# Patient Record
Sex: Male | Born: 1980 | Race: Black or African American | Hispanic: No | Smoking: Current some day smoker
Health system: Southern US, Community
[De-identification: ages and names within clinical notes are randomized; demographics above are authoritative.]

## PROBLEM LIST (undated history)

## (undated) ENCOUNTER — Emergency Department (HOSPITAL_COMMUNITY): Discharge status: Absent without leave | Payer: No Typology Code available for payment source

## (undated) DIAGNOSIS — M1990 Unspecified osteoarthritis, unspecified site: Secondary | ICD-10-CM

## (undated) DIAGNOSIS — G4730 Sleep apnea, unspecified: Secondary | ICD-10-CM

## (undated) DIAGNOSIS — E119 Type 2 diabetes mellitus without complications: Secondary | ICD-10-CM

## (undated) DIAGNOSIS — E7800 Pure hypercholesterolemia, unspecified: Secondary | ICD-10-CM

## (undated) DIAGNOSIS — J189 Pneumonia, unspecified organism: Secondary | ICD-10-CM

## (undated) DIAGNOSIS — I10 Essential (primary) hypertension: Secondary | ICD-10-CM

## (undated) DIAGNOSIS — I639 Cerebral infarction, unspecified: Secondary | ICD-10-CM

## (undated) HISTORY — PX: KNEE SURGERY: SHX244

## (undated) HISTORY — PX: FOOT SURGERY: SHX648

## (undated) HISTORY — PX: ORTHOPEDIC SURGERY: SHX850

## (undated) HISTORY — PX: SHOULDER SURGERY: SHX246

## (undated) HISTORY — PX: ORIF RADIUS & ULNA FRACTURES: SHX2129

## (undated) HISTORY — PX: ORIF TIBIA & FIBULA FRACTURES: SHX2131

---
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
## 1998-11-19 ENCOUNTER — Encounter: Payer: Self-pay | Admitting: Emergency Medicine

## 1998-11-19 ENCOUNTER — Emergency Department (HOSPITAL_COMMUNITY): Admission: EM | Admit: 1998-11-19 | Discharge: 1998-11-19 | Payer: Self-pay | Admitting: Emergency Medicine

## 2002-02-13 HISTORY — PX: ORIF TIBIA & FIBULA FRACTURES: SHX2131

## 2002-02-13 HISTORY — PX: KNEE SURGERY: SHX244

## 2002-02-13 HISTORY — PX: ORIF RADIUS & ULNA FRACTURES: SHX2129

## 2002-02-13 HISTORY — PX: FOOT SURGERY: SHX648

## 2007-09-08 ENCOUNTER — Emergency Department (HOSPITAL_COMMUNITY): Admission: EM | Admit: 2007-09-08 | Discharge: 2007-09-08 | Payer: Self-pay | Admitting: Emergency Medicine

## 2008-04-21 ENCOUNTER — Emergency Department (HOSPITAL_COMMUNITY): Admission: EM | Admit: 2008-04-21 | Discharge: 2008-04-21 | Payer: Self-pay | Admitting: Family Medicine

## 2008-05-06 ENCOUNTER — Emergency Department (HOSPITAL_COMMUNITY): Admission: EM | Admit: 2008-05-06 | Discharge: 2008-05-06 | Payer: Self-pay | Admitting: Emergency Medicine

## 2008-05-08 ENCOUNTER — Ambulatory Visit: Payer: Self-pay | Admitting: Pulmonary Disease

## 2008-05-08 ENCOUNTER — Inpatient Hospital Stay (HOSPITAL_COMMUNITY): Admission: EM | Admit: 2008-05-08 | Discharge: 2008-05-15 | Payer: Self-pay | Admitting: Emergency Medicine

## 2008-05-09 ENCOUNTER — Encounter (INDEPENDENT_AMBULATORY_CARE_PROVIDER_SITE_OTHER): Payer: Self-pay | Admitting: Pulmonary Disease

## 2008-05-15 ENCOUNTER — Emergency Department (HOSPITAL_COMMUNITY): Admission: EM | Admit: 2008-05-15 | Discharge: 2008-05-15 | Payer: Self-pay | Admitting: Emergency Medicine

## 2008-05-31 ENCOUNTER — Emergency Department (HOSPITAL_COMMUNITY): Admission: EM | Admit: 2008-05-31 | Discharge: 2008-05-31 | Payer: Self-pay | Admitting: *Deleted

## 2008-11-06 ENCOUNTER — Emergency Department (HOSPITAL_COMMUNITY): Admission: EM | Admit: 2008-11-06 | Discharge: 2008-11-06 | Payer: Self-pay | Admitting: Emergency Medicine

## 2009-04-06 ENCOUNTER — Emergency Department (HOSPITAL_COMMUNITY): Admission: EM | Admit: 2009-04-06 | Discharge: 2009-04-06 | Payer: Self-pay | Admitting: Emergency Medicine

## 2009-06-24 ENCOUNTER — Emergency Department (HOSPITAL_COMMUNITY): Admission: EM | Admit: 2009-06-24 | Discharge: 2009-06-24 | Payer: Self-pay | Admitting: Family Medicine

## 2009-08-13 ENCOUNTER — Emergency Department (HOSPITAL_COMMUNITY): Admission: EM | Admit: 2009-08-13 | Discharge: 2009-08-13 | Payer: Self-pay | Admitting: Family Medicine

## 2010-02-13 DIAGNOSIS — G459 Transient cerebral ischemic attack, unspecified: Secondary | ICD-10-CM

## 2010-02-13 DIAGNOSIS — I639 Cerebral infarction, unspecified: Secondary | ICD-10-CM

## 2010-02-13 HISTORY — DX: Transient cerebral ischemic attack, unspecified: G45.9

## 2010-02-13 HISTORY — DX: Cerebral infarction, unspecified: I63.9

## 2010-05-21 ENCOUNTER — Inpatient Hospital Stay (INDEPENDENT_AMBULATORY_CARE_PROVIDER_SITE_OTHER)
Admission: RE | Admit: 2010-05-21 | Discharge: 2010-05-21 | Disposition: A | Discharge status: Home / Self Care | Payer: Self-pay | Source: Ambulatory Visit | Attending: Family Medicine | Admitting: Family Medicine

## 2010-05-21 ENCOUNTER — Ambulatory Visit (INDEPENDENT_AMBULATORY_CARE_PROVIDER_SITE_OTHER): Payer: Self-pay

## 2010-05-21 DIAGNOSIS — S91109A Unspecified open wound of unspecified toe(s) without damage to nail, initial encounter: Secondary | ICD-10-CM

## 2010-05-25 LAB — BASIC METABOLIC PANEL
CO2: 31 mEq/L (ref 19–32)
Chloride: 97 mEq/L (ref 96–112)
Creatinine, Ser: 1.12 mg/dL (ref 0.4–1.5)
GFR calc Af Amer: >60 mL/min (ref >60)
Glucose, Bld: 106 mg/dL — ABNORMAL HIGH (ref 70–99)

## 2010-05-25 LAB — CBC
MCHC: 34 g/dL (ref 30.0–36.0)
MCHC: 34.4 g/dL (ref 30.0–36.0)
MCV: 84.9 fL (ref 78.0–100.0)
MCV: 86.3 fL (ref 78.0–100.0)
Platelets: 326 10*3/uL (ref 150–400)
RBC: 4.84 MIL/uL (ref 4.22–5.81)
RDW: 14.1 % (ref 11.5–15.5)
WBC: 11.2 10*3/uL — ABNORMAL HIGH (ref 4.0–10.5)

## 2010-05-26 LAB — CSF CULTURE W GRAM STAIN: Culture: NO GROWTH

## 2010-05-26 LAB — LEGIONELLA PROFILE(CULTURE+DFA/SMEAR): Legionella Antigen (DFA): NEGATIVE

## 2010-05-26 LAB — CBC
HCT: 42 % (ref 39.0–52.0)
HCT: 42.8 % (ref 39.0–52.0)
Hemoglobin: 13.8 g/dL (ref 13.0–17.0)
Hemoglobin: 14 g/dL (ref 13.0–17.0)
Hemoglobin: 14.4 g/dL (ref 13.0–17.0)
Hemoglobin: 14.5 g/dL (ref 13.0–17.0)
MCHC: 33.9 g/dL (ref 30.0–36.0)
MCHC: 34.3 g/dL (ref 30.0–36.0)
Platelets: 278 10*3/uL (ref 150–400)
Platelets: 297 10*3/uL (ref 150–400)
RBC: 4.74 MIL/uL (ref 4.22–5.81)
RBC: 4.88 MIL/uL (ref 4.22–5.81)
RBC: 4.89 MIL/uL (ref 4.22–5.81)
RBC: 4.9 MIL/uL (ref 4.22–5.81)
RBC: 5.34 MIL/uL (ref 4.22–5.81)
RDW: 13.8 % (ref 11.5–15.5)
RDW: 14.1 % (ref 11.5–15.5)
WBC: 14.6 10*3/uL — ABNORMAL HIGH (ref 4.0–10.5)
WBC: 14.7 10*3/uL — ABNORMAL HIGH (ref 4.0–10.5)
WBC: 25.1 10*3/uL — ABNORMAL HIGH (ref 4.0–10.5)

## 2010-05-26 LAB — BLOOD GAS, ARTERIAL
Acid-base deficit: 0.5 mmol/L (ref 0.0–2.0)
TCO2: 24.5 mmol/L (ref 0–100)
pCO2 arterial: 36.5 mmHg (ref 35.0–45.0)
pO2, Arterial: 48.5 mmHg — ABNORMAL LOW (ref 80.0–100.0)

## 2010-05-26 LAB — EXPECTORATED SPUTUM ASSESSMENT W GRAM STAIN, RFLX TO RESP C

## 2010-05-26 LAB — COMPREHENSIVE METABOLIC PANEL
Alkaline Phosphatase: 76 U/L (ref 39–117)
BUN: 7 mg/dL (ref 6–23)
GFR calc non Af Amer: >60 mL/min (ref >60)
Glucose, Bld: 107 mg/dL — ABNORMAL HIGH (ref 70–99)
Potassium: 3.8 mEq/L (ref 3.5–5.1)
Total Protein: 6.4 g/dL (ref 6.0–8.3)

## 2010-05-26 LAB — CULTURE, BLOOD (ROUTINE X 2)

## 2010-05-26 LAB — PROTIME-INR: INR: 1.1 (ref 0.00–1.49)

## 2010-05-26 LAB — HSV PCR
HSV 2 , PCR: NOT DETECTED
HSV, PCR: NOT DETECTED

## 2010-05-26 LAB — GRAM STAIN

## 2010-05-26 LAB — CULTURE, ROUTINE-ABSCESS: Culture: NO GROWTH

## 2010-05-26 LAB — BASIC METABOLIC PANEL
CO2: 28 mEq/L (ref 19–32)
CO2: 30 mEq/L (ref 19–32)
Calcium: 8.8 mg/dL (ref 8.4–10.5)
Calcium: 8.8 mg/dL (ref 8.4–10.5)
Calcium: 8.9 mg/dL (ref 8.4–10.5)
Creatinine, Ser: 1.03 mg/dL (ref 0.4–1.5)
Creatinine, Ser: 1.09 mg/dL (ref 0.4–1.5)
GFR calc Af Amer: >60 mL/min (ref >60)
GFR calc Af Amer: >60 mL/min (ref >60)
GFR calc Af Amer: >60 mL/min (ref >60)
GFR calc non Af Amer: >60 mL/min (ref >60)
Sodium: 137 mEq/L (ref 135–145)

## 2010-05-26 LAB — URINALYSIS, ROUTINE W REFLEX MICROSCOPIC
Glucose, UA: NEGATIVE mg/dL
Leukocytes, UA: NEGATIVE
Specific Gravity, Urine: 1.024 (ref 1.005–1.030)
pH: 8 (ref 5.0–8.0)

## 2010-05-26 LAB — URINE MICROSCOPIC-ADD ON

## 2010-05-26 LAB — APTT: aPTT: 39 seconds — ABNORMAL HIGH (ref 24–37)

## 2010-05-26 LAB — ANAEROBIC CULTURE

## 2010-05-26 LAB — CSF CELL COUNT WITH DIFFERENTIAL
Lymphs, CSF: 56 % (ref 40–80)
Monocyte-Macrophage-Spinal Fluid: 38 % (ref 15–45)
RBC Count, CSF: 1 /mm3 — ABNORMAL HIGH
Tube #: 3

## 2010-05-26 LAB — MAGNESIUM: Magnesium: 1.9 mg/dL (ref 1.5–2.5)

## 2010-05-26 LAB — CULTURE, RESPIRATORY W GRAM STAIN

## 2010-05-26 LAB — HEPARIN LEVEL (UNFRACTIONATED): Heparin Unfractionated: <0.1 IU/mL — ABNORMAL LOW (ref 0.30–0.70)

## 2010-05-26 LAB — VANCOMYCIN, TROUGH: Vancomycin Tr: 8 ug/mL — ABNORMAL LOW (ref 10.0–20.0)

## 2010-05-26 LAB — BRAIN NATRIURETIC PEPTIDE: Pro B Natriuretic peptide (BNP): 208 pg/mL — ABNORMAL HIGH (ref 0.0–100.0)

## 2010-06-28 NOTE — Consult Note (Signed)
NAME:  [name] [name], [name] [name]            ACCOUNT NO.:  [id number]   MEDICAL RECORD NO.:  [id number]          PATIENT TYPE:  INP   LOCATION:  6705                         FACILITY:  [facility]   PHYSICIAN:  Cherylynn Ridges, M.D.    DATE OF BIRTH:  [birth year]   DATE OF CONSULTATION:  05/08/2008  DATE OF DISCHARGE:                                 CONSULTATION   CONSULTING SURGEON:  Dr. Lindie Spruce.   REQUESTING PHYSICIAN:  Charlestine Massed, MD, [facility].   REASON FOR CONSULTATION:  Right groin abscess.   HISTORY PRESENT ILLNESS:  [name] [name] is a 30 year old male patient  with history of sleep apnea and obesity.  He has recently been admitted  in the past 24 hours with complaints of nausea, vomiting, sepsis, back  pain, and plans to rule out meningitis.  In addition, he reports a 4-day  history of right groin pain with redness and fullness area that began  like a small pimple.  He had progressive increase in symptoms including  increasing pain and swelling in the right groin scrotal and perineal  area.  The patient was empirically placed on vancomycin and Rocephin to  cover possible sepsis and meningitis.  In the interim, in the past 24  hours, he has had a T-max of 102 degrees fever despite being on  antibiotics, and he has begun having spontaneous purulent drainage from  the perineum.  When he was admitted, his white count was 25,000 and  despite antibiotics he is up to 26,000.  Today, he continues with fever  and increased pain, and he is having persistent nausea, vomiting, and  has been unable to eat for 24 hours.  Surgical consultation has been  requested.   REVIEW OF SYSTEMS:  As per the history of present illness.  CONSTITUTIONAL:  The patient has never had symptoms like this before.  Otherwise, all review of system categories are negative or  noncontributory to this exam.   PAST MEDICAL HISTORY:  Sleep apnea, currently not on nocturnal CPAP.   PAST SURGICAL HISTORY:  The  patient reports several years ago, repair of  shattered left wrist and ankle, and reports it was related to sports  injury.   SOCIAL HISTORY:  Admits to tobacco use, rare social alcohol.  He is  married.  He has children and he works as a Optometrist.   ALLERGIES:  NKDA.   CURRENT MEDICATIONS SINCE ARRIVAL:  Morphine, Tylenol, Zofran, Protonix,  Senokot, vancomycin, ceftriaxone, and various other medications for  symptom management.   PHYSICAL EXAMINATION:  GENERAL:  Mildly toxic-appearing male patient,  somewhat lethargic, complaining of severe right groin and scrotal pain.  VITAL SIGNS:  T-max today, 102.4, current temperature is 100.3; BP is  136/85; pulse is 101 and regular; respirations are 20.  PSYCH:  The patient is arousable and alert when stimulated, but is very  groggy due to fever and pain.  His affect is appropriate given current  situation.  NEUROLOGIC:  Cranial nerves II through XII are grossly intact.  He is  moving all extremities x4.  No obvious focal neurological deficits.  EYES:  Sclerae  are slightly injected.  EARS, NOSE, AND THROAT:  Ears are symmetric with no otorrhea.  Nose is  midline.  No rhinorrhea.  Oral mucous membranes are pink, but dry.  CHEST:  Bilateral lung sounds are clear to auscultation, diminished in  the bases.  Respiratory effort is nonlabored.  He is on room air.  CARDIOVASCULAR:  Heart sounds are S1 and S2 without obvious rubs,  murmurs, thrills, or gallops.  No JVD.  No peripheral edema.  ABDOMEN:  Obese and soft, and nontender.  Bowel sounds are present.  No  obvious hepatosplenomegaly masses, bruits, or hernias.  GENITOURINARY:  The patient's external genitalia are normal in  appearance, except for the following.  In the right groin area, there is  significant cellulitic and redness skin changes with fluctuance, this  extends down towards the scrotum, although the fluctuance and the  swelling is less noticed in the scrotum.  The scrotum  itself is  exquisitely tender with manipulation.  There is induration in the  perineal area as well as tan-purulent drainage coming out of a tiny  pinhole spontaneous opening.  Erythema also extends down into the  scrotal and perineal areas.  He is exquisitely tender again with  palpation over these areas, especially in the scrotal region.  EXTREMITIES:  Symmetrical in appearance without cyanosis or clubbing.   LABORATORY DATA:  White count 26,000, hemoglobin 14.5, and platelets  215,000.  Sodium 136, potassium 3.8, CO2 of 23, glucose 107, BUN 7, and  creatinine 1.08.  LFTs are normal.   DIAGNOSTICS:  The patient underwent a CT of the abdomen and pelvis,  which in regards to the abscess process basically only shows cellulitic  changes in the soft tissue in the right groin area with no obvious fluid  or abscess collection.   IMPRESSION:  1. 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  2. Sleep apnea.   PLAN:  1. Dr. Lindie Spruce has interviewed and examined the patient.  It is      imperative patient is sent to the OR today for incision and      drainage of this above-mentioned area.  2. The patient is mainly on Gram-positive coverage.  We will go ahead      and add Gram-negative coverage with Flagyl, and postoperatively,      pending findings we may adjust antibiotics and/or await for any      cultures obtained in the intraoperative setting.  3. The patient has been made n.p.o.  We will go ahead and make sure he      has IV Dilaudid ordered while he is n.p.o.       Allison L. Rennis Harding, N.P.      Cherylynn Ridges, M.D.  Electronically Signed   ALE/MEDQ  D:  05/08/2008  T:  05/09/2008  Job:  [id number]

## 2010-06-28 NOTE — H&P (Signed)
NAME:  [name] [name], [name] [name]            ACCOUNT NO.:  [id number]   MEDICAL RECORD NO.:  [id number]          PATIENT TYPE:  INP   LOCATION:  6705                         FACILITY:  [facility]   PHYSICIAN:  Vania Rea, M.D. DATE OF BIRTH:  06/16/1980   DATE OF ADMISSION:  05/08/2008  DATE OF DISCHARGE:                              HISTORY & PHYSICAL   PRIMARY CARE PHYSICIAN:  Unassigned.   CHIEF COMPLAINT:  Headache and back pain.   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  The patient visited Urgent Care  where he was diagnosed with upper respiratory symptoms, apparently  seasonal allergies.  Was given doxycycline and sent home, but has been  getting progressively worse.  Returned to the emergency room today and  was noted to be having a low-grade fever and had a small scrotal  abscess.  The emergency room physician went ahead and drained the  scrotal abscess and consulted hospitalist service for sepsis of unknown  cause.  Currently, the patient is complaining of severe headache which  he says may be related to his migraine but 10 times worse.  Also,  photophobia.  He had no neck stiffness.  He vomited Tylenol which he has  been given and continues to complain of low back pain.  He denied any  neurological deficits.  Denied any contacts with sick persons.  He no  longer has diarrhea.   PAST MEDICAL HISTORY:  Nothing significant.   MEDICATIONS:  1. Doxycycline twice daily, recently prescribed.  2. Tramadol p.r.n. for recent broken tooth.  3. Tylenol #3 p.r.n.   ALLERGIES:  NO KNOWN DRUG ALLERGIES.   SOCIAL HISTORY:  Smokes one cigar every 2-3 days, alcohol occasionally.  Denies illicit drug use.  Former [employer] in high school and  college.   FAMILY HISTORY:  Significant  only for asthma, but otherwise, denied any  family history of any medical problems.   REVIEW OF SYSTEMS:  Other than noted above, a 10-point review of systems  is unremarkable.  Denies dyspnea or frequency.   PHYSICAL EXAMINATION:  GENERAL:  Obese, young, African American  gentleman, lying flat in the bed, clearly distressed by pain, headache,  complaining of girth.  VITAL SIGNS:  Temperature is now 102.8, pulse 104, respirations 20,  blood pressure 154/96.  He is saturating 99% on room air.  HEENT:  Pupils are round and equal.  He does have photophobia.  He has  no neck stiffness.  No cervical lymphadenopathy or thyromegaly.  Ear  drum is normal.  It is not inflamed.  CHEST:  Clear to auscultation bilaterally.  CARDIOVASCULAR:  Tachycardic.  No murmurs.  ABDOMEN:  Massively obese, but soft and nontender.  In his groin area,  he has extensive inflammation in the right perineal area between the  thigh and the scrotum, and he is status post drainage of a small pimple  in the right scrotum.  He has no  penile discharge.  EXTREMITIES:  Without edema.  He has 3+ dorsalis pedis pulses  bilaterally.  CNS:  Cranial nerves II-XII grossly intact.  No focal lateralizing  signs.   LABORATORY DATA:  White count is 25,000, hemoglobin 15.6, platelets 235.  Serum chemistry remarkable for a sodium slightly low at 133, potassium  3.6, chloride 101, CO2 23, glucose 124, BUN 7, creatinine 1.09.  Urinalysis is unremarkable.  Chest shows minimal chronic bronchitic  changes.  No acute abnormalities.   ASSESSMENT:  1. Sepsis.  2. Questionable acute meningitis.  3. Possible spinal abscess versus diskitis.  4. Scrotal abscess status post drainage.   PLAN:  Will get a septic workup including blood cultures.  The patient  has been ordered a pelvic CT by the emergency room physician.  Since he  will be getting a contrast, we will add lumbar CT to look for spinal  abscess, but in all likelihood in this  gentleman, if the CT is negative  we will need a lumbar puncture, and if that is negative, an MRI of the  spine.  In any case, we will start him on CNS doses of Rocephin and  vancomycin.  Other plans as per orders.      Vania Rea, M.D.  Electronically Signed     LC/MEDQ  D:  05/08/2008  T:  05/08/2008  Job:  [id number]

## 2010-06-28 NOTE — Group Therapy Note (Signed)
NAME:  [name], [name]            ACCOUNT NO.:  [id number]   MEDICAL RECORD NO.:  [id number]          PATIENT TYPE:  INP   LOCATION:  4742                         FACILITY:  [facility]   PHYSICIAN:  Charlestine Massed, MDDATE OF BIRTH:  04-08-1980                                 PROGRESS NOTE   PRIMARY CARE PHYSICIAN:  Unassigned.   REASON FOR ADMISSION:  Headache and pain in the right groin.   CURRENT DIAGNOSIS:  1. Right groin abscess.  2. Sepsis secondary to abscess and pneumonia.  3. Bilateral pneumonia with rusty sputum.  4. Morbid obesity with questionable obstructive sleep apnea and early      morning headache and daytime somnolence.  5. Hypoxemia secondary to pneumonia.  6. Acute pulmonary edema with high blood pressure yesterday, one      episode, currently resolved.   CURRENT MEDICATIONS:  1. Azithromycin 5 mg once daily.  2. Rocephin 1 gram once daily.  3. Lasix 20 mg IV q.8 hours.  4. Protonix 40 mg daily.  5. Senna 1 tablet p.o. at bedtime.  6. Tylenol No. 3 two tablets p.o. q.6 hours p.r.n.  7. Flagyl 500 mg q.8 hours.  8. Heparin for DVT prophylaxis.   HOSPITAL COURSE BY PROBLEMS:  1. Right groin abscess.  The patient came in with complaints of pain      in the right groin.  Initially the abscess was drained in the ER by      the ED physician.  He also had complaints of photophobia and      headache; so, the patient was initially started on IV antibiotics      with IV vancomycin.  The next day when I saw the patient, I called      surgery.  Surgery took the patient to the OR for further drainage.      They found that the patient has a perineal abscess with sepsis and      incision and drainage of the necrotizing soft infection of the      right scrotum was done.  They opinioned that the patient possibly      has a Fournier's cellulitis with abscess which was drained and a      gauze dressing was placed.  The patient was followed up by surgery      today and  they plan for re-exploration of the abscess as the      patient is continuing to have more drainage at the wound.  They      plan for further re-exploration tomorrow in the a.m.  He was      continued on antibiotics.  2. Meningitis ruled out.  The patient came with report of a morning      headache, was suspected to have meningitis and had a lumbar      puncture done which with only 1 WBC in the count; so, the patient      was ruled out for meningitis and droplet precautions removed.  3. Pneumonia.  The patient developed onset of shortness of breath with      cough and rusty sputum and was  hypoxic yesterday morning; so, the      patient was transferred to the ICU wanted to like to shortness of      breath and was put on high flow oxygen.  The  hypoxemia improved      and was he was continued on IV antibiotics of Rocephin and started      on Zithromax also and continued on other antibiotics.  The patient      has improved considerably and has been transferred out of the ICU.      CCU and pulmonary system consult was done.  There is no need for      BiPAP and he will continue on nasal oxygen, continue on IV      antibiotics and Pneumovax and flu vaccine to be given at the time      of discharge.  4. Morbid obesity.  The patient currently has morbid obesity.  He has      possible obstructive sleep apnea as he is giving signs and signs of      morning headaches and daytime somnolence.  The patient needs to      have a sleep study as an outpatient to be qualified for CPAP      machine at home.  The patient may also benefit from bariatric      surgery which needs to be referred after the patient is discharged.      Currently, we will put the patient on the Pap at night to see if      that resolves his headache and he needs to be referred to      Pulmonology at the time of discharge.  5. Acute pulmonary edema on chest x-rays today with very high blood      pressures recorded yesterday.  The  patient had one episode of high      blood pressure.  Currently, the patient is continued on Lasix 20 mg      q.8 hours for his pulmonary edema.  It has resolved considerably.      The patient's blood pressure needs to be controlled with      antihypertensives.   DISPOSITION:  1. Currently, the patient is awaiting and surgical exploration of the      groin abscess tomorrow.  2. PE has been ruled out and off heparin drip.  3. Continue antibiotics and follow surgery advice with regards to the      abscess.      Charlestine Massed, MD  Electronically Signed     UT/MEDQ  D:  05/10/2008  T:  05/10/2008  Job:  [id number]

## 2010-06-28 NOTE — Op Note (Signed)
NAME:  [name] [name], [name] [name]            ACCOUNT NO.:  [id number]   MEDICAL RECORD NO.:  [id number]          PATIENT TYPE:  INP   LOCATION:  6705                         FACILITY:  [facility]   PHYSICIAN:  Cherylynn Ridges, M.D.    DATE OF BIRTH:  11-13-1980   DATE OF PROCEDURE:  05/08/2008  DATE OF DISCHARGE:                               OPERATIVE REPORT   PREOPERATIVE DIAGNOSIS:  Perineal abscess with sepsis.   POSTOPERATIVE DIAGNOSIS:  Right scrotal and perineal abscess with  necrotizing soft tissue infection.   PROCEDURE:  Incision and drainage of right scrotal and perineal abscess.   SURGEON:  Cherylynn Ridges, M.D.   ANESTHESIA:  General endotracheal.   ESTIMATED BLOOD LOSS:  Less than 20 mL.   COMPLICATIONS:  None.   CONDITION:  Stable.   FINDINGS:  Necrotizing soft tissue infection of the right scrotum and  perineum.   INDICATION FOR OPERATION:  The patient is an otherwise healthy 30-year-  old large male with fevers, chills, white count of 26,000 who comes in  now with draining abscess of his right groin and scrotum.   OPERATION:  The patient was taken to the operating room, placed on table  in supine  position.  After an adequate general endotracheal anesthetic  was administered, he was placed in the lithotomy position and prepped  and draped in usual sterile manner with scrotum taped to the left thigh  to expose the draining right scrotal and perineal abscess.   The opening of the draining sinus was in the posterior aspect and  lateral wall of the scrotum.  It extended up to the mons portion of the  pubis.  We opened this area to about 1-1/2 inches and then bluntly  opened up the soft tissue infection, draining a large amount of bloody,  watery, light-brown pus, which was foul smelling.  We irrigated it with  about 500 mL of saline then we packed it with an entire bottle of 1 inch  iodoform gauze and entire bottle of 0.5 inch iodoform gauze.  Cultures  were sent, aerobic and  anaerobic.  Dressings were applied including 4 x  4's and ABDs.  All counts were correct.      Cherylynn Ridges, M.D.  Electronically Signed    JOW/MEDQ  D:  05/08/2008  T:  05/09/2008  Job:  [id number]

## 2010-06-28 NOTE — Discharge Summary (Signed)
NAME:  [name], [name]            ACCOUNT NO.:  [id number]   MEDICAL RECORD NO.:  [id number]           PATIENT TYPE:   LOCATION:                                 FACILITY:   PHYSICIAN:  Beckey Rutter, MD  DATE OF BIRTH:  25-Apr-1980   DATE OF ADMISSION:  DATE OF DISCHARGE:                               DISCHARGE SUMMARY   Please refer to the previously dictated discharge summary for more  details regarding hospital course.   The patient is status post second drainage by surgical team.  Today, the  patient was cleared for discharge.  I will discharge the patient on pain  medication as well as antibiotic.   DISCHARGE DIAGNOSIS:  Please refer to the previously dictated discharge  summary for discharge diagnosis.   DISCHARGE MEDICATIONS:  1. Hydromorphone/Dilaudid 2-4 mg p.o. b.i.d.  2. Colace 1 tablet p.o. nightly, hold for loose stool.  3. Percocet 2 tablets p.o. q.6 h. p.r.n.  4. Advair 1 puff b.i.d.  5. Augmentin 875 mg p.o. b.i.d. for 5 more days.   The patient is stable for discharge today.  He should follow up with  [facility] as indicated and discussed with him.  The  patient will have home health RN for daily dressing.      Beckey Rutter, MD  Electronically Signed     EME/MEDQ  D:  05/15/2008  T:  05/16/2008  Job:  [id number]

## 2010-06-28 NOTE — Op Note (Signed)
NAME:  Hulbert, Bhavik            ACCOUNT NO.:  [id number]   MEDICAL RECORD NO.:  [id number]          PATIENT TYPE:  INP   LOCATION:  5002                         FACILITY:  [facility]   PHYSICIAN:  Lennie Muckle, MD      DATE OF BIRTH:  Mar 30, 1980   DATE OF PROCEDURE:  05/11/2008  DATE OF DISCHARGE:                               OPERATIVE REPORT   PREOPERATIVE DIAGNOSIS:  Right groin abscess.   POSTOPERATIVE DIAGNOSIS:  Right groin abscess.   PROCEDURE:  Dressing change under anesthesia, debridement of necrotic  tissue and extension of an incision of abscess cavity.   BLOOD LOSS:  Minimal.   DRAINS:  No drains were placed.   Wound was packed with gauze.   INDICATION FOR PROCEDURE:  Mr. Reesor is a 30 year old male who  performed incision and drainage of a right groin abscess by Dr. Jimmye Norman on May 08, 2008.  There was purulent fluid expressed.  This was  deeply packed with iodoform gauze.  He continued to have some drainage  from the area and having pain.  Therefore, it was discussed with the  patient to perform further extension on decision and debridement of  tissues as needed.  Informed consent was obtained prior to procedure.   DETAILS OF PROCEDURE:  Mr. Sar was identified in preoperative  holding area and marked as right groin.  All questions were answered, we  then taken him to the operating room where he was placed in the supine  position.  After administration of general endotracheal anesthesia, he  was placed in lithotomy position.  His right groin and scrotum were  prepped and draped in usual sterile fashion.  A time-out procedure  indicating patient and procedure were performed.  I removed the packing,  there was some exudative material noted.  The wound tracked  approximately 10-12 cm superiorly.  Using a #10 blade, I opened the  incision superiorly.  Bleeding was controlled with electrocautery.  Continued opening the wound bed and investigating the  wound, a small  amount of necrotic area was debrided with the electrocautery.  A small  bleeding vessel was controlled with the 3-0 Vicryl suture.  I irrigated  the wound bed, it seemed like it needed no further debridement and  therefore I packed it with a moist gauze.  He was then extubated and  transferred to postanesthesia care unit in stable condition.  They will  need twice daily dressing changes and pulse lavage daily.  Continue to  monitor his wound and take to the operating room as needed.      Lennie Muckle, MD  Electronically Signed    ALA/MEDQ  D:  05/11/2008  T:  05/12/2008  Job:  [id number]

## 2010-07-18 ENCOUNTER — Inpatient Hospital Stay (HOSPITAL_COMMUNITY)
Admission: EM | Admit: 2010-07-18 | Discharge: 2010-07-21 | DRG: 305 | Disposition: A | Discharge status: Home / Self Care | Payer: Medicaid Other | Attending: Internal Medicine | Admitting: Internal Medicine

## 2010-07-18 DIAGNOSIS — I10 Essential (primary) hypertension: Principal | ICD-10-CM | POA: Diagnosis present

## 2010-07-18 DIAGNOSIS — E871 Hypo-osmolality and hyponatremia: Secondary | ICD-10-CM | POA: Diagnosis present

## 2010-07-18 DIAGNOSIS — R55 Syncope and collapse: Secondary | ICD-10-CM | POA: Diagnosis present

## 2010-07-18 DIAGNOSIS — E785 Hyperlipidemia, unspecified: Secondary | ICD-10-CM | POA: Diagnosis present

## 2010-07-18 DIAGNOSIS — R799 Abnormal finding of blood chemistry, unspecified: Secondary | ICD-10-CM | POA: Diagnosis present

## 2010-07-18 DIAGNOSIS — G43909 Migraine, unspecified, not intractable, without status migrainosus: Secondary | ICD-10-CM | POA: Diagnosis present

## 2010-07-18 DIAGNOSIS — G459 Transient cerebral ischemic attack, unspecified: Secondary | ICD-10-CM | POA: Diagnosis present

## 2010-07-18 DIAGNOSIS — F17200 Nicotine dependence, unspecified, uncomplicated: Secondary | ICD-10-CM | POA: Diagnosis present

## 2010-07-19 ENCOUNTER — Inpatient Hospital Stay (HOSPITAL_COMMUNITY): Payer: Medicaid Other

## 2010-07-19 ENCOUNTER — Encounter (HOSPITAL_COMMUNITY): Payer: Self-pay

## 2010-07-19 ENCOUNTER — Emergency Department (HOSPITAL_COMMUNITY): Payer: Medicaid Other

## 2010-07-19 LAB — CK TOTAL AND CKMB (NOT AT ARMC)
CK, MB: 1.4 ng/mL (ref 0.3–4.0)
Relative Index: 0.7 (ref 0.0–2.5)
Total CK: 211 U/L (ref 7–232)

## 2010-07-19 LAB — BASIC METABOLIC PANEL
BUN: 10 mg/dL (ref 6–23)
CO2: 24 mEq/L (ref 19–32)
Chloride: 99 mEq/L (ref 96–112)
GFR calc non Af Amer: >60 mL/min (ref >60)
Glucose, Bld: 126 mg/dL — ABNORMAL HIGH (ref 70–99)
Potassium: 4.3 mEq/L (ref 3.5–5.1)
Sodium: 134 mEq/L — ABNORMAL LOW (ref 135–145)

## 2010-07-19 LAB — CBC
HCT: 44.8 % (ref 39.0–52.0)
MCH: 27.4 pg (ref 26.0–34.0)
MCV: 83 fL (ref 78.0–100.0)
RDW: 14 % (ref 11.5–15.5)
WBC: 9.6 10*3/uL (ref 4.0–10.5)

## 2010-07-19 LAB — URINALYSIS, ROUTINE W REFLEX MICROSCOPIC
Glucose, UA: NEGATIVE mg/dL
Hgb urine dipstick: NEGATIVE
Ketones, ur: NEGATIVE mg/dL
pH: 5 (ref 5.0–8.0)

## 2010-07-19 LAB — RAPID URINE DRUG SCREEN, HOSP PERFORMED
Amphetamines: NOT DETECTED
Barbiturates: NOT DETECTED
Benzodiazepines: NOT DETECTED
Cocaine: NOT DETECTED

## 2010-07-19 LAB — CARDIAC PANEL(CRET KIN+CKTOT+MB+TROPI)
CK, MB: 1.4 ng/mL (ref 0.3–4.0)
Total CK: 201 U/L (ref 7–232)
Troponin I: <0.3 ng/mL (ref <0.30)

## 2010-07-19 LAB — LIPID PANEL
Total CHOL/HDL Ratio: 5 RATIO
VLDL: 22 mg/dL (ref 0–40)

## 2010-07-19 LAB — DIFFERENTIAL
Eosinophils Absolute: 0.3 10*3/uL (ref 0.0–0.7)
Eosinophils Relative: 3 % (ref 0–5)
Lymphocytes Relative: 30 % (ref 12–46)
Lymphs Abs: 2.9 10*3/uL (ref 0.7–4.0)
Monocytes Relative: 10 % (ref 3–12)

## 2010-07-19 LAB — TROPONIN I: Troponin I: <0.3 ng/mL (ref <0.30)

## 2010-07-19 LAB — PRO B NATRIURETIC PEPTIDE: Pro B Natriuretic peptide (BNP): 5 pg/mL (ref 0–125)

## 2010-07-20 ENCOUNTER — Inpatient Hospital Stay (HOSPITAL_COMMUNITY): Payer: Medicaid Other

## 2010-07-20 DIAGNOSIS — R55 Syncope and collapse: Secondary | ICD-10-CM

## 2010-07-20 LAB — COMPREHENSIVE METABOLIC PANEL
ALT: 68 U/L — ABNORMAL HIGH (ref 0–53)
Alkaline Phosphatase: 83 U/L (ref 39–117)
Glucose, Bld: 95 mg/dL (ref 70–99)
Potassium: 3.6 mEq/L (ref 3.5–5.1)
Sodium: 136 mEq/L (ref 135–145)
Total Protein: 6.2 g/dL (ref 6.0–8.3)

## 2010-07-20 LAB — CBC
MCH: 27.3 pg (ref 26.0–34.0)
MCHC: 32.5 g/dL (ref 30.0–36.0)
MCV: 84.1 fL (ref 78.0–100.0)
Platelets: 223 10*3/uL (ref 150–400)
RBC: 5.16 MIL/uL (ref 4.22–5.81)
RDW: 14.2 % (ref 11.5–15.5)

## 2010-07-20 LAB — CARDIAC PANEL(CRET KIN+CKTOT+MB+TROPI)
CK, MB: 1.8 ng/mL (ref 0.3–4.0)
Troponin I: <0.3 ng/mL (ref <0.30)

## 2010-07-20 LAB — HEMOGLOBIN A1C
Hgb A1c MFr Bld: 6.3 % — ABNORMAL HIGH (ref <5.7)
Mean Plasma Glucose: 134 mg/dL — ABNORMAL HIGH (ref <117)

## 2010-07-21 ENCOUNTER — Inpatient Hospital Stay (HOSPITAL_COMMUNITY): Payer: Medicaid Other

## 2010-07-21 LAB — CBC
MCH: 27.1 pg (ref 26.0–34.0)
MCHC: 32.1 g/dL (ref 30.0–36.0)
MCV: 84.4 fL (ref 78.0–100.0)
Platelets: 239 10*3/uL (ref 150–400)
RDW: 14.1 % (ref 11.5–15.5)

## 2010-07-21 LAB — BASIC METABOLIC PANEL
BUN: 7 mg/dL (ref 6–23)
Calcium: 8.7 mg/dL (ref 8.4–10.5)
Chloride: 99 mEq/L (ref 96–112)
Creatinine, Ser: 0.91 mg/dL (ref 0.4–1.5)
GFR calc Af Amer: >60 mL/min (ref >60)
GFR calc non Af Amer: >60 mL/min (ref >60)

## 2010-07-26 NOTE — Discharge Summary (Signed)
NAME:  [name] [name], [name] [name]            ACCOUNT NO.:  [id number]  MEDICAL RECORD NO.:  [id number]  LOCATION:  1516                         FACILITY:  [facility]  PHYSICIAN:  Hillery Aldo, M.D.   DATE OF BIRTH:  1980-06-02  DATE OF ADMISSION:  07/18/2010 DATE OF DISCHARGE:  07/21/2010                              DISCHARGE SUMMARY   PRIMARY CARE PHYSICIAN:  None.  The patient is being referred to Dr. Lonia Blood for hospital followup and to establish care.  DISCHARGE DIAGNOSES: 1. 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 2. Syncope. 3. Probable transient ischemia attack related to hypertensive     emergency/accelerated hypertension. 4. Headaches. 5. Morbid obesity. 6. Mild dyslipidemia. 7. Mild elevated hemoglobin A1c/prediabetes. 8. Tobacco abuse. 9. Transient hyponatremia. 10.History of asthma.  CONSULTATIONS:  Registered dietitian, physical therapy, occupational therapy.  BRIEF ADMISSION HPI:  The patient is a 30 year old male who had a syncopal event that was witnessed by a friend.  Essentially sat down, slumped over and passed out for almost an hour.  The friend thought he was tired and was sleeping.  However, when he woke up, he was noted to have left-sided facial numbness which was persistent and therefore, he presented to the emergency department for further evaluation.  PROCEDURES AND DIAGNOSTIC STUDIES: 1. CT scan of the head on Jun 20, 2010 showed no evidence of acute     intracranial hemorrhage, mass lesion, or acute infarct. 2. Repeat CT scan of the head on Jun 20, 2010 showed no acute     intracranial abnormality. 3. Two-dimensional echocardiogram on July 19, 2010 showed moderate     concentric hypertrophy of the left ventricle with vigorous systolic     function and ejection fraction estimated at 65% to 70%.  No     regional wall motion abnormality.  Tissue Doppler parameters were     likewise normal.  No embolic  source of embolism was identified. 4. Carotid Dopplers on July 20, 2010 showed no ICA stenosis bilaterally     on the preliminary report.  Final report is pending.  DISCHARGE LABORATORY VALUES:  Sodium was 138, potassium 3.8, chloride 99, bicarb 30, BUN 7, creatinine 0.91, glucose 104, calcium 8.7.  White blood cell count was 10.2, hemoglobin 14.6, hematocrit 45.5, platelets 239,000.  Hemoglobin A1c was mildly elevated at 6.3.  Cardiac markers showed nonspecific elevation of total CK of 322 at its highest value. CK-MB and troponins were negative.  TSH was 1.691.  Lipids showed a cholesterol of 155, triglycerides 109, HDL 31, LDL 102.  Urine drug screen was positive for opiates.  HOSPITAL COURSE BY PROBLEM: 1. 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  This precipitously dropped his blood pressure and     subsequently was discontinued.  His blood pressure again trended up     and he subsequently was put on Norvasc for blood pressure control.     Blood pressures have ranged from 145 to 175 systolic over 89 to 112     diastolic, on Norvasc.  This point, the patient is stable for  discharge but needs an affordable antihypertensive regimen and     therefore will be discharged on Cardizem and hydrochlorothiazide     for blood pressure control.  He has been advised that it is     important for him to follow up with Dr. Mikeal Hawthorne for ongoing blood     pressure control management and to ensure that his blood pressure     is well-controlled on this new regimen.  He has likewise been     advised that these medications are available for 4 dollars a month     at the [facility]. 2. Syncope:  Unclear if this was truly a syncopal event or if he in     fact did fall asleep.  There is no evidence of stroke on serial CT     scans.  Unfortunately, the patient was too large to undergo MRI      screening.  He has had no further syncopal-type episodes here in     the hospital. 3. Probable TIA:  The patient's TIA was likely triggered by     hypertensive emergency.  Serial CT scans were performed since he     could not have an MRI and these were negative.  The full stroke     evaluation with carotid Dopplers and two-dimensional echocardiogram     were otherwise unrevealing.  This point, the patient will be     discharged on aspirin and blood pressure control medications as     well as lipid control medications.  He has been advised of his risk     factors for stroke and instructed that he will need to control his     risk factors to prevent further problems with stroke. 4. Headache:  Questionable migraines versus headache from high blood     pressure.  The patient was treated with a combination of Ultram and     IV pain medications.  He is requesting additional pain medication     at discharge and will be discharged with 30-tablet prescription for     5 mg oxycodone.  If he continues to have persistent headaches,     further neurological evaluation for migraine headaches may be     warranted. 5. Morbid obesity:  The patient was seen and evaluated by the     dietitian and provided dietary instruction regarding weight loss. 6. Mild dyslipidemia:  The patient was started on a statin given his     LDL greater than 100.  He has been advised that if he loses weight     and controls his diet, it is possible that he could discontinue     this medication at some point in the future.  The patient was put     on lovastatin because it is available at the [facility] for     4 dollars per month. 7. Mildly elevated hemoglobin A1c:  The patient's fasting glucoses     ranged from 95 to 104.  Hemoglobin A1c corresponds with a mean     plasma glucose of 134 and he has been advised that he is at risk to     develop overt diabetes.  He was seen by the dietitian for     recommendations  regarding dietary changes to help avoid this. 8. Mild hyponatremia:  Resolved.  This was transient and of unclear     significance. 9. Tobacco abuse:  The patient was provided with a nicotine patch and  counseled regarding tobacco cessation. 10.History of asthma:  No active issues related to this while in the     hospital.  DISPOSITION:  The patient is medically stable and will be discharged home.  DISCHARGE DIET:  Heart-healthy, low sugar.  DISCHARGE INSTRUCTIONS:  Follow up with Dr. Mikeal Hawthorne in 2 to 4 weeks.  Dr. Maxwell Caul number has been provided to the patient.  Return to the hospital to have any further neurological symptoms suggestive of stroke (these were reviewed with the patient).  Follow up with Dr. Mikeal Hawthorne for repeat blood work in 4 to 6 weeks while on lovastatin and other medications.  CONDITION ON DISCHARGE:  Improved.  Time spent coordinating care for discharge and discharge instructions equals approximately 35 minutes.     Hillery Aldo, M.D.     CR/MEDQ  D:  07/21/2010  T:  07/21/2010  Job:  [id number]  cc:   Lonia Blood, M.D.  Electronically Signed by Hillery Aldo M.D. on 07/26/2010 06:59:23 AM

## 2010-08-14 NOTE — H&P (Signed)
NAME:  [name] [name], [name] [name]            ACCOUNT NO.:  [id number]  MEDICAL RECORD NO.:  [id number]  LOCATION:  WLED                         FACILITY:  [facility]  PHYSICIAN:  Lonia Blood, M.D.      DATE OF BIRTH:  April 30, 1980  DATE OF ADMISSION:  07/18/2010 DATE OF DISCHARGE:                             HISTORY & PHYSICAL   PRIMARY CARE PHYSICIAN:  He is unassigned.  PRESENTING COMPLAINT:  Headache and facial numbness.  HISTORY OF PRESENT ILLNESS:  The patient is a 30 year old morbidly obese man with only history of obesity and asthma who apparently was dropping his friend off after they left work this evening.  He went to the bathroom in his friend's house, came back and felt tired.  He sat down, slumped over, and passed out according to the friend for almost an hour. The friend thought he was tired and was sleeping.  When he got up, he was having left-sided facial numbness, which persisted.  He denied any prior history of hypertension.  No prior history of stroke.  No nausea, vomiting, or diarrhea.  No seizure disorder and no falls.  PAST MEDICAL HISTORY: 1. Asthma. 2. Obesity.  ALLERGIES:  No known drug allergies.  MEDICATIONS:  None.  SOCIAL HISTORY:  The patient lives with his girlfriend here in [place].  He smokes about half to 1 pack per day and occasional alcohol.  No IV drug use.  FAMILY HISTORY:  Significant for asthma.  REVIEW OF SYSTEMS:  All systems reviewed are negative except per HPI.  PHYSICAL EXAMINATION:  VITAL SIGNS:  Temperature 98.6, initial blood pressure 214/136 with a pulse of 96, respiratory rate 20, sat is 96% on room air. GENERAL:  The patient is awake, alert, oriented.  He is no acute distress. HEENT:  PERRL.  EOMI.  No pallor.  No jaundice.  No rhinorrhea. NECK:  Supple.  No visible JVD.  No lymphadenopathy. RESPIRATORY:  He has good air entry bilaterally.  No wheezes.  No rales. No crackles. CARDIOVASCULAR SYSTEM:  He has S1, S2.  No audible  murmur. ABDOMEN:  Obese, soft, nontender.  Positive bowel sounds. EXTREMITIES:  No edema, cyanosis, or clubbing. SKIN:  No rashes.  No ulcer.  He has a scar on his left lower extremity around the shin area. NEUROLOGIC:  Cranial nerves II through XII seem to be intact.  The patient has some facial numbness on the left, but no evidence of motor defect.  Power is 5/5 upper and lower extremities respectively. Reflexes 2+ bilaterally.  LABORATORY DATA:  Sodium is 134, potassium 4.3, chloride 99, CO2 24, glucose 126, BUN 10, creatinine 1.13 with calcium 8.7.  White count is 9.6, hemoglobin 15.8 with platelet count of 237, and normal differentials.  PT 13.1, INR 0.97.  Urinalysis was only cloudy.  Urine drug screen is positive for some opiates only.  Head CT showed no evidence of acute intracranial hemorrhage, mass, lesion or acute infarct.  ASSESSMENT:  This is a 30 year old gentleman presenting with what appears to be a syncopal episode probably as a result of hypertensive emergency.  The patient is continuously having major headaches now on a nitroglycerin drip.  PLAN: 1. Hypertensive emergency.  We will  admit the patient to step-down     unit.  I will put him on IV labetalol instead of nitroglycerin     since he is having headaches with that.  We will also start him     with oral once his blood pressure is controlled. 2. Syncope.  Probably from hypertensive emergency.  His head CT is     negative.  Due to his age, although he smokes tobacco with his new     diagnosis of hypertension, we will control the blood pressure     mainly and see how his symptoms do.  If symptoms pursue, we may     consider MRI of the brain. 3. Migraine headaches.  I will continue to treat his migraine     symptomatically. 4. Tobacco abuse.  I will offer him some nicotine patch while in the     hospital. 5. Transient hyponatremia.  We will put him on some saline or at least     half normal saline for now to  correct it. 6. Asthma.  The patient is not in any respiratory distress.  I will     put him on empiric nebulizers as needed.     Lonia Blood, M.D.     Verlin Grills  D:  07/19/2010  T:  07/19/2010  Job:  [id number]  Electronically Signed by Lonia Blood M.D. on 08/14/2010 12:27:48 AM

## 2010-09-27 ENCOUNTER — Ambulatory Visit (INDEPENDENT_AMBULATORY_CARE_PROVIDER_SITE_OTHER): Payer: Medicaid Other

## 2010-09-27 ENCOUNTER — Inpatient Hospital Stay (INDEPENDENT_AMBULATORY_CARE_PROVIDER_SITE_OTHER)
Admission: RE | Admit: 2010-09-27 | Discharge: 2010-09-27 | Disposition: A | Discharge status: Home / Self Care | Payer: Medicaid Other | Source: Ambulatory Visit | Attending: Emergency Medicine | Admitting: Emergency Medicine

## 2010-09-27 DIAGNOSIS — M79609 Pain in unspecified limb: Secondary | ICD-10-CM

## 2010-11-11 LAB — DIFFERENTIAL
Eosinophils Absolute: 0.4
Lymphs Abs: 5 — ABNORMAL HIGH
Neutro Abs: 9.1 — ABNORMAL HIGH
Neutrophils Relative %: 57

## 2010-11-11 LAB — CBC
MCV: 85.8
Platelets: 321
RBC: 5.73
WBC: 16 — ABNORMAL HIGH

## 2010-11-11 LAB — POCT I-STAT, CHEM 8
BUN: 7
Hemoglobin: 17.3 — ABNORMAL HIGH
Potassium: 3.3 — ABNORMAL LOW
Sodium: 140
TCO2: 27

## 2010-11-11 LAB — D-DIMER, QUANTITATIVE: D-Dimer, Quant: <0.22

## 2010-11-28 ENCOUNTER — Emergency Department (HOSPITAL_COMMUNITY)
Admission: EM | Admit: 2010-11-28 | Discharge: 2010-11-29 | Disposition: A | Discharge status: Home / Self Care | Payer: Medicaid Other | Attending: Emergency Medicine | Admitting: Emergency Medicine

## 2010-11-28 DIAGNOSIS — J45909 Unspecified asthma, uncomplicated: Secondary | ICD-10-CM | POA: Insufficient documentation

## 2010-11-28 DIAGNOSIS — R079 Chest pain, unspecified: Secondary | ICD-10-CM | POA: Insufficient documentation

## 2010-11-28 DIAGNOSIS — Z8679 Personal history of other diseases of the circulatory system: Secondary | ICD-10-CM | POA: Insufficient documentation

## 2010-11-28 DIAGNOSIS — E669 Obesity, unspecified: Secondary | ICD-10-CM | POA: Insufficient documentation

## 2010-11-28 DIAGNOSIS — I10 Essential (primary) hypertension: Secondary | ICD-10-CM | POA: Insufficient documentation

## 2010-11-29 ENCOUNTER — Emergency Department (HOSPITAL_COMMUNITY): Payer: Medicaid Other

## 2010-11-29 LAB — POCT I-STAT, CHEM 8
BUN: 11 mg/dL (ref 6–23)
Calcium, Ion: 1.19 mmol/L (ref 1.12–1.32)
Chloride: 103 meq/L (ref 96–112)
Creatinine, Ser: 1.4 mg/dL — ABNORMAL HIGH (ref 0.50–1.35)
Glucose, Bld: 115 mg/dL — ABNORMAL HIGH (ref 70–99)
HCT: 49 % (ref 39.0–52.0)
Hemoglobin: 16.7 g/dL (ref 13.0–17.0)
Potassium: 4 meq/L (ref 3.5–5.1)
Sodium: 141 meq/L (ref 135–145)
TCO2: 27 mmol/L (ref 0–100)

## 2010-11-29 LAB — POCT I-STAT TROPONIN I: Troponin i, poc: 0.02 ng/mL (ref 0.00–0.08)

## 2010-11-29 LAB — D-DIMER, QUANTITATIVE: D-Dimer, Quant: 0.37 ug{FEU}/mL (ref 0.00–0.48)

## 2010-12-13 ENCOUNTER — Inpatient Hospital Stay (INDEPENDENT_AMBULATORY_CARE_PROVIDER_SITE_OTHER)
Admission: RE | Admit: 2010-12-13 | Discharge: 2010-12-13 | Disposition: A | Discharge status: Home / Self Care | Payer: Medicaid Other | Source: Ambulatory Visit | Attending: Family Medicine | Admitting: Family Medicine

## 2010-12-13 DIAGNOSIS — R071 Chest pain on breathing: Secondary | ICD-10-CM

## 2011-05-01 ENCOUNTER — Encounter (HOSPITAL_COMMUNITY): Payer: Self-pay | Admitting: *Deleted

## 2011-05-01 ENCOUNTER — Other Ambulatory Visit: Payer: Self-pay

## 2011-05-01 ENCOUNTER — Other Ambulatory Visit (HOSPITAL_COMMUNITY): Payer: Self-pay | Admitting: Pharmacy Technician

## 2011-05-01 ENCOUNTER — Emergency Department (HOSPITAL_COMMUNITY)
Admission: EM | Admit: 2011-05-01 | Discharge: 2011-05-02 | Disposition: A | Discharge status: Home / Self Care | Payer: Self-pay | Attending: Emergency Medicine | Admitting: Emergency Medicine

## 2011-05-01 DIAGNOSIS — G43909 Migraine, unspecified, not intractable, without status migrainosus: Secondary | ICD-10-CM | POA: Insufficient documentation

## 2011-05-01 DIAGNOSIS — G510 Bell's palsy: Secondary | ICD-10-CM | POA: Insufficient documentation

## 2011-05-01 DIAGNOSIS — H53149 Visual discomfort, unspecified: Secondary | ICD-10-CM | POA: Insufficient documentation

## 2011-05-01 DIAGNOSIS — R112 Nausea with vomiting, unspecified: Secondary | ICD-10-CM | POA: Insufficient documentation

## 2011-05-01 DIAGNOSIS — R29810 Facial weakness: Secondary | ICD-10-CM | POA: Insufficient documentation

## 2011-05-01 DIAGNOSIS — R29818 Other symptoms and signs involving the nervous system: Secondary | ICD-10-CM | POA: Insufficient documentation

## 2011-05-01 DIAGNOSIS — E669 Obesity, unspecified: Secondary | ICD-10-CM | POA: Insufficient documentation

## 2011-05-01 DIAGNOSIS — K089 Disorder of teeth and supporting structures, unspecified: Secondary | ICD-10-CM | POA: Insufficient documentation

## 2011-05-01 DIAGNOSIS — E7800 Pure hypercholesterolemia, unspecified: Secondary | ICD-10-CM | POA: Insufficient documentation

## 2011-05-01 DIAGNOSIS — I10 Essential (primary) hypertension: Secondary | ICD-10-CM | POA: Insufficient documentation

## 2011-05-01 DIAGNOSIS — K0381 Cracked tooth: Secondary | ICD-10-CM | POA: Insufficient documentation

## 2011-05-01 DIAGNOSIS — Z79899 Other long term (current) drug therapy: Secondary | ICD-10-CM | POA: Insufficient documentation

## 2011-05-01 DIAGNOSIS — K029 Dental caries, unspecified: Secondary | ICD-10-CM | POA: Insufficient documentation

## 2011-05-01 HISTORY — DX: Essential (primary) hypertension: I10

## 2011-05-01 HISTORY — DX: Pure hypercholesterolemia, unspecified: E78.00

## 2011-05-01 MED ORDER — ACYCLOVIR 800 MG PO TABS: 800.0000 mg | ORAL_TABLET | Freq: Every day | ORAL | Status: AC

## 2011-05-01 MED ORDER — DROPERIDOL 2.5 MG/ML IJ SOLN
2.5000 mg | Freq: Once | INTRAMUSCULAR | Status: AC
  Administered 2011-05-01: 2.5 mg via INTRAVENOUS
  Filled 2011-05-01: qty 1

## 2011-05-01 MED ORDER — METOCLOPRAMIDE HCL 5 MG/ML IJ SOLN
10.0000 mg | Freq: Once | INTRAMUSCULAR | Status: AC
  Administered 2011-05-01: 10 mg via INTRAVENOUS
  Filled 2011-05-01: qty 2

## 2011-05-01 MED ORDER — SODIUM CHLORIDE 0.9 % IV BOLUS (SEPSIS)
500.0000 mL | INTRAVENOUS | Status: AC
  Administered 2011-05-01: 500 mL via INTRAVENOUS

## 2011-05-01 MED ORDER — HYDROCHLOROTHIAZIDE 25 MG PO TABS: 25.0000 mg | ORAL_TABLET | Freq: Every day | ORAL | Status: DC

## 2011-05-01 MED ORDER — DIPHENHYDRAMINE HCL 50 MG/ML IJ SOLN
50.0000 mg | Freq: Once | INTRAMUSCULAR | Status: AC
  Administered 2011-05-01: 50 mg via INTRAVENOUS
  Filled 2011-05-01: qty 1

## 2011-05-01 MED ORDER — KETOROLAC TROMETHAMINE 30 MG/ML IJ SOLN
30.0000 mg | Freq: Once | INTRAMUSCULAR | Status: AC
  Administered 2011-05-01: 30 mg via INTRAVENOUS
  Filled 2011-05-01: qty 1

## 2011-05-01 MED ORDER — ACETAMINOPHEN 325 MG PO TABS
ORAL_TABLET | ORAL | Status: AC
  Administered 2011-05-01: 650 mg
  Filled 2011-05-01: qty 2

## 2011-05-01 MED ORDER — SODIUM CHLORIDE 0.9 % IV SOLN
INTRAVENOUS | Status: DC
  Administered 2011-05-01: 100 mL/h via INTRAVENOUS

## 2011-05-01 MED ORDER — PREDNISONE 10 MG PO TABS: ORAL_TABLET | ORAL | Status: DC

## 2011-05-01 MED ORDER — CLONIDINE HCL 0.1 MG PO TABS
0.1000 mg | ORAL_TABLET | Freq: Once | ORAL | Status: AC
Start: 2011-05-01 — End: 2011-05-01
  Administered 2011-05-01: 0.1 mg via ORAL
  Filled 2011-05-01 (×2): qty 1

## 2011-05-01 MED ORDER — MAGNESIUM SULFATE 40 MG/ML IJ SOLN
2.0000 g | INTRAMUSCULAR | Status: AC
  Administered 2011-05-01: 2 g via INTRAVENOUS
  Filled 2011-05-01: qty 50

## 2011-05-01 NOTE — ED Notes (Signed)
Pt sts he noticed left sided facial paralysis since yesterday morning. Woke up with symptoms. Sts L side of face feels numb. Sts able to feel touch. Has unilateral smile. Does not feel as though he can squint L eye well. Reports tingling in L arm, otherwise no weakness on L side, or other deficits. Denies asphasia. HTN, last dose of meds today. C/o severe HA.

## 2011-05-01 NOTE — Discharge Instructions (Signed)
Take the medications as prescribed. Call Dr Nash Dimmer office to be rechecked for the facial weakness that you have from the Bell's Palsy. He is a neurologist. You will need to use artifical tears which are OTC to keep your cornea (clear covering of your eye) from drying out. You will also need to patch your eye at night and tape it shut after you put in lacrilube which is also OTC in your eye at night. You will need to keep your eye moist or you could have permanent injury to your cornea.  Restart your blood pressure medications. Recheck as needed.    Bell's Palsy Bell's palsy is a condition in which one side of the face becomes temporarily weak or paralyzed. Most of the time no cause is found. A viral infection of the facial nerve is the most commonly suspected cause. The condition almost always clears up in a few weeks to months. However, in a small number of people, the weakness can be permanent. Sometimes steroid medicines and antiviral medicines are prescribed to improve recovery time. Blood and other tests are usually not needed, but they may be performed at your caregiver's discretion, to rule out other causes. Careful follow up is importantto be sure the facial nerve is recovering. Because facial weakness can make it hard to blink, it is important to prevent drying of the eye. Artificial tears are often prescribed to keep the eye lubricated. Glasses or an eye patch should be worn to protect the eye, if you cannot close your eye completely. If the eye is not protected, permanent damage can be done to the cornea (clear covering over your eye). Sometimes facial massage and exercises help weakened muscles recover.  SEEK IMMEDIATE MEDICAL CARE IF:   You have increased weakness, earache, headache, or dizziness.   You develop new problems or symptoms, or the area of weakness or paralysis extends beyond the face.   You feel you are getting worse.  Document Released: 03/09/2004 Document Revised: 01/19/2011  Document Reviewed: 02/08/2009 [facility] Patient Information 2012 [place], [place].

## 2011-05-01 NOTE — ED Notes (Signed)
JXB:[id number]<GN> Expected date:05/01/11<BR> Expected time: 3:01 PM<BR> Means of arrival:Ambulance<BR> Comments:<BR> EMS M33 GC -- General Weakness, N/V

## 2011-05-01 NOTE — ED Provider Notes (Signed)
History     CSN: [id number]  Arrival date & time 05/01/11  1510   First MD Initiated Contact with Patient 05/01/11 1557      Chief Complaint  Patient presents with  . Facial Droop    (Consider location/radiation/quality/duration/timing/severity/associated sxs/prior treatment) HPI  Patient relates yesterday he woke up with the left side of his face being paralyzed. He states he was totally fine the day before. He states today he has a whole cranial headache that is pounding. He relates he's had migraine headaches before but this feels worse than usual. He has photophobia, nausea, vomiting, but denies blurred vision. He denies fever but states he also broke a tooth and has pain in that molar on the right lower side. He denies any numbness, tingling, or weakness in his arms or legs. He relates his left eye is red and he cannot close his eye lid.  Patient states he took his last dose of hydrochlorothiazide yesterday.  Patient relates he had something similar in June 2012 and was told he had a stroke.  PCP Dr. Merla Riches at [facility] urgent care  Past Medical History  Diagnosis Date  . Hypertension   . Hypercholesteremia     Past Surgical History  Procedure Date  . Orthopedic surgery     No family history on file.  History  Substance Use Topics  . Smoking status: Current Some Day Smoker    Types: Cigars  . Smokeless tobacco: Not on file  . Alcohol Use: Yes     socially   employed as a Warehouse manager    Review of Systems  All other systems reviewed and are negative.    Allergies  Tea  Home Medications   Current Outpatient Rx  Name Route Sig Dispense Refill  . ACETAMINOPHEN 325 MG PO TABS Oral Take 650 mg by mouth every 6 (six) hours as needed. For pain relief    . HYDROCHLOROTHIAZIDE 25 MG PO TABS Oral Take 25 mg by mouth daily.    Marland Kitchen HYDROCODONE-ACETAMINOPHEN 5-325 MG PO TABS Oral Take 1 tablet by mouth every 6 (six) hours as needed. For knee pain     . LOVASTATIN 20 MG PO TABS Oral Take 20 mg by mouth at bedtime.    . ADULT MULTIVITAMIN W/MINERALS CH Oral Take 1 tablet by mouth daily.    Marland Kitchen NAPROXEN SODIUM 220 MG PO TABS Oral Take 220 mg by mouth 2 (two) times daily with a meal.    . VITAMIN C 500 MG PO TABS Oral Take 500 mg by mouth daily.      BP 167/80  Pulse 82  Temp(Src) 98.4 F (36.9 C) (Oral)  Resp 19  SpO2 100%  Vital signs normal except mild hypertension   Physical Exam  Nursing note and vitals reviewed. Constitutional: He is oriented to person, place, and time. He appears well-developed and well-nourished.  Non-toxic appearance. He does not appear ill. No distress.       Obese  HENT:  Head: Normocephalic and atraumatic.  Right Ear: External ear normal.  Left Ear: External ear normal.  Nose: Nose normal. No mucosal edema or rhinorrhea.  Mouth/Throat: Oropharynx is clear and moist and mucous membranes are normal. No dental abscesses or uvula swelling.       Patient's right lower molar appears to have a cavity on the posterior aspect. His gums are normal surrounding that tooth.  Eyes: Conjunctivae and EOM are normal. Pupils are equal, round, and reactive to light.  Neck:  Normal range of motion and full passive range of motion without pain. Neck supple.  Cardiovascular: Normal rate, regular rhythm and normal heart sounds.  Exam reveals no gallop and no friction rub.   No murmur heard. Pulmonary/Chest: Effort normal and breath sounds normal. No respiratory distress. He has no wheezes. He has no rhonchi. He has no rales. He exhibits no tenderness and no crepitus.  Abdominal: Soft. Normal appearance and bowel sounds are normal. He exhibits no distension. There is no tenderness. There is no rebound and no guarding.  Musculoskeletal: Normal range of motion. He exhibits no edema and no tenderness.       Moves all extremities well.   Neurological: He is alert and oriented to person, place, and time. He has normal strength. A  cranial nerve deficit is present.       Patient has equal grips. He has normal motor strength in his arms or legs. Patient has a peripheral facial nerve palsy on the left. He has difficulty closing his left eye. He has loss of range of motion of his left eyebrow.  Skin: Skin is warm, dry and intact. No rash noted. No erythema. No pallor.  Psychiatric: He has a normal mood and affect. His speech is normal and behavior is normal. His mood appears not anxious.    ED Course  Procedures (including critical care time)  Review of records shows in June 2012 admitted with hypertensive crisis and had left sided facial numbness but no weakness. Had 2 normal CT head, unable to have MRI b/o weight limit. Pt states he wasn't put on any specific meds such as plavix.   Medications  0.9 %  sodium chloride infusion (100 mL/hr Intravenous New Bag/Given 05/01/11 2016)  acetaminophen (TYLENOL) 325 MG tablet (650 mg  Given 05/01/11 1812)  sodium chloride 0.9 % bolus 500 mL (500 mL Intravenous Given 05/01/11 1752)  metoCLOPramide (REGLAN) injection 10 mg (10 mg Intravenous Given 05/01/11 1752)  diphenhydrAMINE (BENADRYL) injection 50 mg (50 mg Intravenous Given 05/01/11 1753)  ketorolac (TORADOL) 30 MG/ML injection 30 mg (30 mg Intravenous Given 05/01/11 1949)  magnesium sulfate IVPB 2 g 50 mL (2 g Intravenous Given 05/01/11 2016)  cloNIDine (CATAPRES) tablet 0.1 mg (0.1 mg Oral Given 05/01/11 2051)  droperidol (INAPSINE) injection 2.5 mg (2.5 mg Intravenous Given 05/01/11 2319)   Pt ED visit prolonged b/o his headache (has hx of migraines).     Date: 05/01/2011  Rate: 82  Rhythm: normal sinus rhythm  QRS Axis: normal  Intervals: normal  ST/T Wave abnormalities: nonspecific T wave changes  Conduction Disutrbances:none  Narrative Interpretation:   Old EKG Reviewed: unchanged from 11/29/2010     1. Bell's palsy   2. Hypertension   3. Migraine headache     Plan discharge Devoria Albe, MD, FACEP   MDM           Ward Givens, MD 05/01/11 [phone]

## 2011-06-01 ENCOUNTER — Emergency Department (INDEPENDENT_AMBULATORY_CARE_PROVIDER_SITE_OTHER)
Admission: EM | Admit: 2011-06-01 | Discharge: 2011-06-01 | Disposition: A | Discharge status: Home / Self Care | Payer: Self-pay | Source: Home / Self Care | Attending: Family Medicine | Admitting: Family Medicine

## 2011-06-01 ENCOUNTER — Encounter (HOSPITAL_COMMUNITY): Payer: Self-pay | Admitting: Emergency Medicine

## 2011-06-01 DIAGNOSIS — I10 Essential (primary) hypertension: Secondary | ICD-10-CM

## 2011-06-01 DIAGNOSIS — K0889 Other specified disorders of teeth and supporting structures: Secondary | ICD-10-CM

## 2011-06-01 DIAGNOSIS — R51 Headache: Secondary | ICD-10-CM

## 2011-06-01 DIAGNOSIS — K089 Disorder of teeth and supporting structures, unspecified: Secondary | ICD-10-CM

## 2011-06-01 MED ORDER — AMOXICILLIN 500 MG PO CAPS: 500.0000 mg | ORAL_CAPSULE | Freq: Three times a day (TID) | ORAL | Status: AC

## 2011-06-01 MED ORDER — KETOROLAC TROMETHAMINE 60 MG/2ML IM SOLN
60.0000 mg | Freq: Once | INTRAMUSCULAR | Status: AC
  Administered 2011-06-01: 60 mg via INTRAMUSCULAR

## 2011-06-01 MED ORDER — KETOROLAC TROMETHAMINE 60 MG/2ML IM SOLN
INTRAMUSCULAR | Status: AC
  Filled 2011-06-01: qty 2

## 2011-06-01 MED ORDER — DIPHENHYDRAMINE HCL 50 MG/ML IJ SOLN
12.5000 mg | Freq: Once | INTRAMUSCULAR | Status: AC
  Administered 2011-06-01: 12.5 mg via INTRAMUSCULAR

## 2011-06-01 MED ORDER — ONDANSETRON 4 MG PO TBDP
8.0000 mg | ORAL_TABLET | Freq: Once | ORAL | Status: AC
  Administered 2011-06-01: 8 mg via ORAL

## 2011-06-01 MED ORDER — HYDROCODONE-ACETAMINOPHEN 5-325 MG PO TABS: ORAL_TABLET | ORAL | Status: AC

## 2011-06-01 MED ORDER — ONDANSETRON 4 MG PO TBDP
ORAL_TABLET | ORAL | Status: AC
  Filled 2011-06-01: qty 2

## 2011-06-01 MED ORDER — DIPHENHYDRAMINE HCL 50 MG/ML IJ SOLN
INTRAMUSCULAR | Status: AC
  Filled 2011-06-01: qty 1

## 2011-06-01 MED ORDER — HYDROCHLOROTHIAZIDE 25 MG PO TABS: 25.0000 mg | ORAL_TABLET | Freq: Every day | ORAL | Status: DC

## 2011-06-01 NOTE — Discharge Instructions (Signed)
Take medications as directed. Follow up with dental provider as directed. Do not take antibiotics unless directed to do so by dental provider. Return to care should your symptoms not improve, or worsen in any way.

## 2011-06-01 NOTE — ED Notes (Signed)
PT HERE WITH RIGHT SIDE MOUTH TOOTHACHE WITH RADIATING HEADACHE WITH VOMITING THAT STARTED X 2 DYS AGO.DENIES BLURRY VISION BUT SOME SENSITIVITY TO LIGHT.PT HASBEEN HAVING TOOTHACHE FOR 3 MNTHS.TAKING BC POWDER.PT ALSO RAN OUT OF [facility] HCTZ YESTERDAY AND DOCTORS APPT NOT UNTIL NEXT MONTH

## 2011-06-01 NOTE — ED Notes (Signed)
AFFORDABLE DENTURES INFORMATION GIVEN FOR F/U

## 2011-06-01 NOTE — ED Provider Notes (Signed)
History     CSN: [id number]  Arrival date & time 06/01/11  1239   First MD Initiated Contact with Patient 06/01/11 1336      Chief Complaint  Patient presents with  . Dental Pain  . Headache    (Consider location/radiation/quality/duration/timing/severity/associated sxs/prior treatment) HPI Comments: [name] [name] presents for evaluation of a headache, he has hx of headaches, non-migrainous. He works as a Naval architect. He also now presents with RIGHT lower posterior molar pain; this tooth was previously repaired with sealant. He denies any recent injury, and has been using OTC anesthetics without relief; given toradol 60 mg and ondansetron 8 mg ODT, and diphenhydramine 12.5 mg IM in clinic with relief. Patient is a 31 y.o. male presenting with headaches and tooth pain. The history is provided by the patient.  Headache The primary symptoms include headaches. Primary symptoms do not include syncope, dizziness, visual change, paresthesias, focal weakness, nausea or vomiting. The symptoms began 2 days ago. The symptoms are unchanged. The neurological symptoms are diffuse.  The headache began 2 days ago. Headache is a recurrent problem. Location/region(s) of the headache: bilateral. The headache is associated with nothing. The headache is not associated with aura, photophobia, eye pain, visual change, neck stiffness, paresthesias or weakness.  Additional symptoms do not include neck stiffness, weakness, photophobia or aura.  Dental PainThe primary symptoms include mouth pain and headaches. Primary symptoms do not include dental injury, oral bleeding, oral lesions or sore throat. The symptoms began more than 1 week ago. The symptoms are unchanged.  The headache is not associated with aura, photophobia, eye pain, visual change, neck stiffness, paresthesias or weakness.  Additional symptoms include: gum swelling and gum tenderness. Additional symptoms do not include: jaw pain and trouble swallowing.     Past Medical History  Diagnosis Date  . Hypertension   . Hypercholesteremia     Past Surgical History  Procedure Date  . Orthopedic surgery     No family history on file.  History  Substance Use Topics  . Smoking status: Current Some Day Smoker    Types: Cigars  . Smokeless tobacco: Not on file  . Alcohol Use: Yes     socially      Review of Systems  Constitutional: Negative.   HENT: Positive for dental problem. Negative for sore throat, trouble swallowing, neck stiffness and voice change.   Eyes: Negative.  Negative for photophobia and pain.  Respiratory: Negative.   Cardiovascular: Negative.  Negative for syncope.  Gastrointestinal: Negative.  Negative for nausea and vomiting.  Genitourinary: Negative.   Musculoskeletal: Negative.   Skin: Negative.   Neurological: Positive for headaches. Negative for dizziness, focal weakness, syncope, weakness, numbness and paresthesias.    Allergies  Tea  Home Medications   Current Outpatient Rx  Name Route Sig Dispense Refill  . ACETAMINOPHEN 325 MG PO TABS Oral Take 650 mg by mouth every 6 (six) hours as needed. For pain relief    . AMOXICILLIN 500 MG PO CAPS Oral Take 1 capsule (500 mg total) by mouth 3 (three) times daily. 30 capsule 0  . HYDROCHLOROTHIAZIDE 25 MG PO TABS Oral Take 1 tablet (25 mg total) by mouth daily. 30 tablet 0  . HYDROCHLOROTHIAZIDE 25 MG PO TABS Oral Take 1 tablet (25 mg total) by mouth daily. 30 tablet 1  . HYDROCODONE-ACETAMINOPHEN 5-325 MG PO TABS Oral Take 1 tablet by mouth every 6 (six) hours as needed. For knee pain    . HYDROCODONE-ACETAMINOPHEN 5-325 MG PO TABS  Take one to two tablets every 4 to 6 hours as needed for pain 20 tablet 0  . LOVASTATIN 20 MG PO TABS Oral Take 20 mg by mouth at bedtime.    . ADULT MULTIVITAMIN W/MINERALS CH Oral Take 1 tablet by mouth daily.    Marland Kitchen NAPROXEN SODIUM 220 MG PO TABS Oral Take 220 mg by mouth 2 (two) times daily with a meal.    . PREDNISONE 10 MG  PO TABS  Take 3 po QD x 4d , then 2 po QD x 4d then 1 po QD x 4d 24 tablet 0  . VITAMIN C 500 MG PO TABS Oral Take 500 mg by mouth daily.      BP 145/87  Pulse 91  Temp(Src) 98.9 F (37.2 C) (Oral)  Resp 16  SpO2 100%  Physical Exam  Nursing note and vitals reviewed. Constitutional: He is oriented to person, place, and time. He appears well-developed and well-nourished.  HENT:  Head: Normocephalic and atraumatic.  Right Ear: Tympanic membrane normal.  Left Ear: Tympanic membrane normal.  Mouth/Throat: Uvula is midline, oropharynx is clear and moist and mucous membranes are normal. Normal dentition. No dental abscesses, uvula swelling or dental caries.    Eyes: EOM are normal. Pupils are equal, round, and reactive to light.  Neck: Normal range of motion.  Cardiovascular: Normal rate and regular rhythm.   Pulmonary/Chest: Effort normal and breath sounds normal. He has no decreased breath sounds. He has no wheezes. He has no rhonchi.  Musculoskeletal: Normal range of motion.  Neurological: He is alert and oriented to person, place, and time. Coordination normal.  Skin: Skin is warm and dry.  Psychiatric: His behavior is normal.    ED Course  Procedures (including critical care time)  Labs Reviewed - No data to display No results found.   1. Headache   2. Toothache   3. Hypertension       MDM  Given ketorolac 60 mg IM x 1, ondansetron 8 mg ODT x 1, and diphenhydramine 12.5 mg IM x1, with relief of headache; refilled HCTZ 25 mg PO daily for HTN; referred to [facility] for dental evaluation        Renaee Munda, MD 06/02/11 1000

## 2012-04-02 ENCOUNTER — Encounter (HOSPITAL_COMMUNITY): Payer: Self-pay | Admitting: Emergency Medicine

## 2012-04-02 ENCOUNTER — Emergency Department (INDEPENDENT_AMBULATORY_CARE_PROVIDER_SITE_OTHER)
Admission: EM | Admit: 2012-04-02 | Discharge: 2012-04-02 | Disposition: A | Discharge status: Home / Self Care | Payer: Self-pay | Source: Home / Self Care | Attending: Emergency Medicine | Admitting: Emergency Medicine

## 2012-04-02 DIAGNOSIS — M25569 Pain in unspecified knee: Secondary | ICD-10-CM

## 2012-04-02 MED ORDER — KETOROLAC TROMETHAMINE 60 MG/2ML IM SOLN
INTRAMUSCULAR | Status: AC
  Filled 2012-04-02: qty 2

## 2012-04-02 MED ORDER — LOVASTATIN 40 MG PO TABS: 40.0000 mg | ORAL_TABLET | Freq: Every day | ORAL | Status: DC

## 2012-04-02 MED ORDER — OXYCODONE-ACETAMINOPHEN 5-325 MG PO TABS: ORAL_TABLET | ORAL | Status: DC

## 2012-04-02 MED ORDER — HYDROCHLOROTHIAZIDE 25 MG PO TABS: 25.0000 mg | ORAL_TABLET | Freq: Every day | ORAL | Status: DC

## 2012-04-02 MED ORDER — DICLOFENAC SODIUM 75 MG PO TBEC: 75.0000 mg | DELAYED_RELEASE_TABLET | Freq: Two times a day (BID) | ORAL | Status: DC

## 2012-04-02 MED ORDER — KETOROLAC TROMETHAMINE 60 MG/2ML IM SOLN
60.0000 mg | Freq: Once | INTRAMUSCULAR | Status: AC
  Administered 2012-04-02: 60 mg via INTRAMUSCULAR

## 2012-04-02 NOTE — ED Notes (Signed)
Fell on L knee on 2/10.  States he slid on wet floor at work. Was off the clock and is not filing workmans compensation.  C/o pain and swelling for 3 days.  Used knee immobilizer and iced it with some relief.  Had previous injury L knee playing football.  Shattered tib/fib, tore ACL and MCL.  Has had 14 surgeries on it all at the same time in '04.  Has 2 rods and 18 screws.

## 2012-04-02 NOTE — ED Provider Notes (Signed)
Chief Complaint  Patient presents with  . Knee Pain    History of Present Illness:   [name] [name] is a 32 year old male who has had a ten-day history of increased pain in his left knee. 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 He's had pain and swelling over the past 3 days. He has had chronic knee pain following a football injury which involved a fracture of his tibia and fibula, and tears of his anterior cruciate ligament and MCL. He had a complete this reconstruction of his knee at [facility] in 2004. Ever since then he's had pain off and on. He's tried anti-inflammatories and pain meds. He states he has trouble taking pain meds and that they tend to make him hyper and keep her awake at night. He is able ambulate. There is no locking, popping, or giving way the joint. Dr. Rennis Chris his orthopedist.  Review of Systems:  Other than noted above, the patient denies any of the following symptoms: Systemic:  No fevers, chills, sweats, or aches.  No fatigue or tiredness. Musculoskeletal:  No joint pain, arthritis, bursitis, swelling, back pain, or neck pain.  Neurological:  No muscular weakness, paresthesias, headache, or trouble with speech or coordination.  No dizziness.   PMFSH:  Past medical history, family history, social history, meds, and allergies were reviewed.  No prior history of knee pain or arthritis.  Physical Exam:   Vital signs:  BP 143/81  Pulse 96  Temp(Src) 98.5 F (36.9 C) (Oral)  Resp 20  SpO2 96% Gen:  Alert and oriented times 3.  In no distress. Musculoskeletal: He has pain to palpation over the patellar tendon, the medial joint line, and the popliteal fossa. The exam was difficult because he has a very large leg.   McMurray's test was negative.  Lachman's test was negative.  Anterior drawer test was negative.   Varus and valgus stress negative.  Otherwise, all joints had a full a ROM with no swelling, bruising or  deformity.  No edema, pulses full. Extremities were warm and pink.  Capillary refill was brisk.  Skin:  Clear, warm and dry.  No rash. Neuro:  Alert and oriented times 3.  Muscle strength was normal.  Sensation was intact to light touch.   Course in Urgent Care Center:   He was given a knee sleeve and is to use an Ace wrap over the top of that. I also recommended that he use a cane for ambulation.  Assessment:  The encounter diagnosis was Knee pain.  No evidence for fracture. I think this is a knee sprain. I recommended a followup with Dr. Rennis Chris.  Plan:   1.  The following meds were prescribed:   Discharge Medication List as of 04/02/2012  5:40 PM    START taking these medications   Details  diclofenac (VOLTAREN) 75 MG EC tablet Take 1 tablet (75 mg total) by mouth 2 (two) times daily., Starting 04/02/2012, Until Discontinued, Normal    oxyCODONE-acetaminophen (PERCOCET) 5-325 MG per tablet 1 to 2 tablets every 6 hours as needed for pain., Print       2.  The patient was instructed in symptomatic care, including rest and activity, elevation, application of ice and compression.  Appropriate handouts were given. 3.  The patient was told to return if becoming worse in any way, if no better in 3 or 4 days, and given some red flag symptoms that  would indicate earlier return.   4.  The patient was told to follow up with Dr. Rennis Chris in the next week.    Reuben Likes, MD 04/02/12 2122

## 2012-12-11 ENCOUNTER — Encounter (HOSPITAL_COMMUNITY): Payer: Self-pay | Admitting: Emergency Medicine

## 2012-12-11 ENCOUNTER — Emergency Department (INDEPENDENT_AMBULATORY_CARE_PROVIDER_SITE_OTHER): Payer: Self-pay

## 2012-12-11 ENCOUNTER — Emergency Department (INDEPENDENT_AMBULATORY_CARE_PROVIDER_SITE_OTHER)
Admission: EM | Admit: 2012-12-11 | Discharge: 2012-12-11 | Disposition: A | Discharge status: Home / Self Care | Payer: Self-pay | Source: Home / Self Care | Attending: Emergency Medicine | Admitting: Emergency Medicine

## 2012-12-11 DIAGNOSIS — S43429A Sprain of unspecified rotator cuff capsule, initial encounter: Secondary | ICD-10-CM

## 2012-12-11 DIAGNOSIS — S46011A Strain of muscle(s) and tendon(s) of the rotator cuff of right shoulder, initial encounter: Secondary | ICD-10-CM

## 2012-12-11 MED ORDER — KETOROLAC TROMETHAMINE 60 MG/2ML IM SOLN
60.0000 mg | Freq: Once | INTRAMUSCULAR | Status: AC
  Administered 2012-12-11: 60 mg via INTRAMUSCULAR

## 2012-12-11 MED ORDER — HYDROMORPHONE HCL PF 1 MG/ML IJ SOLN
2.0000 mg | Freq: Once | INTRAMUSCULAR | Status: AC
  Administered 2012-12-11: 2 mg via INTRAMUSCULAR

## 2012-12-11 MED ORDER — ONDANSETRON HCL 4 MG/2ML IJ SOLN
INTRAMUSCULAR | Status: AC
  Filled 2012-12-11: qty 2

## 2012-12-11 MED ORDER — HYDROCODONE-ACETAMINOPHEN 5-325 MG PO TABS: 2.0000 | ORAL_TABLET | Freq: Once | ORAL | Status: DC

## 2012-12-11 MED ORDER — HYDROMORPHONE HCL PF 1 MG/ML IJ SOLN
INTRAMUSCULAR | Status: AC
  Filled 2012-12-11: qty 2

## 2012-12-11 MED ORDER — HYDROCHLOROTHIAZIDE 25 MG PO TABS: 25.0000 mg | ORAL_TABLET | Freq: Every day | ORAL | Status: DC

## 2012-12-11 MED ORDER — ONDANSETRON HCL 4 MG/2ML IJ SOLN
4.0000 mg | Freq: Once | INTRAMUSCULAR | Status: AC
  Administered 2012-12-11: 4 mg via INTRAMUSCULAR

## 2012-12-11 MED ORDER — KETOROLAC TROMETHAMINE 60 MG/2ML IM SOLN
INTRAMUSCULAR | Status: AC
  Filled 2012-12-11: qty 2

## 2012-12-11 MED ORDER — OXYCODONE-ACETAMINOPHEN 5-325 MG PO TABS: ORAL_TABLET | ORAL | Status: DC

## 2012-12-11 NOTE — ED Notes (Signed)
Reportedly fell yesterday into a hole yesterday; did not see the hole . C/o pain in right shoulder, denies other injury

## 2012-12-11 NOTE — ED Notes (Signed)
Mother here w pt and will drive

## 2012-12-11 NOTE — ED Provider Notes (Signed)
Chief Complaint:   Chief Complaint  Patient presents with  . Fall    History of Present Illness:   [name] [name] is a 32 year old male who fell yesterday at work. 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 He stepped in a hole and fell forward. He caught his weight on his right arm. Ever since then he's had pain in the shoulder and is unable to flex, abduct, or internal or external rotate. There is no numbness or tingling in the arm. Pain does not radiate down the arm. He did not hit his head and there was no loss of consciousness. No other obvious injuries.  Review of Systems:  Other than noted above, the patient denies any of the following symptoms: Systemic:  No fevers, chills, sweats, or aches.  No fatigue or tiredness. Musculoskeletal:  No joint pain, arthritis, bursitis, swelling, back pain, or neck pain. Neurological:  No muscular weakness, paresthesias, headache, or trouble with speech or coordination.  No dizziness.  PMFSH:  Past medical history, family history, social history, meds, and allergies were reviewed.  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 He takes hydrochlorothiazide and lovastatin. He has high blood pressure.  Physical Exam:   Vital signs:  BP 168/123  Pulse 87  Temp(Src) 98.5 F (36.9 C) (Oral)  Resp 19  SpO2 95% Gen:  Alert and oriented times 3.  In no distress. Musculoskeletal: He has pain to palpation anteriorly. No swelling, bruising, or deformity. His shoulder has almost 0 range of motion with pain in all directions. Unable to perform Neer test, Hawkins test, or empty cans test. Otherwise, all joints had a full a ROM with no swelling, bruising or deformity.  No edema, pulses full. Extremities were warm and pink.  Capillary refill was brisk.  Skin:  Clear, warm and dry.  No rash. Neuro:  Alert and oriented times 3.  Muscle strength was normal.  Sensation was intact to light touch.   Radiology:  Dg Shoulder Right  12/11/2012   CLINICAL DATA:   Fall, shoulder injury  EXAM: RIGHT SHOULDER - 2+ VIEW  COMPARISON:  None.  FINDINGS: There is no evidence of fracture or dislocation. There is no evidence of arthropathy or other focal bone abnormality. Soft tissues are unremarkable.  IMPRESSION: No acute osseous findings.   Electronically Signed   By: Ruel Favors M.D.   On: 12/11/2012 17:10   I reviewed the images independently and personally and concur with the radiologist's findings.  Course in Urgent Care Center:   He was placed in a shoulder immobilizer. For pain he was given Dilaudid 2 mg IM and Zofran 4 mg IM. His pain was still unrelieved, so he was given Toradol 60 mg IM.  Assessment:  The encounter diagnosis was Rotator cuff strain, right, initial encounter.  Probable partial rotator cuff tear. I doubt that he got a complete rotator cuff tear. He will need followup with orthopedics.  Plan:   1.  Meds:  The following meds were prescribed:   Discharge Medication List as of 12/11/2012  5:31 PM    START taking these medications   Details  !! oxyCODONE-acetaminophen (PERCOCET) 5-325 MG per tablet 1 to 2 tablets every 6 hours as needed for pain., Print     !! - Potential duplicate medications found. Please discuss with provider.      2.  Patient Education/Counseling:  The patient was given appropriate handouts, self care instructions, and instructed in symptomatic relief.  She rest and  apply ice.  3.  Follow up:  The patient was told to follow up if no better in 3 to 4 days, if becoming worse in any way, and given some red flag symptoms such as worsening pain which would prompt immediate return.  Follow up with Dr. Patsy Lager as soon as possible.      Reuben Likes, MD 12/11/12 [phone]

## 2013-06-23 ENCOUNTER — Encounter (HOSPITAL_COMMUNITY): Payer: Self-pay | Admitting: Emergency Medicine

## 2013-06-23 ENCOUNTER — Emergency Department (HOSPITAL_COMMUNITY)
Admission: EM | Admit: 2013-06-23 | Discharge: 2013-06-23 | Disposition: A | Discharge status: Home / Self Care | Payer: Self-pay | Source: Home / Self Care | Attending: Family Medicine | Admitting: Family Medicine

## 2013-06-23 DIAGNOSIS — S83241A Other tear of medial meniscus, current injury, right knee, initial encounter: Secondary | ICD-10-CM

## 2013-06-23 DIAGNOSIS — J309 Allergic rhinitis, unspecified: Secondary | ICD-10-CM

## 2013-06-23 DIAGNOSIS — IMO0002 Reserved for concepts with insufficient information to code with codable children: Secondary | ICD-10-CM

## 2013-06-23 DIAGNOSIS — I10 Essential (primary) hypertension: Secondary | ICD-10-CM

## 2013-06-23 DIAGNOSIS — J302 Other seasonal allergic rhinitis: Secondary | ICD-10-CM

## 2013-06-23 MED ORDER — FLUTICASONE PROPIONATE 50 MCG/ACT NA SUSP: 1.0000 | Freq: Two times a day (BID) | NASAL | Status: DC

## 2013-06-23 MED ORDER — HYDROCHLOROTHIAZIDE 25 MG PO TABS: 25.0000 mg | ORAL_TABLET | Freq: Every day | ORAL | Status: DC

## 2013-06-23 MED ORDER — CETIRIZINE HCL 10 MG PO TABS: 10.0000 mg | ORAL_TABLET | Freq: Every day | ORAL | Status: DC

## 2013-06-23 MED ORDER — DICLOFENAC POTASSIUM 50 MG PO TABS: 50.0000 mg | ORAL_TABLET | Freq: Three times a day (TID) | ORAL | Status: DC

## 2013-06-23 NOTE — ED Notes (Signed)
Reports knee pain is chronic, no new injury.  Patient reports left chest pain.  Sharp pain under breast when he takes a deep breath.

## 2013-06-23 NOTE — Discharge Instructions (Signed)
See prthopedist for further knee eval.

## 2013-06-23 NOTE — ED Provider Notes (Signed)
CSN: [id number]     Arrival date & time 06/23/13  1602 History   First MD Initiated Contact with Patient 06/23/13 1708     Chief Complaint  Patient presents with  . Knee Pain  . Chest Pain  . Cough   (Consider location/radiation/quality/duration/timing/severity/associated sxs/prior Treatment) Patient is a 33 y.o. male presenting with knee pain and cough. The history is provided by the patient.  Knee Pain Location:  Knee Time since incident:  2 days (got off commode and felt pop in knee. had pain and swelling since.) Injury: no   Knee location:  R knee Chronicity:  Chronic Dislocation: no   Prior injury to area:  Yes (played football.) Associated symptoms: decreased ROM   Risk factors: obesity   Cough Cough characteristics:  Non-productive and dry Severity:  Moderate Onset quality:  Gradual Duration:  1 week Progression:  Worsening Chronicity:  New Smoker: no   Context: exposure to allergens and weather changes   Associated symptoms: rhinorrhea     Past Medical History  Diagnosis Date  . Hypertension   . Hypercholesteremia    Past Surgical History  Procedure Laterality Date  . Orthopedic surgery     No family history on file. History  Substance Use Topics  . Smoking status: Current Some Day Smoker    Types: Cigars  . Smokeless tobacco: Not on file  . Alcohol Use: Yes     Comment: socially    Review of Systems  Constitutional: Negative.   HENT: Positive for congestion, postnasal drip, rhinorrhea and sneezing.   Respiratory: Positive for cough.   Musculoskeletal: Positive for gait problem and joint swelling.    Allergies  Tea  Home Medications   Prior to Admission medications   Medication Sig Start Date End Date Taking? Authorizing Provider  acetaminophen (TYLENOL) 325 MG tablet Take 650 mg by mouth every 6 (six) hours as needed. For pain relief    Historical Provider, MD  diclofenac (VOLTAREN) 75 MG EC tablet Take 1 tablet (75 mg total) by mouth 2  (two) times daily. 04/02/12   Reuben Likesavid C Keller, MD  hydrochlorothiazide (HYDRODIURIL) 25 MG tablet Take 1 tablet (25 mg total) by mouth daily. 05/01/11 04/30/12  Ward GivensIva L Knapp, MD  hydrochlorothiazide (HYDRODIURIL) 25 MG tablet Take 1 tablet (25 mg total) by mouth daily. 06/01/11   Delanna Noticeonald Laney, MD  hydrochlorothiazide (HYDRODIURIL) 25 MG tablet Take 1 tablet (25 mg total) by mouth daily. 04/02/12   Reuben Likesavid C Keller, MD  hydrochlorothiazide (HYDRODIURIL) 25 MG tablet Take 1 tablet (25 mg total) by mouth daily. 12/11/12   Reuben Likesavid C Keller, MD  HYDROcodone-acetaminophen (NORCO) 5-325 MG per tablet Take 1 tablet by mouth every 6 (six) hours as needed. For knee pain    Historical Provider, MD  lovastatin (MEVACOR) 20 MG tablet Take 20 mg by mouth at bedtime.    Historical Provider, MD  lovastatin (MEVACOR) 40 MG tablet Take 1 tablet (40 mg total) by mouth at bedtime. 04/02/12   Reuben Likesavid C Keller, MD  Multiple Vitamin (MULITIVITAMIN WITH MINERALS) TABS Take 1 tablet by mouth daily.    Historical Provider, MD  naproxen sodium (ANAPROX) 220 MG tablet Take 220 mg by mouth 2 (two) times daily with a meal.    Historical Provider, MD  oxyCODONE-acetaminophen (PERCOCET) 5-325 MG per tablet 1 to 2 tablets every 6 hours as needed for pain. 04/02/12   Reuben Likesavid C Keller, MD  oxyCODONE-acetaminophen (PERCOCET) 5-325 MG per tablet 1 to 2 tablets every 6 hours  as needed for pain. 12/11/12   Reuben Likesavid C Keller, MD  predniSONE (DELTASONE) 10 MG tablet Take 3 po QD x 4d , then 2 po QD x 4d then 1 po QD x 4d 05/01/11   Ward GivensIva L Knapp, MD  vitamin C (ASCORBIC ACID) 500 MG tablet Take 500 mg by mouth daily.    Historical Provider, MD   BP 164/101  Pulse 81  Temp(Src) 98.2 F (36.8 C) (Oral)  Resp 20  SpO2 97% Physical Exam  Nursing note and vitals reviewed. Constitutional: He is oriented to person, place, and time. He appears well-developed and well-nourished.  HENT:  Right Ear: External ear normal.  Left Ear: External ear normal.  Nose:  Mucosal edema and rhinorrhea present.  Mouth/Throat: Oropharynx is clear and moist.  Neck: Normal range of motion. Neck supple.  Cardiovascular: Normal heart sounds.   Pulmonary/Chest: Effort normal and breath sounds normal.  Musculoskeletal: He exhibits tenderness.       Right knee: He exhibits decreased range of motion, swelling, effusion and abnormal meniscus. He exhibits no LCL laxity and no MCL laxity. Tenderness found. Medial joint line tenderness noted.  Lymphadenopathy:    He has no cervical adenopathy.  Neurological: He is alert and oriented to person, place, and time.  Skin: Skin is warm and dry.    ED Course  Procedures (including critical care time) Labs Review Labs Reviewed - No data to display  Imaging Review No results found.   MDM   1. Acute medial meniscus tear of right knee   2. Seasonal allergic rhinitis   3. Hypertension       Linna HoffJames D Miracle Mongillo, MD 06/23/13 [phone]

## 2013-06-23 NOTE — ED Notes (Signed)
Not in treatment room 

## 2013-11-17 ENCOUNTER — Encounter (HOSPITAL_COMMUNITY): Payer: Self-pay | Admitting: Emergency Medicine

## 2013-11-17 ENCOUNTER — Emergency Department (INDEPENDENT_AMBULATORY_CARE_PROVIDER_SITE_OTHER)
Admission: EM | Admit: 2013-11-17 | Discharge: 2013-11-17 | Disposition: A | Discharge status: Home / Self Care | Payer: Medicaid Other | Source: Home / Self Care | Attending: Family Medicine | Admitting: Family Medicine

## 2013-11-17 DIAGNOSIS — G44209 Tension-type headache, unspecified, not intractable: Secondary | ICD-10-CM

## 2013-11-17 DIAGNOSIS — I10 Essential (primary) hypertension: Secondary | ICD-10-CM

## 2013-11-17 DIAGNOSIS — E669 Obesity, unspecified: Secondary | ICD-10-CM

## 2013-11-17 LAB — POCT I-STAT, CHEM 8
BUN: 6 mg/dL (ref 6–23)
CHLORIDE: 101 meq/L (ref 96–112)
CREATININE: 1 mg/dL (ref 0.50–1.35)
Calcium, Ion: 1.16 mmol/L (ref 1.12–1.23)
Glucose, Bld: 94 mg/dL (ref 70–99)
HCT: 53 % — ABNORMAL HIGH (ref 39.0–52.0)
Hemoglobin: 18 g/dL — ABNORMAL HIGH (ref 13.0–17.0)
Potassium: 3.4 mEq/L — ABNORMAL LOW (ref 3.7–5.3)
SODIUM: 142 meq/L (ref 137–147)
TCO2: 26 mmol/L (ref 0–100)

## 2013-11-17 MED ORDER — LISINOPRIL 20 MG PO TABS: 20.0000 mg | ORAL_TABLET | Freq: Every day | ORAL | Status: DC

## 2013-11-17 MED ORDER — HYDROCHLOROTHIAZIDE 25 MG PO TABS: 25.0000 mg | ORAL_TABLET | Freq: Every day | ORAL | Status: DC

## 2013-11-17 NOTE — ED Notes (Signed)
C/o HA x past 2 days as a result of running out of his BP medication 2 days ago. States we always give him a shot in his back when he has a headache

## 2013-11-17 NOTE — Discharge Instructions (Signed)
DASH Eating Plan °DASH stands for "Dietary Approaches to Stop Hypertension." The DASH eating plan is a healthy eating plan that has been shown to reduce high blood pressure (hypertension). Additional health benefits may include reducing the risk of type 2 diabetes mellitus, heart disease, and stroke. The DASH eating plan may also help with weight loss. °WHAT DO I NEED TO KNOW ABOUT THE DASH EATING PLAN? °For the DASH eating plan, you will follow these general guidelines: °· Choose foods with a percent daily value for sodium of less than 5% (as listed on the food label). °· Use salt-free seasonings or herbs instead of table salt or sea salt. °· Check with your health care provider or pharmacist before using salt substitutes. °· Eat lower-sodium products, often labeled as "lower sodium" or "no salt added." °· Eat fresh foods. °· Eat more vegetables, fruits, and low-fat dairy products. °· Choose whole grains. Look for the word "whole" as the first word in the ingredient list. °· Choose fish and skinless chicken or turkey more often than red meat. Limit fish, poultry, and meat to 6 oz (170 g) each day. °· Limit sweets, desserts, sugars, and sugary drinks. °· Choose heart-healthy fats. °· Limit cheese to 1 oz (28 g) per day. °· Eat more home-cooked food and less restaurant, buffet, and fast food. °· Limit fried foods. °· Cook foods using methods other than frying. °· Limit canned vegetables. If you do use them, rinse them well to decrease the sodium. °· When eating at a restaurant, ask that your food be prepared with less salt, or no salt if possible. °WHAT FOODS CAN I EAT? °Seek help from a dietitian for individual calorie needs. °Grains °Whole grain or whole wheat bread. Brown rice. Whole grain or whole wheat pasta. Quinoa, bulgur, and whole grain cereals. Low-sodium cereals. Corn or whole wheat flour tortillas. Whole grain cornbread. Whole grain crackers. Low-sodium crackers. °Vegetables °Fresh or frozen vegetables  (raw, steamed, roasted, or grilled). Low-sodium or reduced-sodium tomato and vegetable juices. Low-sodium or reduced-sodium tomato sauce and paste. Low-sodium or reduced-sodium canned vegetables.  °Fruits °All fresh, canned (in natural juice), or frozen fruits. °Meat and Other Protein Products °Ground beef (85% or leaner), grass-fed beef, or beef trimmed of fat. Skinless chicken or turkey. Ground chicken or turkey. Pork trimmed of fat. All fish and seafood. Eggs. Dried beans, peas, or lentils. Unsalted nuts and seeds. Unsalted canned beans. °Dairy °Low-fat dairy products, such as skim or 1% milk, 2% or reduced-fat cheeses, low-fat ricotta or cottage cheese, or plain low-fat yogurt. Low-sodium or reduced-sodium cheeses. °Fats and Oils °Tub margarines without trans fats. Light or reduced-fat mayonnaise and salad dressings (reduced sodium). Avocado. Safflower, olive, or canola oils. Natural peanut or almond butter. °Other °Unsalted popcorn and pretzels. °The items listed above may not be a complete list of recommended foods or beverages. Contact your dietitian for more options. °WHAT FOODS ARE NOT RECOMMENDED? °Grains °White bread. White pasta. White rice. Refined cornbread. Bagels and croissants. Crackers that contain trans fat. °Vegetables °Creamed or fried vegetables. Vegetables in a cheese sauce. Regular canned vegetables. Regular canned tomato sauce and paste. Regular tomato and vegetable juices. °Fruits °Dried fruits. Canned fruit in light or heavy syrup. Fruit juice. °Meat and Other Protein Products °Fatty cuts of meat. Ribs, chicken wings, bacon, sausage, bologna, salami, chitterlings, fatback, hot dogs, bratwurst, and packaged luncheon meats. Salted nuts and seeds. Canned beans with salt. °Dairy °Whole or 2% milk, cream, half-and-half, and cream cheese. Whole-fat or sweetened yogurt. Full-fat   cheeses or blue cheese. Nondairy creamers and whipped toppings. Processed cheese, cheese spreads, or cheese  curds. Condiments Onion and garlic salt, seasoned salt, table salt, and sea salt. Canned and packaged gravies. Worcestershire sauce. Tartar sauce. Barbecue sauce. Teriyaki sauce. Soy sauce, including reduced sodium. Steak sauce. Fish sauce. Oyster sauce. Cocktail sauce. Horseradish. Ketchup and mustard. Meat flavorings and tenderizers. Bouillon cubes. Hot sauce. Tabasco sauce. Marinades. Taco seasonings. Relishes. Fats and Oils Butter, stick margarine, lard, shortening, ghee, and bacon fat. Coconut, palm kernel, or palm oils. Regular salad dressings. Other Pickles and olives. Salted popcorn and pretzels. The items listed above may not be a complete list of foods and beverages to avoid. Contact your dietitian for more information. WHERE CAN I FIND MORE INFORMATION? National Heart, [employer], [employer] Institute: [employer] Document Released: 01/19/2011 Document Revised: 06/16/2013 Document Reviewed: 12/04/2012 [facility] Patient Information 2015 [place], [place]. This information is not intended to replace advice given to you by your health care provider. Make sure you discuss any questions you have with your health care provider.  General Headache Without Cause A headache is pain or discomfort felt around the head or neck area. The specific cause of a headache may not be found. There are many causes and types of headaches. A few common ones are:  Tension headaches.  Migraine headaches.  Cluster headaches.  Chronic daily headaches. HOME CARE INSTRUCTIONS   Keep all follow-up appointments with your caregiver or any specialist referral.  Only take over-the-counter or prescription medicines for pain or discomfort as directed by your caregiver.  Lie down in a dark, quiet room when you have a headache.  Keep a headache journal to find out what may trigger your migraine headaches. For example, write down:  What you eat and drink.  How much sleep you  get.  Any change to your diet or medicines.  Try massage or other relaxation techniques.  Put ice packs or heat on the head and neck. Use these 3 to 4 times per day for 15 to 20 minutes each time, or as needed.  Limit stress.  Sit up straight, and do not tense your muscles.  Quit smoking if you smoke.  Limit alcohol use.  Decrease the amount of caffeine you drink, or stop drinking caffeine.  Eat and sleep on a regular schedule.  Get 7 to 9 hours of sleep, or as recommended by your caregiver.  Keep lights dim if bright lights bother you and make your headaches worse. SEEK MEDICAL CARE IF:   You have problems with the medicines you were prescribed.  Your medicines are not working.  You have a change from the usual headache.  You have nausea or vomiting. SEEK IMMEDIATE MEDICAL CARE IF:   Your headache becomes severe.  You have a fever.  You have a stiff neck.  You have loss of vision.  You have muscular weakness or loss of muscle control.  You start losing your balance or have trouble walking.  You feel faint or pass out.  You have severe symptoms that are different from your first symptoms. MAKE SURE YOU:   Understand these instructions.  Will watch your condition.  Will get help right away if you are not doing well or get worse. Document Released: 01/30/2005 Document Revised: 04/24/2011 Document Reviewed: 02/15/2011 [facility] Patient Information 2015 [place], [place]. This information is not intended to replace advice given to you by your health care provider. Make sure you discuss any questions you have with your health care provider.  Headaches, Frequently Asked Questions MIGRAINE HEADACHES Q: What is migraine? What causes it? How can I treat it? A: Generally, migraine headaches begin as a dull ache. Then they develop into a constant, throbbing, and pulsating pain. You may experience pain at the temples. You may experience pain at the front or back of one  or both sides of the head. The pain is usually accompanied by a combination of:  Nausea.  Vomiting.  Sensitivity to light and noise. Some people (about 15%) experience an aura (see below) before an attack. The cause of migraine is believed to be chemical reactions in the brain. Treatment for migraine may include over-the-counter or prescription medications. It may also include self-help techniques. These include relaxation training and biofeedback.  Q: What is an aura? A: About 15% of people with migraine get an "aura". This is a sign of neurological symptoms that occur before a migraine headache. You may see wavy or jagged lines, dots, or flashing lights. You might experience tunnel vision or blind spots in one or both eyes. The aura can include visual or auditory hallucinations (something imagined). It may include disruptions in smell (such as strange odors), taste or touch. Other symptoms include:  Numbness.  A "pins and needles" sensation.  Difficulty in recalling or speaking the correct word. These neurological events may last as long as 60 minutes. These symptoms will fade as the headache begins. Q: What is a trigger? A: Certain physical or environmental factors can lead to or "trigger" a migraine. These include:  Foods.  Hormonal changes.  Weather.  Stress. It is important to remember that triggers are different for everyone. To help prevent migraine attacks, you need to figure out which triggers affect you. Keep a headache diary. This is a good way to track triggers. The diary will help you talk to your healthcare professional about your condition. Q: Does weather affect migraines? A: Bright sunshine, hot, humid conditions, and drastic changes in barometric pressure may lead to, or "trigger," a migraine attack in some people. But studies have shown that weather does not act as a trigger for everyone with migraines. Q: What is the link between migraine and hormones? A: Hormones  start and regulate many of your body's functions. Hormones keep your body in balance within a constantly changing environment. The levels of hormones in your body are unbalanced at times. Examples are during menstruation, pregnancy, or menopause. That can lead to a migraine attack. In fact, about three quarters of all women with migraine report that their attacks are related to the menstrual cycle.  Q: Is there an increased risk of stroke for migraine sufferers? A: The likelihood of a migraine attack causing a stroke is very remote. That is not to say that migraine sufferers cannot have a stroke associated with their migraines. In persons under age 3, the most common associated factor for stroke is migraine headache. But over the course of a person's normal life span, the occurrence of migraine headache may actually be associated with a reduced risk of dying from cerebrovascular disease due to stroke.  Q: What are acute medications for migraine? A: Acute medications are used to treat the pain of the headache after it has started. Examples over-the-counter medications, NSAIDs, ergots, and triptans.  Q: What are the triptans? A: Triptans are the newest class of abortive medications. They are specifically targeted to treat migraine. Triptans are vasoconstrictors. They moderate some chemical reactions in the brain. The triptans work on receptors in your brain. Triptans  help to restore the balance of a neurotransmitter called serotonin. Fluctuations in levels of serotonin are thought to be a main cause of migraine.  Q: Are over-the-counter medications for migraine effective? A: Over-the-counter, or "OTC," medications may be effective in relieving mild to moderate pain and associated symptoms of migraine. But you should see your caregiver before beginning any treatment regimen for migraine.  Q: What are preventive medications for migraine? A: Preventive medications for migraine are sometimes referred to as  "prophylactic" treatments. They are used to reduce the frequency, severity, and length of migraine attacks. Examples of preventive medications include antiepileptic medications, antidepressants, beta-blockers, calcium channel blockers, and NSAIDs (nonsteroidal anti-inflammatory drugs). Q: Why are anticonvulsants used to treat migraine? A: During the past few years, there has been an increased interest in antiepileptic drugs for the prevention of migraine. They are sometimes referred to as "anticonvulsants". Both epilepsy and migraine may be caused by similar reactions in the brain.  Q: Why are antidepressants used to treat migraine? A: Antidepressants are typically used to treat people with depression. They may reduce migraine frequency by regulating chemical levels, such as serotonin, in the brain.  Q: What alternative therapies are used to treat migraine? A: The term "alternative therapies" is often used to describe treatments considered outside the scope of conventional Western medicine. Examples of alternative therapy include acupuncture, acupressure, and yoga. Another common alternative treatment is herbal therapy. Some herbs are believed to relieve headache pain. Always discuss alternative therapies with your caregiver before proceeding. Some herbal products contain arsenic and other toxins. TENSION HEADACHES Q: What is a tension-type headache? What causes it? How can I treat it? A: Tension-type headaches occur randomly. They are often the result of temporary stress, anxiety, fatigue, or anger. Symptoms include soreness in your temples, a tightening band-like sensation around your head (a "vice-like" ache). Symptoms can also include a pulling feeling, pressure sensations, and contracting head and neck muscles. The headache begins in your forehead, temples, or the back of your head and neck. Treatment for tension-type headache may include over-the-counter or prescription medications. Treatment may also  include self-help techniques such as relaxation training and biofeedback. CLUSTER HEADACHES Q: What is a cluster headache? What causes it? How can I treat it? A: Cluster headache gets its name because the attacks come in groups. The pain arrives with little, if any, warning. It is usually on one side of the head. A tearing or bloodshot eye and a runny nose on the same side of the headache may also accompany the pain. Cluster headaches are believed to be caused by chemical reactions in the brain. They have been described as the most severe and intense of any headache type. Treatment for cluster headache includes prescription medication and oxygen. SINUS HEADACHES Q: What is a sinus headache? What causes it? How can I treat it? A: When a cavity in the bones of the face and skull (a sinus) becomes inflamed, the inflammation will cause localized pain. This condition is usually the result of an allergic reaction, a tumor, or an infection. If your headache is caused by a sinus blockage, such as an infection, you will probably have a fever. An x-ray will confirm a sinus blockage. Your caregiver's treatment might include antibiotics for the infection, as well as antihistamines or decongestants.  REBOUND HEADACHES Q: What is a rebound headache? What causes it? How can I treat it? A: A pattern of taking acute headache medications too often can lead to a condition known as "rebound  headache." A pattern of taking too much headache medication includes taking it more than 2 days per week or in excessive amounts. That means more than the label or a caregiver advises. With rebound headaches, your medications not only stop relieving pain, they actually begin to cause headaches. Doctors treat rebound headache by tapering the medication that is being overused. Sometimes your caregiver will gradually substitute a different type of treatment or medication. Stopping may be a challenge. Regularly overusing a medication increases  the potential for serious side effects. Consult a caregiver if you regularly use headache medications more than 2 days per week or more than the label advises. ADDITIONAL QUESTIONS AND ANSWERS Q: What is biofeedback? A: Biofeedback is a self-help treatment. Biofeedback uses special equipment to monitor your body's involuntary physical responses. Biofeedback monitors:  Breathing.  Pulse.  Heart rate.  Temperature.  Muscle tension.  Brain activity. Biofeedback helps you refine and perfect your relaxation exercises. You learn to control the physical responses that are related to stress. Once the technique has been mastered, you do not need the equipment any more. Q: Are headaches hereditary? A: Four out of five (80%) of people that suffer report a family history of migraine. Scientists are not sure if this is genetic or a family predisposition. Despite the uncertainty, a child has a 50% chance of having migraine if one parent suffers. The child has a 75% chance if both parents suffer.  Q: Can children get headaches? A: By the time they reach high school, most young people have experienced some type of headache. Many safe and effective approaches or medications can prevent a headache from occurring or stop it after it has begun.  Q: What type of doctor should I see to diagnose and treat my headache? A: Start with your primary caregiver. Discuss his or her experience and approach to headaches. Discuss methods of classification, diagnosis, and treatment. Your caregiver may decide to recommend you to a headache specialist, depending upon your symptoms or other physical conditions. Having diabetes, allergies, etc., may require a more comprehensive and inclusive approach to your headache. The National Headache Foundation will provide, upon request, a list of [facility] physician members in your state. Document Released: 04/22/2003 Document Revised: 04/24/2011 Document Reviewed: 09/30/2007 [facility] Patient  Information 2015 [place], [place]. This information is not intended to replace advice given to you by your health care provider. Make sure you discuss any questions you have with your health care provider.  Hypertension Hypertension, commonly called high blood pressure, is when the force of blood pumping through your arteries is too strong. Your arteries are the blood vessels that carry blood from your heart throughout your body. A blood pressure reading consists of a higher number over a lower number, such as 110/72. The higher number (systolic) is the pressure inside your arteries when your heart pumps. The lower number (diastolic) is the pressure inside your arteries when your heart relaxes. Ideally you want your blood pressure below 120/80. Hypertension forces your heart to work harder to pump blood. Your arteries may become narrow or stiff. Having hypertension puts you at risk for heart disease, stroke, and other problems.  RISK FACTORS Some risk factors for high blood pressure are controllable. Others are not.  Risk factors you cannot control include:   Race. You may be at higher risk if you are African American.  Age. Risk increases with age.  Gender. Men are at higher risk than women before age 49 years. After age 42, women are  at higher risk than men. Risk factors you can control include:  Not getting enough exercise or physical activity.  Being overweight.  Getting too much fat, sugar, calories, or salt in your diet.  Drinking too much alcohol. SIGNS AND SYMPTOMS Hypertension does not usually cause signs or symptoms. Extremely high blood pressure (hypertensive crisis) may cause headache, anxiety, shortness of breath, and nosebleed. DIAGNOSIS  To check if you have hypertension, your health care provider will measure your blood pressure while you are seated, with your arm held at the level of your heart. It should be measured at least twice using the same arm. Certain conditions can  cause a difference in blood pressure between your right and left arms. A blood pressure reading that is higher than normal on one occasion does not mean that you need treatment. If one blood pressure reading is high, ask your health care provider about having it checked again. TREATMENT  Treating high blood pressure includes making lifestyle changes and possibly taking medicine. Living a healthy lifestyle can help lower high blood pressure. You may need to change some of your habits. Lifestyle changes may include:  Following the DASH diet. This diet is high in fruits, vegetables, and whole grains. It is low in salt, red meat, and added sugars.  Getting at least 2 hours of brisk physical activity every week.  Losing weight if necessary.  Not smoking.  Limiting alcoholic beverages.  Learning ways to reduce stress. If lifestyle changes are not enough to get your blood pressure under control, your health care provider may prescribe medicine. You may need to take more than one. Work closely with your health care provider to understand the risks and benefits. HOME CARE INSTRUCTIONS  Have your blood pressure rechecked as directed by your health care provider.   Take medicines only as directed by your health care provider. Follow the directions carefully. Blood pressure medicines must be taken as prescribed. The medicine does not work as well when you skip doses. Skipping doses also puts you at risk for problems.   Do not smoke.   Monitor your blood pressure at home as directed by your health care provider. SEEK MEDICAL CARE IF:   You think you are having a reaction to medicines taken.  You have recurrent headaches or feel dizzy.  You have swelling in your ankles.  You have trouble with your vision. SEEK IMMEDIATE MEDICAL CARE IF:  You develop a severe headache or confusion.  You have unusual weakness, numbness, or feel faint.  You have severe chest or abdominal pain.  You  vomit repeatedly.  You have trouble breathing. MAKE SURE YOU:   Understand these instructions.  Will watch your condition.  Will get help right away if you are not doing well or get worse. Document Released: 01/30/2005 Document Revised: 06/16/2013 Document Reviewed: 11/22/2012 [facility] Patient Information 2015 [place], [place]. This information is not intended to replace advice given to you by your health care provider. Make sure you discuss any questions you have with your health care provider.  Managing Your High Blood Pressure Blood pressure is a measurement of how forceful your blood is pressing against the walls of the arteries. Arteries are muscular tubes within the circulatory system. Blood pressure does not stay the same. Blood pressure rises when you are active, excited, or nervous; and it lowers during sleep and relaxation. If the numbers measuring your blood pressure stay above normal most of the time, you are at risk for health problems. High  blood pressure (hypertension) is a long-term (chronic) condition in which blood pressure is elevated. A blood pressure reading is recorded as two numbers, such as 120 over 80 (or 120/80). The first, higher number is called the systolic pressure. It is a measure of the pressure in your arteries as the heart beats. The second, lower number is called the diastolic pressure. It is a measure of the pressure in your arteries as the heart relaxes between beats.  Keeping your blood pressure in a normal range is important to your overall health and prevention of health problems, such as heart disease and stroke. When your blood pressure is uncontrolled, your heart has to work harder than normal. High blood pressure is a very common condition in adults because blood pressure tends to rise with age. Men and women are equally likely to have hypertension but at different times in life. Before age 41, men are more likely to have hypertension. After 35 years of age,  women are more likely to have it. Hypertension is especially common in African Americans. This condition often has no signs or symptoms. The cause of the condition is usually not known. Your caregiver can help you come up with a plan to keep your blood pressure in a normal, healthy range. BLOOD PRESSURE STAGES Blood pressure is classified into four stages: normal, prehypertension, stage 1, and stage 2. Your blood pressure reading will be used to determine what type of treatment, if any, is necessary. Appropriate treatment options are tied to these four stages:  Normal  Systolic pressure (mm Hg): below 120.  Diastolic pressure (mm Hg): below 80. Prehypertension  Systolic pressure (mm Hg): 120 to 139.  Diastolic pressure (mm Hg): 80 to 89. Stage1  Systolic pressure (mm Hg): 140 to 159.  Diastolic pressure (mm Hg): 90 to 99. Stage2  Systolic pressure (mm Hg): 160 or above.  Diastolic pressure (mm Hg): 100 or above. RISKS RELATED TO HIGH BLOOD PRESSURE Managing your blood pressure is an important responsibility. Uncontrolled high blood pressure can lead to:  A heart attack.  A stroke.  A weakened blood vessel (aneurysm).  Heart failure.  Kidney damage.  Eye damage.  Metabolic syndrome.  Memory and concentration problems. HOW TO MANAGE YOUR BLOOD PRESSURE Blood pressure can be managed effectively with lifestyle changes and medicines (if needed). Your caregiver will help you come up with a plan to bring your blood pressure within a normal range. Your plan should include the following: Education  Read all information provided by your caregivers about how to control blood pressure.  Educate yourself on the latest guidelines and treatment recommendations. New research is always being done to further define the risks and treatments for high blood pressure. Lifestylechanges  Control your weight.  Avoid smoking.  Stay physically active.  Reduce the amount of salt in  your diet.  Reduce stress.  Control any chronic conditions, such as high cholesterol or diabetes.  Reduce your alcohol intake. Medicines  Several medicines (antihypertensive medicines) are available, if needed, to bring blood pressure within a normal range. Communication  Review all the medicines you take with your caregiver because there may be side effects or interactions.  Talk with your caregiver about your diet, exercise habits, and other lifestyle factors that may be contributing to high blood pressure.  See your caregiver regularly. Your caregiver can help you create and adjust your plan for managing high blood pressure. RECOMMENDATIONS FOR TREATMENT AND FOLLOW-UP  The following recommendations are based on current guidelines for  managing high blood pressure in nonpregnant adults. Use these recommendations to identify the proper follow-up period or treatment option based on your blood pressure reading. You can discuss these options with your caregiver.  Systolic pressure of 120 to 139 or diastolic pressure of 80 to 89: Follow up with your caregiver as directed.  Systolic pressure of 140 to 160 or diastolic pressure of 90 to 100: Follow up with your caregiver within 2 months.  Systolic pressure above 160 or diastolic pressure above 100: Follow up with your caregiver within 1 month.  Systolic pressure above 180 or diastolic pressure above 110: Consider antihypertensive therapy; follow up with your caregiver within 1 week.  Systolic pressure above 200 or diastolic pressure above 120: Begin antihypertensive therapy; follow up with your caregiver within 1 week. Document Released: 10/25/2011 Document Reviewed: 10/25/2011 [facility] Patient Information 2015 [place], [place]. This information is not intended to replace advice given to you by your health care provider. Make sure you discuss any questions you have with your health care provider.  Obesity Obesity is defined as having too  much total body fat and a body mass index (BMI) of 30 or more. BMI is an estimate of body fat and is calculated from your height and weight. Obesity happens when you consume more calories than you can burn by exercising or performing daily physical tasks. Prolonged obesity can cause major illnesses or emergencies, such as:   Stroke.  Heart disease.  Diabetes.  Cancer.  Arthritis.  High blood pressure (hypertension).  High cholesterol.  Sleep apnea.  Erectile dysfunction.  Infertility problems. CAUSES   Regularly eating unhealthy foods.  Physical inactivity.  Certain disorders, such as an underactive thyroid (hypothyroidism), Cushing's syndrome, and polycystic ovarian syndrome.  Certain medicines, such as steroids, some depression medicines, and antipsychotics.  Genetics.  Lack of sleep. DIAGNOSIS  A health care provider can diagnose obesity after calculating your BMI. Obesity will be diagnosed if your BMI is 30 or higher.  There are other methods of measuring obesity levels. Some other methods include measuring your skinfold thickness, your waist circumference, and comparing your hip circumference to your waist circumference. TREATMENT  A healthy treatment program includes some or all of the following:  Long-term dietary changes.  Exercise and physical activity.  Behavioral and lifestyle changes.  Medicine only under the supervision of your health care provider. Medicines may help, but only if they are used with diet and exercise programs. An unhealthy treatment program includes:  Fasting.  Fad diets.  Supplements and drugs. These choices do not succeed in long-term weight control.  HOME CARE INSTRUCTIONS   Exercise and perform physical activity as directed by your health care provider. To increase physical activity, try the following:  Use stairs instead of elevators.  Park farther away from store entrances.  Garden, bike, or walk instead of watching  television or using the computer.  Eat healthy, low-calorie foods and drinks on a regular basis. Eat more fruits and vegetables. Use low-calorie cookbooks or take healthy cooking classes.  Limit fast food, sweets, and processed snack foods.  Eat smaller portions.  Keep a daily journal of everything you eat. There are many free websites to help you with this. It may be helpful to measure your foods so you can determine if you are eating the correct portion sizes.  Avoid drinking alcohol. Drink more water and drinks without calories.  Take vitamins and supplements only as recommended by your health care provider.  Weight-loss support groups, registered dietitians,  counselors, and stress reduction education can also be very helpful. SEEK IMMEDIATE MEDICAL CARE IF:  You have chest pain or tightness.  You have trouble breathing or feel short of breath.  You have weakness or leg numbness.  You feel confused or have trouble talking.  You have sudden changes in your vision. MAKE SURE YOU:  Understand these instructions.  Will watch your condition.  Will get help right away if you are not doing well or get worse. Document Released: 03/09/2004 Document Revised: 06/16/2013 Document Reviewed: 03/08/2011 [facility] Patient Information 2015 [place], [place]. This information is not intended to replace advice given to you by your health care provider. Make sure you discuss any questions you have with your health care provider.  Tension Headache A tension headache is a feeling of pain, pressure, or aching often felt over the front and sides of the head. The pain can be dull or can feel tight (constricting). It is the most common type of headache. Tension headaches are not normally associated with nausea or vomiting and do not get worse with physical activity. Tension headaches can last 30 minutes to several days.  CAUSES  The exact cause is not known, but it may be caused by chemicals and  hormones in the brain that lead to pain. Tension headaches often begin after stress, anxiety, or depression. Other triggers may include:  Alcohol.  Caffeine (too much or withdrawal).  Respiratory infections (colds, flu, sinus infections).  Dental problems or teeth clenching.  Fatigue.  Holding your head and neck in one position too long while using a computer. SYMPTOMS   Pressure around the head.   Dull, aching head pain.   Pain felt over the front and sides of the head.   Tenderness in the muscles of the head, neck, and shoulders. DIAGNOSIS  A tension headache is often diagnosed based on:   Symptoms.   Physical examination.   A CT scan or MRI of your head. These tests may be ordered if symptoms are severe or unusual. TREATMENT  Medicines may be given to help relieve symptoms.  HOME CARE INSTRUCTIONS   Only take over-the-counter or prescription medicines for pain or discomfort as directed by your caregiver.   Lie down in a dark, quiet room when you have a headache.   Keep a journal to find out what may be triggering your headaches. For example, write down:  What you eat and drink.  How much sleep you get.  Any change to your diet or medicines.  Try massage or other relaxation techniques.   Ice packs or heat applied to the head and neck can be used. Use these 3 to 4 times per day for 15 to 20 minutes each time, or as needed.   Limit stress.   Sit up straight, and do not tense your muscles.   Quit smoking if you smoke.  Limit alcohol use.  Decrease the amount of caffeine you drink, or stop drinking caffeine.  Eat and exercise regularly.  Get 7 to 9 hours of sleep, or as recommended by your caregiver.  Avoid excessive use of pain medicine as recurrent headaches can occur.  SEEK MEDICAL CARE IF:   You have problems with the medicines you were prescribed.  Your medicines do not work.  You have a change from the usual headache.  You have  nausea or vomiting. SEEK IMMEDIATE MEDICAL CARE IF:   Your headache becomes severe.  You have a fever.  You have a stiff neck.  You have loss of vision.  You have muscular weakness or loss of muscle control.  You lose your balance or have trouble walking.  You feel faint or pass out.  You have severe symptoms that are different from your first symptoms. MAKE SURE YOU:   Understand these instructions.  Will watch your condition.  Will get help right away if you are not doing well or get worse. Document Released: 01/30/2005 Document Revised: 04/24/2011 Document Reviewed: 01/20/2011 [facility] Patient Information 2015 [place], [place]. This information is not intended to replace advice given to you by your health care provider. Make sure you discuss any questions you have with your health care provider.

## 2013-11-17 NOTE — ED Provider Notes (Signed)
CSN: [id number]     Arrival date & time 11/17/13  1444 History   First MD Initiated Contact with Patient 11/17/13 1600     Chief Complaint  Patient presents with  . Headache   (Consider location/radiation/quality/duration/timing/severity/associated sxs/prior Treatment) HPI Comments: States he feels symptoms began when he ran out of his blood pressure medication (HCTZ) 2-3 days ago. Has had some relief with ibuprofen. Has not contacted his PCP for evaluation or BP medication refill.  PCP: [facility] [address]  In [place], [place] Works as Naval architect.  No fever No neck pain No recent illness or head injury   Patient is a 33 y.o. male presenting with headaches. The history is provided by the patient.  Headache Pain location:  R temporal and L temporal Quality:  Dull Radiates to:  Does not radiate Onset quality:  Gradual Duration:  2 days Timing:  Constant Progression:  Unchanged Chronicity:  New Similar to prior headaches: yes   Associated symptoms: no dizziness, no numbness and no seizures     Past Medical History  Diagnosis Date  . Hypertension   . Hypercholesteremia    Past Surgical History  Procedure Laterality Date  . Orthopedic surgery     History reviewed. No pertinent family history. History  Substance Use Topics  . Smoking status: Current Some Day Smoker    Types: Cigars  . Smokeless tobacco: Not on file  . Alcohol Use: Yes     Comment: socially    Review of Systems  Constitutional: Negative.   Eyes: Negative.   Respiratory: Negative.   Cardiovascular: Negative.   Gastrointestinal: Negative.   Genitourinary: Negative.   Musculoskeletal: Negative.   Skin: Negative.   Neurological: Positive for headaches. Negative for dizziness, tremors, seizures, syncope, facial asymmetry, speech difficulty, weakness, light-headedness and numbness.  Psychiatric/Behavioral: Negative.   All other systems reviewed and are negative.   Allergies   Tea  Home Medications   Prior to Admission medications   Medication Sig Start Date End Date Taking? Authorizing Provider  acetaminophen (TYLENOL) 325 MG tablet Take 650 mg by mouth every 6 (six) hours as needed. For pain relief    Historical Provider, MD  cetirizine (ZYRTEC) 10 MG tablet Take 1 tablet (10 mg total) by mouth daily. One tab daily for allergies 06/23/13   Linna Hoff, MD  diclofenac (CATAFLAM) 50 MG tablet Take 1 tablet (50 mg total) by mouth 3 (three) times daily. For knee pain 06/23/13   Linna Hoff, MD  diclofenac (VOLTAREN) 75 MG EC tablet Take 1 tablet (75 mg total) by mouth 2 (two) times daily. 04/02/12   Reuben Likes, MD  fluticasone (FLONASE) 50 MCG/ACT nasal spray Place 1 spray into both nostrils 2 (two) times daily. 06/23/13   Linna Hoff, MD  hydrochlorothiazide (HYDRODIURIL) 25 MG tablet Take 1 tablet (25 mg total) by mouth daily. 05/01/11 04/30/12  Ward Givens, MD  hydrochlorothiazide (HYDRODIURIL) 25 MG tablet Take 1 tablet (25 mg total) by mouth daily. 06/01/11   Delanna Notice, MD  hydrochlorothiazide (HYDRODIURIL) 25 MG tablet Take 1 tablet (25 mg total) by mouth daily. 04/02/12   Reuben Likes, MD  hydrochlorothiazide (HYDRODIURIL) 25 MG tablet Take 1 tablet (25 mg total) by mouth daily. 12/11/12   Reuben Likes, MD  hydrochlorothiazide (HYDRODIURIL) 25 MG tablet Take 1 tablet (25 mg total) by mouth daily. 06/23/13   Linna Hoff, MD  hydrochlorothiazide (HYDRODIURIL) 25 MG tablet Take 1 tablet (25  mg total) by mouth daily. 11/17/13   Mathis Fare Ayodele Sangalang, PA  HYDROcodone-acetaminophen (NORCO) 5-325 MG per tablet Take 1 tablet by mouth every 6 (six) hours as needed. For knee pain    Historical Provider, MD  lisinopril (PRINIVIL,ZESTRIL) 20 MG tablet Take 1 tablet (20 mg total) by mouth daily. 11/17/13   Mathis Fare Kenyatte Chatmon, PA  lovastatin (MEVACOR) 20 MG tablet Take 20 mg by mouth at bedtime.    Historical Provider, MD  lovastatin (MEVACOR) 40 MG tablet Take 1  tablet (40 mg total) by mouth at bedtime. 04/02/12   Reuben Likes, MD  Multiple Vitamin (MULITIVITAMIN WITH MINERALS) TABS Take 1 tablet by mouth daily.    Historical Provider, MD  naproxen sodium (ANAPROX) 220 MG tablet Take 220 mg by mouth 2 (two) times daily with a meal.    Historical Provider, MD  oxyCODONE-acetaminophen (PERCOCET) 5-325 MG per tablet 1 to 2 tablets every 6 hours as needed for pain. 04/02/12   Reuben Likes, MD  oxyCODONE-acetaminophen (PERCOCET) 5-325 MG per tablet 1 to 2 tablets every 6 hours as needed for pain. 12/11/12   Reuben Likes, MD  predniSONE (DELTASONE) 10 MG tablet Take 3 po QD x 4d , then 2 po QD x 4d then 1 po QD x 4d 05/01/11   Ward Givens, MD  vitamin C (ASCORBIC ACID) 500 MG tablet Take 500 mg by mouth daily.    Historical Provider, MD   BP 182/101  Pulse 83  Temp(Src) 98.2 F (36.8 C) (Oral)  Resp 16  SpO2 97% Physical Exam  Nursing note and vitals reviewed. Constitutional: He is oriented to person, place, and time. He appears well-developed and well-nourished. No distress.  +obese  HENT:  Head: Normocephalic and atraumatic.  Right Ear: External ear normal.  Left Ear: External ear normal.  Nose: Nose normal.  Mouth/Throat: Oropharynx is clear and moist.  Eyes: Conjunctivae and EOM are normal. Pupils are equal, round, and reactive to light. Right eye exhibits no discharge. Left eye exhibits no discharge. No scleral icterus.  Neck: Normal range of motion. Neck supple. No thyromegaly present.  Cardiovascular: Normal rate, regular rhythm and normal heart sounds.   Pulmonary/Chest: Effort normal and breath sounds normal.  Musculoskeletal: Normal range of motion.  Lymphadenopathy:    He has no cervical adenopathy.  Neurological: He is alert and oriented to person, place, and time. He has normal strength. No cranial nerve deficit or sensory deficit. Coordination and gait normal. GCS eye subscore is 4. GCS verbal subscore is 5. GCS motor subscore is 6.   Skin: Skin is warm and dry. No rash noted. No erythema.  Psychiatric: He has a normal mood and affect. His behavior is normal.    ED Course  Procedures (including critical care time) Labs Review Labs Reviewed  POCT I-STAT, CHEM 8 - Abnormal; Notable for the following:    Potassium 3.4 (*)    Hemoglobin 18.0 (*)    HCT 53.0 (*)    All other components within normal limits    Imaging Review No results found.   MDM   1. Tension headache   2. Essential hypertension   3. Obesity    Clinical exam does not suggest acute intracranial process. Non-specific bitemporal tension-type headache. I explained to patient about the importance of taking antihypertensive medication on a daily basis and maintaining routine follow up with PCP for management. Advised that if symptoms become suddenly worse or severe, he should report directly  to his nearest ER for re-evaluation. Mild hypokalemia on Istat. Will advise taking daily multivitamin or eating potassium rich foods for replacement.   Ria ClockJennifer Lee H Jalani Cullifer, [place]PA 11/17/13 [phone]

## 2013-11-19 NOTE — ED Provider Notes (Signed)
Medical screening examination/treatment/procedure(s) were performed by a resident physician or non-physician practitioner and as the supervising physician I was immediately available for consultation/collaboration.  Evan Corey, MD    Evan S Corey, MD 11/19/13 0758 

## 2013-12-30 ENCOUNTER — Emergency Department (INDEPENDENT_AMBULATORY_CARE_PROVIDER_SITE_OTHER): Payer: Medicaid Other

## 2013-12-30 ENCOUNTER — Encounter (HOSPITAL_COMMUNITY): Payer: Self-pay | Admitting: *Deleted

## 2013-12-30 ENCOUNTER — Emergency Department (HOSPITAL_COMMUNITY)
Admission: EM | Admit: 2013-12-30 | Discharge: 2013-12-30 | Disposition: A | Discharge status: Home / Self Care | Payer: Medicaid Other | Source: Home / Self Care | Attending: Emergency Medicine | Admitting: Emergency Medicine

## 2013-12-30 DIAGNOSIS — S63104A Unspecified dislocation of right thumb, initial encounter: Secondary | ICD-10-CM

## 2013-12-30 DIAGNOSIS — M25539 Pain in unspecified wrist: Secondary | ICD-10-CM

## 2013-12-30 DIAGNOSIS — S63106A Unspecified dislocation of unspecified thumb, initial encounter: Secondary | ICD-10-CM

## 2013-12-30 DIAGNOSIS — I10 Essential (primary) hypertension: Secondary | ICD-10-CM

## 2013-12-30 DIAGNOSIS — S63501A Unspecified sprain of right wrist, initial encounter: Secondary | ICD-10-CM | POA: Diagnosis not present

## 2013-12-30 MED ORDER — HYDROCODONE-ACETAMINOPHEN 5-325 MG PO TABS: 1.0000 | ORAL_TABLET | Freq: Four times a day (QID) | ORAL | Status: DC | PRN

## 2013-12-30 MED ORDER — MELOXICAM 15 MG PO TABS: 15.0000 mg | ORAL_TABLET | Freq: Every day | ORAL | Status: DC

## 2013-12-30 MED ORDER — HYDROCHLOROTHIAZIDE 25 MG PO TABS: 25.0000 mg | ORAL_TABLET | Freq: Every day | ORAL | Status: DC

## 2013-12-30 NOTE — ED Notes (Signed)
Pt  Reports   r   Wrist     Pain         X    3   Weeks      States    He  Injured      It      As   Well  As  His  r  Thumb  At  The  Same  Time    He  Also  Reports   He is  Out  Of  His  bp  meds

## 2013-12-30 NOTE — Discharge Instructions (Signed)
You have sprain to your wrist and thumb. Apply ice 3 times a day. Take meloxicam 1 pill daily for the next week, then as needed. Do not take this medication with ibuprofen. Use Norco as needed for severe pain. Wear the brace during the day. Make sure you remove it and do range of motion exercises 2-3 times a day.  Follow-up if not improving in 1-2 weeks.

## 2013-12-30 NOTE — ED Provider Notes (Signed)
CSN: [id number]     Arrival date & time 12/30/13  1403 History   First MD Initiated Contact with Patient 12/30/13 1507     Chief Complaint  Patient presents with  . Wrist Pain   (Consider location/radiation/quality/duration/timing/severity/associated sxs/prior Treatment) HPI  He is a 33 year old man here for evaluation of right thumb and wrist pain. He states about 3 weeks ago he was throwing a football when his son hit his arm to block him. He states his thumb dislocated and he popped it back into place. Since then he has had pain along the radial aspect of the thumb as well as numbness on the radial and palmar aspects. He has pain with flexion and extension of the thumb. Other than the numbness, he states the thumb has gradually been improving over the last several weeks. However, as the thumb started to improve he developed pain and swelling in the radial wrist. It is worse with radial deviation of the hand.  He also states he needs a refill of his blood pressure medication. He is on hydrochlorothiazide 25 mg daily. He's been out of this medication for the last 2 days. He is supposed to take lisinopril as well, but he states it makes him feel loopy.  Past Medical History  Diagnosis Date  . Hypertension   . Hypercholesteremia    Past Surgical History  Procedure Laterality Date  . Orthopedic surgery     History reviewed. No pertinent family history. History  Substance Use Topics  . Smoking status: Current Some Day Smoker    Types: Cigars  . Smokeless tobacco: Not on file  . Alcohol Use: Yes     Comment: socially    Review of Systems As in history of present illness. Allergies  Tea  Home Medications   Prior to Admission medications   Medication Sig Start Date End Date Taking? Authorizing Provider  acetaminophen (TYLENOL) 325 MG tablet Take 650 mg by mouth every 6 (six) hours as needed. For pain relief    Historical Provider, MD  cetirizine (ZYRTEC) 10 MG tablet Take 1  tablet (10 mg total) by mouth daily. One tab daily for allergies 06/23/13   Linna HoffJames D Kindl, MD  diclofenac (CATAFLAM) 50 MG tablet Take 1 tablet (50 mg total) by mouth 3 (three) times daily. For knee pain 06/23/13   Linna HoffJames D Kindl, MD  diclofenac (VOLTAREN) 75 MG EC tablet Take 1 tablet (75 mg total) by mouth 2 (two) times daily. 04/02/12   Reuben Likesavid C Keller, MD  fluticasone (FLONASE) 50 MCG/ACT nasal spray Place 1 spray into both nostrils 2 (two) times daily. 06/23/13   Linna HoffJames D Kindl, MD  hydrochlorothiazide (HYDRODIURIL) 25 MG tablet Take 1 tablet (25 mg total) by mouth daily. 12/30/13   Charm RingsErin J Honig, MD  HYDROcodone-acetaminophen (NORCO/VICODIN) 5-325 MG per tablet Take 1 tablet by mouth every 6 (six) hours as needed for moderate pain. 12/30/13   Charm RingsErin J Honig, MD  lisinopril (PRINIVIL,ZESTRIL) 20 MG tablet Take 1 tablet (20 mg total) by mouth daily. 11/17/13   Mathis FareJennifer Lee H Presson, PA  lovastatin (MEVACOR) 20 MG tablet Take 20 mg by mouth at bedtime.    Historical Provider, MD  lovastatin (MEVACOR) 40 MG tablet Take 1 tablet (40 mg total) by mouth at bedtime. 04/02/12   Reuben Likesavid C Keller, MD  meloxicam (MOBIC) 15 MG tablet Take 1 tablet (15 mg total) by mouth daily. 12/30/13   Charm RingsErin J Honig, MD  Multiple Vitamin (MULITIVITAMIN WITH MINERALS) TABS Take  1 tablet by mouth daily.    Historical Provider, MD  naproxen sodium (ANAPROX) 220 MG tablet Take 220 mg by mouth 2 (two) times daily with a meal.    Historical Provider, MD  oxyCODONE-acetaminophen (PERCOCET) 5-325 MG per tablet 1 to 2 tablets every 6 hours as needed for pain. 04/02/12   Reuben Likesavid C Keller, MD  oxyCODONE-acetaminophen (PERCOCET) 5-325 MG per tablet 1 to 2 tablets every 6 hours as needed for pain. 12/11/12   Reuben Likesavid C Keller, MD  predniSONE (DELTASONE) 10 MG tablet Take 3 po QD x 4d , then 2 po QD x 4d then 1 po QD x 4d 05/01/11   Ward GivensIva L Knapp, MD  vitamin C (ASCORBIC ACID) 500 MG tablet Take 500 mg by mouth daily.    Historical Provider, MD   BP 147/97  mmHg  Pulse 80  Temp(Src) 99.5 F (37.5 C) (Oral)  Resp 16  SpO2 98% Physical Exam  Constitutional: He is oriented to person, place, and time. He appears well-developed and well-nourished. No distress.  Cardiovascular: Normal rate.   Pulmonary/Chest: Effort normal.  Musculoskeletal:  Right thumb: tender over proximal proximal phalanx.  Pain with extremes of flexion and extension.  No laxity of the joint appreciated.  Decreased sensation to radial and palmar thumb.  No scaphoid tenderness.  Right wrist: mild swelling over dorsal radial wrist.  Tender over extensor carpi radialis.  Full active ROM.  Pain with resisted radial deviation.  Neurological: He is alert and oriented to person, place, and time.    ED Course  Procedures (including critical care time) Labs Review Labs Reviewed - No data to display  Imaging Review Dg Wrist Complete Right  12/30/2013   CLINICAL DATA:  33 year old male with persistent right thumb and wrist pain. Reportedly dislocated the right some 3 weeks ago while playing football.  EXAM: RIGHT WRIST - COMPLETE 3+ VIEW  COMPARISON:  Concurrently obtained radiographs of the right some  FINDINGS: There is no evidence of fracture or dislocation. There is no evidence of arthropathy or other focal bone abnormality. Soft tissues are unremarkable.  IMPRESSION: Negative.   Electronically Signed   By: Malachy MoanHeath  McCullough M.D.   On: 12/30/2013 16:05   Dg Finger Thumb Right  12/30/2013   CLINICAL DATA:  33 year old male with persistent thumb pain and history of dislocation 3 weeks ago while playing football.  EXAM: RIGHT THUMB 2+V  COMPARISON:  Concurrently obtained radiographs of the wrist  FINDINGS: There is no evidence of fracture or dislocation. There is no evidence of arthropathy or other focal bone abnormality. Soft tissues are unremarkable  IMPRESSION: Negative.   Electronically Signed   By: Malachy MoanHeath  McCullough M.D.   On: 12/30/2013 16:05     MDM   1. Right wrist  sprain, initial encounter   2. Thumb dislocation   3. Wrist pain    X-rays negative for fracture. Will apply wrist brace for comfort. Meloxicam daily for the next week. Norco as needed for severe pain. Discussed that the numbness will likely take weeks to months to resolve. Expect improvement in 1-2 weeks.  HCTZ 25 mg given for blood pressure.     Charm RingsErin J Honig, MD 12/30/13 [phone]

## 2014-02-03 ENCOUNTER — Emergency Department (HOSPITAL_COMMUNITY)
Admission: EM | Admit: 2014-02-03 | Discharge: 2014-02-04 | Disposition: A | Discharge status: Home / Self Care | Payer: Medicaid Other | Attending: Emergency Medicine | Admitting: Emergency Medicine

## 2014-02-03 ENCOUNTER — Encounter (HOSPITAL_COMMUNITY): Payer: Self-pay

## 2014-02-03 DIAGNOSIS — R079 Chest pain, unspecified: Secondary | ICD-10-CM

## 2014-02-03 DIAGNOSIS — R51 Headache: Secondary | ICD-10-CM

## 2014-02-03 DIAGNOSIS — R0789 Other chest pain: Secondary | ICD-10-CM | POA: Insufficient documentation

## 2014-02-03 DIAGNOSIS — R200 Anesthesia of skin: Secondary | ICD-10-CM | POA: Diagnosis not present

## 2014-02-03 DIAGNOSIS — R519 Headache, unspecified: Secondary | ICD-10-CM

## 2014-02-03 DIAGNOSIS — Z8673 Personal history of transient ischemic attack (TIA), and cerebral infarction without residual deficits: Secondary | ICD-10-CM | POA: Insufficient documentation

## 2014-02-03 DIAGNOSIS — R0602 Shortness of breath: Secondary | ICD-10-CM | POA: Insufficient documentation

## 2014-02-03 DIAGNOSIS — I160 Hypertensive urgency: Secondary | ICD-10-CM

## 2014-02-03 DIAGNOSIS — Z791 Long term (current) use of non-steroidal anti-inflammatories (NSAID): Secondary | ICD-10-CM | POA: Diagnosis not present

## 2014-02-03 DIAGNOSIS — E780 Pure hypercholesterolemia: Secondary | ICD-10-CM | POA: Diagnosis not present

## 2014-02-03 DIAGNOSIS — Z79899 Other long term (current) drug therapy: Secondary | ICD-10-CM | POA: Insufficient documentation

## 2014-02-03 DIAGNOSIS — I10 Essential (primary) hypertension: Secondary | ICD-10-CM | POA: Insufficient documentation

## 2014-02-03 DIAGNOSIS — Z720 Tobacco use: Secondary | ICD-10-CM | POA: Insufficient documentation

## 2014-02-03 DIAGNOSIS — Z7951 Long term (current) use of inhaled steroids: Secondary | ICD-10-CM | POA: Diagnosis not present

## 2014-02-03 DIAGNOSIS — R55 Syncope and collapse: Secondary | ICD-10-CM | POA: Diagnosis not present

## 2014-02-03 HISTORY — DX: Cerebral infarction, unspecified: I63.9

## 2014-02-03 MED ORDER — MORPHINE SULFATE 4 MG/ML IJ SOLN
4.0000 mg | Freq: Once | INTRAMUSCULAR | Status: AC
  Administered 2014-02-04: 4 mg via INTRAVENOUS
  Filled 2014-02-03: qty 1

## 2014-02-03 MED ORDER — NITROGLYCERIN IN D5W 200-5 MCG/ML-% IV SOLN
0.0000 ug/min | INTRAVENOUS | Status: DC
  Administered 2014-02-04: 5 ug/min via INTRAVENOUS
  Filled 2014-02-03: qty 250

## 2014-02-03 NOTE — ED Notes (Signed)
Per EMS: pt was having an argument with his significant other when he had syncopal episode. Pt reported chest pain "sharp and tightness" upon EMS arrival.  Pt with hx of HTN and a stroke.  Pt given 324 of ASA PTA.  Pt reporting numbness to left arm and left face.  Pt hypertensive at 214/120 with EMS.  Refused nitro with EMS due to side effect of ha.

## 2014-02-03 NOTE — ED Provider Notes (Signed)
CSN: [id number]     Arrival date & time 02/03/14  2341 History   First MD Initiated Contact with Patient 02/03/14 2342    This chart was scribed for Olivia Mackielga M Tadarrius Burch, MD by Marica OtterNusrat Rahman, ED Scribe. This patient was seen in room D30C/D30C and the patient's care was started at 11:44 PM.  Chief Complaint  Patient presents with  . Hypertension  . Chest Pain   HPI PCP: No PCP Per Patient HPI Comments: [name] [name] is a 33 y.o. male, brought in by ambulance, with Hx of HTN, chronic joint pain, tobacco use, drug use (marijuana), and stroke (2012, [facility] who presents to the Emergency Department complaining of sudden onset sharp chest pain with associated SOB, numbness to left arm and left side of his face and syncope. Per EMS, pt was having a heated conversation with his girlfriend when he began to experience sharp chest pain and tightness. Pt rates his chest pain an 8 out of 10. Per EMS: (1) pt was given 324 of ASA PTA; (2) pt was hypertensive at 214/120; and (3) pt refused nitro with EMS due to side effects of HA. Pt reports taking hydrochlorothiazide and Meloxicam and reports he is compliant with both meds. Pt denies: (1) taking baby aspirin; (2) being followed by neurology; (3) Hx of irregular heartbeat.    Past Medical History  Diagnosis Date  . Hypertension   . Hypercholesteremia   . Stroke    Past Surgical History  Procedure Laterality Date  . Orthopedic surgery     No family history on file. History  Substance Use Topics  . Smoking status: Current Some Day Smoker    Types: Cigars  . Smokeless tobacco: Not on file  . Alcohol Use: Yes     Comment: socially    Review of Systems  Respiratory: Positive for chest tightness and shortness of breath.   Cardiovascular: Positive for chest pain.  Neurological: Positive for syncope and numbness.  Psychiatric/Behavioral: Negative for confusion.      Allergies  Tea  Home Medications   Prior to Admission  medications   Medication Sig Start Date End Date Taking? Authorizing Provider  acetaminophen (TYLENOL) 325 MG tablet Take 650 mg by mouth every 6 (six) hours as needed. For pain relief    Historical Provider, MD  cetirizine (ZYRTEC) 10 MG tablet Take 1 tablet (10 mg total) by mouth daily. One tab daily for allergies 06/23/13   Linna HoffJames D Kindl, MD  diclofenac (CATAFLAM) 50 MG tablet Take 1 tablet (50 mg total) by mouth 3 (three) times daily. For knee pain 06/23/13   Linna HoffJames D Kindl, MD  diclofenac (VOLTAREN) 75 MG EC tablet Take 1 tablet (75 mg total) by mouth 2 (two) times daily. 04/02/12   Reuben Likesavid C Keller, MD  fluticasone (FLONASE) 50 MCG/ACT nasal spray Place 1 spray into both nostrils 2 (two) times daily. 06/23/13   Linna HoffJames D Kindl, MD  hydrochlorothiazide (HYDRODIURIL) 25 MG tablet Take 1 tablet (25 mg total) by mouth daily. 12/30/13   Charm RingsErin J Honig, MD  HYDROcodone-acetaminophen (NORCO/VICODIN) 5-325 MG per tablet Take 1 tablet by mouth every 6 (six) hours as needed for moderate pain. 12/30/13   Charm RingsErin J Honig, MD  lisinopril (PRINIVIL,ZESTRIL) 20 MG tablet Take 1 tablet (20 mg total) by mouth daily. 11/17/13   Mathis FareJennifer Lee H Presson, PA  lovastatin (MEVACOR) 20 MG tablet Take 20 mg by mouth at bedtime.    Historical Provider, MD  lovastatin (  MEVACOR) 40 MG tablet Take 1 tablet (40 mg total) by mouth at bedtime. 04/02/12   Reuben Likesavid C Keller, MD  meloxicam (MOBIC) 15 MG tablet Take 1 tablet (15 mg total) by mouth daily. 12/30/13   Charm RingsErin J Honig, MD  Multiple Vitamin (MULITIVITAMIN WITH MINERALS) TABS Take 1 tablet by mouth daily.    Historical Provider, MD  naproxen sodium (ANAPROX) 220 MG tablet Take 220 mg by mouth 2 (two) times daily with a meal.    Historical Provider, MD  oxyCODONE-acetaminophen (PERCOCET) 5-325 MG per tablet 1 to 2 tablets every 6 hours as needed for pain. 04/02/12   Reuben Likesavid C Keller, MD  oxyCODONE-acetaminophen (PERCOCET) 5-325 MG per tablet 1 to 2 tablets every 6 hours as needed for pain.  12/11/12   Reuben Likesavid C Keller, MD  predniSONE (DELTASONE) 10 MG tablet Take 3 po QD x 4d , then 2 po QD x 4d then 1 po QD x 4d 05/01/11   Ward GivensIva L Knapp, MD  vitamin C (ASCORBIC ACID) 500 MG tablet Take 500 mg by mouth daily.    Historical Provider, MD   Triage Vitals: BP 166/103 mmHg  Pulse 83  Temp(Src) 98.5 F (36.9 C) (Oral)  Resp 17  Ht 6\' 4"  (1.93 m)  Wt 360 lb (163.295 kg)  BMI 43.84 kg/m2  SpO2 96% Physical Exam  Constitutional: He is oriented to person, place, and time. He appears well-developed and well-nourished. He appears distressed.  HENT:  Head: Normocephalic and atraumatic.  Right Ear: External ear normal.  Left Ear: External ear normal.  Nose: Nose normal.  Mouth/Throat: Oropharynx is clear and moist.  Eyes: Conjunctivae and EOM are normal. Pupils are equal, round, and reactive to light.  Neck: Normal range of motion. Neck supple. No JVD present. No tracheal deviation present. No thyromegaly present.  Cardiovascular: Normal rate, regular rhythm, normal heart sounds and intact distal pulses.  Exam reveals no gallop and no friction rub.   No murmur heard. Pulmonary/Chest: Effort normal and breath sounds normal. No stridor. No respiratory distress. He has no wheezes. He has no rales. He exhibits no tenderness.  Abdominal: Soft. Bowel sounds are normal. He exhibits no distension and no mass. There is no tenderness. There is no rebound and no guarding.  Musculoskeletal: Normal range of motion. He exhibits no edema or tenderness.  Lymphadenopathy:    He has no cervical adenopathy.  Neurological: He is alert and oriented to person, place, and time. He displays normal reflexes. No cranial nerve deficit. He exhibits normal muscle tone. Coordination normal.  Skin: Skin is warm and dry. No rash noted. No erythema. No pallor.  Psychiatric: He has a normal mood and affect. His behavior is normal. Judgment and thought content normal.  Nursing note and vitals reviewed.   ED Course   Procedures (including critical care time) DIAGNOSTIC STUDIES: Oxygen Saturation is 96% on RA, adequate by my interpretation.    COORDINATION OF CARE: 11:57 PM-Discussed treatment plan which includes labs, meds, imaging, and EKG with pt at bedside and pt agreed to plan.   Labs Review Labs Reviewed  BASIC METABOLIC PANEL - Abnormal; Notable for the following:    Potassium 2.7 (*)    Glucose, Bld 101 (*)    GFR calc non Af Amer 89 (*)    All other components within normal limits  CBC WITH DIFFERENTIAL  TROPONIN I  BRAIN NATRIURETIC PEPTIDE  URINALYSIS, ROUTINE W REFLEX MICROSCOPIC  URINE RAPID DRUG SCREEN (HOSP PERFORMED)    Imaging  Review Dg Chest 2 View  02/04/2014   CLINICAL DATA:  Acute onset of left-sided chest pain. Question of mediastinal prominence on portable radiograph. Initial encounter.  EXAM: CHEST  2 VIEW  COMPARISON:  Chest radiograph performed earlier today at 12:06 a.m.  FINDINGS: The lungs are mildly hypoexpanded. Mild vascular congestion and vascular crowding are noted. Minimal bilateral atelectasis is seen. Apparent basilar opacity on the lateral view is thought to reflect atelectasis, given the lack of associated pulmonary symptoms. There is no evidence of pleural effusion or pneumothorax.  The cardiomediastinal silhouette is borderline normal in size; previously noted mediastinal prominence has improved and likely reflected technique. No acute osseous abnormalities are seen.  IMPRESSION: Lungs mildly hypoexpanded, with minimal bilateral atelectasis. Apparent basilar opacity on the lateral view is thought to reflect atelectasis, given the lack of associated pulmonary symptoms. Mild vascular congestion noted. The cardiomediastinal silhouette remains normal in size.   Electronically Signed   By: Roanna Raider M.D.   On: 02/04/2014 02:54   Ct Head Wo Contrast  02/04/2014   CLINICAL DATA:  Acute onset of syncope. Status post fall, with injury to left side of face and  head, and left-sided facial numbness. Initial encounter.  EXAM: CT HEAD WITHOUT CONTRAST  TECHNIQUE: Contiguous axial images were obtained from the base of the skull through the vertex without intravenous contrast.  COMPARISON:  CT of the head performed 07/21/2010  FINDINGS: There is no evidence of acute infarction, mass lesion, or intra- or extra-axial hemorrhage on CT.  The posterior fossa, including the cerebellum, brainstem and fourth ventricle, is within normal limits. The third and lateral ventricles, and basal ganglia are unremarkable in appearance. The cerebral hemispheres are symmetric in appearance, with normal gray-white differentiation. No mass effect or midline shift is seen.  There is no evidence of fracture; visualized osseous structures are unremarkable in appearance. The orbits are within normal limits. The paranasal sinuses and mastoid air cells are well-aerated. No significant soft tissue abnormalities are seen.  IMPRESSION: Unremarkable noncontrast CT of the head.   Electronically Signed   By: Roanna Raider M.D.   On: 02/04/2014 02:01   Dg Chest Portable 1 View  02/04/2014   CLINICAL DATA:  Initial evaluation for centralized chest pains. Syncope.  EXAM: PORTABLE CHEST - 1 VIEW  COMPARISON:  Prior radiograph from 11/29/2010  FINDINGS: There is accentuation of the cardiac silhouette, likely related AP technique and lordotic angulation. Mediastinal silhouette within normal limits.  Lungs are mildly hypoinflated. There is diffuse pulmonary vascular congestion, which may in part be related to shallow lung inflation. No focal infiltrates. No pleural effusion. No pneumothorax.  No acute osseus abnormality.  IMPRESSION: Shallow lung inflation with secondary pulmonary vascular congestion and bronchovascular crowding. No definite focal airspace disease.   Electronically Signed   By: Rise Mu M.D.   On: 02/04/2014 01:43     EKG Interpretation   Date/Time:  Tuesday February 03 2014  23:48:36 EST Ventricular Rate:  80 PR Interval:  155 QRS Duration: 86 QT Interval:  383 QTC Calculation: 442 R Axis:   50 Text Interpretation:  Sinus rhythm Borderline T abnormalities, inferior  leads Confirmed by Haivyn Oravec  MD, Jaise Moser ([id number]) on 02/04/2014 12:23:52 AM      MDM   Final diagnoses:  Syncope  Nonintractable episodic headache, unspecified headache type  Essential hypertension  Hypertensive urgency    33 year old male with syncope, chest pain, arm pain, numbness, associated with significant hypertension after argument.  Patient has history of  migraines, hypertension, hypertensive urgency with TIA in the past.  Patient be started on nitro drip to help with chest pain and hypertensive urgency.  Plan for labs, EKG, chest x-ray and CT scan.     Patient noted be hypokalemic, will start runs of potassium.  Patient is currently asymptomatic.  Blood pressure improved headache and chest pain has resolved.  Chest x-ray with possible pulmonary edema, and widened mediastinum, plan for PA and lateral films.  Pt off nitro, bp normalized.  All symptoms resolved.  Repeat CXR unremarkable.  Pt wishing to go home.  Will have him f/u with pcm.  I personally performed the services described in this documentation, which was scribed in my presence. The recorded information has been reviewed and is accurate.     Olivia Mackie, MD 02/04/14 [phone]

## 2014-02-04 ENCOUNTER — Encounter (HOSPITAL_COMMUNITY): Payer: Self-pay

## 2014-02-04 ENCOUNTER — Emergency Department (HOSPITAL_COMMUNITY): Payer: Medicaid Other

## 2014-02-04 LAB — CBC WITH DIFFERENTIAL/PLATELET
Basophils Absolute: 0.1 10*3/uL (ref 0.0–0.1)
Basophils Relative: 1 % (ref 0–1)
Eosinophils Absolute: 0.2 10*3/uL (ref 0.0–0.7)
Eosinophils Relative: 2 % (ref 0–5)
HCT: 42 % (ref 39.0–52.0)
Hemoglobin: 14.5 g/dL (ref 13.0–17.0)
LYMPHS ABS: 2.5 10*3/uL (ref 0.7–4.0)
LYMPHS PCT: 25 % (ref 12–46)
MCH: 29 pg (ref 26.0–34.0)
MCHC: 34.5 g/dL (ref 30.0–36.0)
MCV: 84 fL (ref 78.0–100.0)
Monocytes Absolute: 0.9 10*3/uL (ref 0.1–1.0)
Monocytes Relative: 9 % (ref 3–12)
NEUTROS ABS: 6.4 10*3/uL (ref 1.7–7.7)
NEUTROS PCT: 63 % (ref 43–77)
PLATELETS: 230 10*3/uL (ref 150–400)
RBC: 5 MIL/uL (ref 4.22–5.81)
RDW: 14.2 % (ref 11.5–15.5)
WBC: 10.1 10*3/uL (ref 4.0–10.5)

## 2014-02-04 LAB — BASIC METABOLIC PANEL
ANION GAP: 8 (ref 5–15)
BUN: 9 mg/dL (ref 6–23)
CHLORIDE: 103 meq/L (ref 96–112)
CO2: 25 mmol/L (ref 19–32)
Calcium: 9 mg/dL (ref 8.4–10.5)
Creatinine, Ser: 1.06 mg/dL (ref 0.50–1.35)
GFR calc Af Amer: >90 mL/min (ref >90)
GFR calc non Af Amer: 89 mL/min — ABNORMAL LOW (ref >90)
GLUCOSE: 101 mg/dL — AB (ref 70–99)
POTASSIUM: 2.7 mmol/L — AB (ref 3.5–5.1)
SODIUM: 136 mmol/L (ref 135–145)

## 2014-02-04 LAB — BRAIN NATRIURETIC PEPTIDE: B NATRIURETIC PEPTIDE 5: 37.4 pg/mL (ref 0.0–100.0)

## 2014-02-04 LAB — TROPONIN I

## 2014-02-04 MED ORDER — METOCLOPRAMIDE HCL 5 MG/ML IJ SOLN
10.0000 mg | Freq: Once | INTRAMUSCULAR | Status: AC
  Administered 2014-02-04: 10 mg via INTRAVENOUS
  Filled 2014-02-04: qty 2

## 2014-02-04 MED ORDER — DIPHENHYDRAMINE HCL 50 MG/ML IJ SOLN
25.0000 mg | Freq: Once | INTRAMUSCULAR | Status: AC
  Administered 2014-02-04: 25 mg via INTRAVENOUS
  Filled 2014-02-04: qty 1

## 2014-02-04 MED ORDER — KETOROLAC TROMETHAMINE 30 MG/ML IJ SOLN
30.0000 mg | Freq: Once | INTRAMUSCULAR | Status: AC
  Administered 2014-02-04: 30 mg via INTRAVENOUS
  Filled 2014-02-04: qty 1

## 2014-02-04 MED ORDER — POTASSIUM CHLORIDE 10 MEQ/100ML IV SOLN
10.0000 meq | INTRAVENOUS | Status: AC
  Administered 2014-02-04 (×2): 10 meq via INTRAVENOUS
  Filled 2014-02-04 (×2): qty 100

## 2014-02-04 NOTE — ED Notes (Signed)
Pt a/o x 4 on d/c with steady gait with family. 

## 2014-02-04 NOTE — Discharge Instructions (Signed)
DASH Eating Plan °DASH stands for "Dietary Approaches to Stop Hypertension." The DASH eating plan is a healthy eating plan that has been shown to reduce high blood pressure (hypertension). Additional health benefits may include reducing the risk of type 2 diabetes mellitus, heart disease, and stroke. The DASH eating plan may also help with weight loss. °WHAT DO I NEED TO KNOW ABOUT THE DASH EATING PLAN? °For the DASH eating plan, you will follow these general guidelines: °· Choose foods with a percent daily value for sodium of less than 5% (as listed on the food label). °· Use salt-free seasonings or herbs instead of table salt or sea salt. °· Check with your health care provider or pharmacist before using salt substitutes. °· Eat lower-sodium products, often labeled as "lower sodium" or "no salt added." °· Eat fresh foods. °· Eat more vegetables, fruits, and low-fat dairy products. °· Choose whole grains. Look for the word "whole" as the first word in the ingredient list. °· Choose fish and skinless chicken or turkey more often than red meat. Limit fish, poultry, and meat to 6 oz (170 g) each day. °· Limit sweets, desserts, sugars, and sugary drinks. °· Choose heart-healthy fats. °· Limit cheese to 1 oz (28 g) per day. °· Eat more home-cooked food and less restaurant, buffet, and fast food. °· Limit fried foods. °· Cook foods using methods other than frying. °· Limit canned vegetables. If you do use them, rinse them well to decrease the sodium. °· When eating at a restaurant, ask that your food be prepared with less salt, or no salt if possible. °WHAT FOODS CAN I EAT? °Seek help from a dietitian for individual calorie needs. °Grains °Whole grain or whole wheat bread. Brown rice. Whole grain or whole wheat pasta. Quinoa, bulgur, and whole grain cereals. Low-sodium cereals. Corn or whole wheat flour tortillas. Whole grain cornbread. Whole grain crackers. Low-sodium crackers. °Vegetables °Fresh or frozen vegetables  (raw, steamed, roasted, or grilled). Low-sodium or reduced-sodium tomato and vegetable juices. Low-sodium or reduced-sodium tomato sauce and paste. Low-sodium or reduced-sodium canned vegetables.  °Fruits °All fresh, canned (in natural juice), or frozen fruits. °Meat and Other Protein Products °Ground beef (85% or leaner), grass-fed beef, or beef trimmed of fat. Skinless chicken or turkey. Ground chicken or turkey. Pork trimmed of fat. All fish and seafood. Eggs. Dried beans, peas, or lentils. Unsalted nuts and seeds. Unsalted canned beans. °Dairy °Low-fat dairy products, such as skim or 1% milk, 2% or reduced-fat cheeses, low-fat ricotta or cottage cheese, or plain low-fat yogurt. Low-sodium or reduced-sodium cheeses. °Fats and Oils °Tub margarines without trans fats. Light or reduced-fat mayonnaise and salad dressings (reduced sodium). Avocado. Safflower, olive, or canola oils. Natural peanut or almond butter. °Other °Unsalted popcorn and pretzels. °The items listed above may not be a complete list of recommended foods or beverages. Contact your dietitian for more options. °WHAT FOODS ARE NOT RECOMMENDED? °Grains °White bread. White pasta. White rice. Refined cornbread. Bagels and croissants. Crackers that contain trans fat. °Vegetables °Creamed or fried vegetables. Vegetables in a cheese sauce. Regular canned vegetables. Regular canned tomato sauce and paste. Regular tomato and vegetable juices. °Fruits °Dried fruits. Canned fruit in light or heavy syrup. Fruit juice. °Meat and Other Protein Products °Fatty cuts of meat. Ribs, chicken wings, bacon, sausage, bologna, salami, chitterlings, fatback, hot dogs, bratwurst, and packaged luncheon meats. Salted nuts and seeds. Canned beans with salt. °Dairy °Whole or 2% milk, cream, half-and-half, and cream cheese. Whole-fat or sweetened yogurt. Full-fat   cheeses or blue cheese. Nondairy creamers and whipped toppings. Processed cheese, cheese spreads, or cheese  curds. °Condiments °Onion and garlic salt, seasoned salt, table salt, and sea salt. Canned and packaged gravies. Worcestershire sauce. Tartar sauce. Barbecue sauce. Teriyaki sauce. Soy sauce, including reduced sodium. Steak sauce. Fish sauce. Oyster sauce. Cocktail sauce. Horseradish. Ketchup and mustard. Meat flavorings and tenderizers. Bouillon cubes. Hot sauce. Tabasco sauce. Marinades. Taco seasonings. Relishes. °Fats and Oils °Butter, stick margarine, lard, shortening, ghee, and bacon fat. Coconut, palm kernel, or palm oils. Regular salad dressings. °Other °Pickles and olives. Salted popcorn and pretzels. °The items listed above may not be a complete list of foods and beverages to avoid. Contact your dietitian for more information. °WHERE CAN I FIND MORE INFORMATION? °[employer], [employer]: www.nhlbi.nih.gov/health/health-topics/topics/dash/ °Document Released: 01/19/2011 Document Revised: 06/16/2013 Document Reviewed: 12/04/2012 °Exit[employer]® Patient Information ©2015 [employer]. This information is not intended to replace advice given to you by your health care provider. Make sure you discuss any questions you have with your health care provider. ° °Managing Your High Blood Pressure °Blood pressure is a measurement of how forceful your blood is pressing against the walls of the arteries. Arteries are muscular tubes within the circulatory system. Blood pressure does not stay the same. Blood pressure rises when you are active, excited, or nervous; and it lowers during sleep and relaxation. If the numbers measuring your blood pressure stay above normal most of the time, you are at risk for health problems. High blood pressure (hypertension) is a long-term (chronic) condition in which blood pressure is elevated. °A blood pressure reading is recorded as two numbers, such as 120 over 80 (or 120/80). The first, higher number is called the systolic pressure. It is a measure of the pressure in your  arteries as the heart beats. The second, lower number is called the diastolic pressure. It is a measure of the pressure in your arteries as the heart relaxes between beats.  °Keeping your blood pressure in a normal range is important to your overall health and prevention of health problems, such as heart disease and stroke. When your blood pressure is uncontrolled, your heart has to work harder than normal. High blood pressure is a very common condition in adults because blood pressure tends to rise with age. Men and women are equally likely to have hypertension but at different times in life. Before age 45, men are more likely to have hypertension. After 35 years of age, women are more likely to have it. Hypertension is especially common in African Americans. This condition often has no signs or symptoms. The cause of the condition is usually not known. Your caregiver can help you come up with a plan to keep your blood pressure in a normal, healthy range. °BLOOD PRESSURE STAGES °Blood pressure is classified into four stages: normal, prehypertension, stage 1, and stage 2. Your blood pressure reading will be used to determine what type of treatment, if any, is necessary. Appropriate treatment options are tied to these four stages:  °Normal °· Systolic pressure (mm Hg): below 120. °· Diastolic pressure (mm Hg): below 80. °Prehypertension °· Systolic pressure (mm Hg): 120 to 139. °· Diastolic pressure (mm Hg): 80 to 89. °Stage 1 °· Systolic pressure (mm Hg): 140 to 159. °· Diastolic pressure (mm Hg): 90 to 99. °Stage 2 °· Systolic pressure (mm Hg): 160 or above. °· Diastolic pressure (mm Hg): 100 or above. °RISKS RELATED TO HIGH BLOOD PRESSURE °Managing your blood pressure is an important responsibility.   Uncontrolled high blood pressure can lead to:  A heart attack.  A stroke.  A weakened blood vessel (aneurysm).  Heart failure.  Kidney damage.  Eye damage.  Metabolic syndrome.  Memory and concentration  problems. HOW TO MANAGE YOUR BLOOD PRESSURE Blood pressure can be managed effectively with lifestyle changes and medicines (if needed). Your caregiver will help you come up with a plan to bring your blood pressure within a normal range. Your plan should include the following: Education  Read all information provided by your caregivers about how to control blood pressure.  Educate yourself on the latest guidelines and treatment recommendations. New research is always being done to further define the risks and treatments for high blood pressure. Lifestylechanges  Control your weight.  Avoid smoking.  Stay physically active.  Reduce the amount of salt in your diet.  Reduce stress.  Control any chronic conditions, such as high cholesterol or diabetes.  Reduce your alcohol intake. Medicines  Several medicines (antihypertensive medicines) are available, if needed, to bring blood pressure within a normal range. Communication  Review all the medicines you take with your caregiver because there may be side effects or interactions.  Talk with your caregiver about your diet, exercise habits, and other lifestyle factors that may be contributing to high blood pressure.  See your caregiver regularly. Your caregiver can help you create and adjust your plan for managing high blood pressure. RECOMMENDATIONS FOR TREATMENT AND FOLLOW-UP  The following recommendations are based on current guidelines for managing high blood pressure in nonpregnant adults. Use these recommendations to identify the proper follow-up period or treatment option based on your blood pressure reading. You can discuss these options with your caregiver.  Systolic pressure of 120 to 139 or diastolic pressure of 80 to 89: Follow up with your caregiver as directed.  Systolic pressure of 140 to 160 or diastolic pressure of 90 to 100: Follow up with your caregiver within 2 months.  Systolic pressure above 160 or diastolic  pressure above 100: Follow up with your caregiver within 1 month.  Systolic pressure above 180 or diastolic pressure above 110: Consider antihypertensive therapy; follow up with your caregiver within 1 week.  Systolic pressure above 200 or diastolic pressure above 120: Begin antihypertensive therapy; follow up with your caregiver within 1 week. Document Released: 10/25/2011 Document Reviewed: 10/25/2011 [facility] Patient Information 2015 [place], [place]. This information is not intended to replace advice given to you by your health care provider. Make sure you discuss any questions you have with your health care provider.  Migraine Headache A migraine headache is an intense, throbbing pain on one or both sides of your head. A migraine can last for 30 minutes to several hours. CAUSES  The exact cause of a migraine headache is not always known. However, a migraine may be caused when nerves in the brain become irritated and release chemicals that cause inflammation. This causes pain. Certain things may also trigger migraines, such as:  Alcohol.  Smoking.  Stress.  Menstruation.  Aged cheeses.  Foods or drinks that contain nitrates, glutamate, aspartame, or tyramine.  Lack of sleep.  Chocolate.  Caffeine.  Hunger.  Physical exertion.  Fatigue.  Medicines used to treat chest pain (nitroglycerine), birth control pills, estrogen, and some blood pressure medicines. SIGNS AND SYMPTOMS  Pain on one or both sides of your head.  Pulsating or throbbing pain.  Severe pain that prevents daily activities.  Pain that is aggravated by any physical activity.  Nausea, vomiting, or  both.  Dizziness.  Pain with exposure to bright lights, loud noises, or activity.  General sensitivity to bright lights, loud noises, or smells. Before you get a migraine, you may get warning signs that a migraine is coming (aura). An aura may include:  Seeing flashing lights.  Seeing bright spots,  halos, or zigzag lines.  Having tunnel vision or blurred vision.  Having feelings of numbness or tingling.  Having trouble talking.  Having muscle weakness. DIAGNOSIS  A migraine headache is often diagnosed based on:  Symptoms.  Physical exam.  A CT scan or MRI of your head. These imaging tests cannot diagnose migraines, but they can help rule out other causes of headaches. TREATMENT Medicines may be given for pain and nausea. Medicines can also be given to help prevent recurrent migraines.  HOME CARE INSTRUCTIONS  Only take over-the-counter or prescription medicines for pain or discomfort as directed by your health care provider. The use of long-term narcotics is not recommended.  Lie down in a dark, quiet room when you have a migraine.  Keep a journal to find out what may trigger your migraine headaches. For example, write down:  What you eat and drink.  How much sleep you get.  Any change to your diet or medicines.  Limit alcohol consumption.  Quit smoking if you smoke.  Get 7-9 hours of sleep, or as recommended by your health care provider.  Limit stress.  Keep lights dim if bright lights bother you and make your migraines worse. SEEK IMMEDIATE MEDICAL CARE IF:   Your migraine becomes severe.  You have a fever.  You have a stiff neck.  You have vision loss.  You have muscular weakness or loss of muscle control.  You start losing your balance or have trouble walking.  You feel faint or pass out.  You have severe symptoms that are different from your first symptoms. MAKE SURE YOU:   Understand these instructions.  Will watch your condition.  Will get help right away if you are not doing well or get worse. Document Released: 01/30/2005 Document Revised: 06/16/2013 Document Reviewed: 10/07/2012 [facility] Patient Information 2015 [place], [place]. This information is not intended to replace advice given to you by your health care provider. Make sure you  discuss any questions you have with your health care provider.  Syncope Syncope is a medical term for fainting or passing out. This means you lose consciousness and drop to the ground. People are generally unconscious for less than 5 minutes. You may have some muscle twitches for up to 15 seconds before waking up and returning to normal. Syncope occurs more often in older adults, but it can happen to anyone. While most causes of syncope are not dangerous, syncope can be a sign of a serious medical problem. It is important to seek medical care.  CAUSES  Syncope is caused by a sudden drop in blood flow to the brain. The specific cause is often not determined. Factors that can bring on syncope include:  Taking medicines that lower blood pressure.  Sudden changes in posture, such as standing up quickly.  Taking more medicine than prescribed.  Standing in one place for too long.  Seizure disorders.  Dehydration and excessive exposure to heat.  Low blood sugar (hypoglycemia).  Straining to have a bowel movement.  Heart disease, irregular heartbeat, or other circulatory problems.  Fear, emotional distress, seeing blood, or severe pain. SYMPTOMS  Right before fainting, you may:  Feel dizzy or light-headed.  Feel  nauseous.  See all white or all black in your field of vision.  Have cold, clammy skin. DIAGNOSIS  Your health care provider will ask about your symptoms, perform a physical exam, and perform an electrocardiogram (ECG) to record the electrical activity of your heart. Your health care provider may also perform other heart or blood tests to determine the cause of your syncope which may include:  Transthoracic echocardiogram (TTE). During echocardiography, sound waves are used to evaluate how blood flows through your heart.  Transesophageal echocardiogram (TEE).  Cardiac monitoring. This allows your health care provider to monitor your heart rate and rhythm in real  time.  Holter monitor. This is a portable device that records your heartbeat and can help diagnose heart arrhythmias. It allows your health care provider to track your heart activity for several days, if needed.  Stress tests by exercise or by giving medicine that makes the heart beat faster. TREATMENT  In most cases, no treatment is needed. Depending on the cause of your syncope, your health care provider may recommend changing or stopping some of your medicines. HOME CARE INSTRUCTIONS  Have someone stay with you until you feel stable.  Do not drive, use machinery, or play sports until your health care provider says it is okay.  Keep all follow-up appointments as directed by your health care provider.  Lie down right away if you start feeling like you might faint. Breathe deeply and steadily. Wait until all the symptoms have passed.  Drink enough fluids to keep your urine clear or pale yellow.  If you are taking blood pressure or heart medicine, get up slowly and take several minutes to sit and then stand. This can reduce dizziness. SEEK IMMEDIATE MEDICAL CARE IF:   You have a severe headache.  You have unusual pain in the chest, abdomen, or back.  You are bleeding from your mouth or rectum, or you have black or tarry stool.  You have an irregular or very fast heartbeat.  You have pain with breathing.  You have repeated fainting or seizure-like jerking during an episode.  You faint when sitting or lying down.  You have confusion.  You have trouble walking.  You have severe weakness.  You have vision problems. If you fainted, call your local emergency services (911 in [place] Do not drive yourself to the hospital.  MAKE SURE YOU:  Understand these instructions.  Will watch your condition.  Will get help right away if you are not doing well or get worse. Document Released: 01/30/2005 Document Revised: 02/04/2013 Document Reviewed: 03/31/2011 [facility] Patient  Information 2015 [place], [place]. This information is not intended to replace advice given to you by your health care provider. Make sure you discuss any questions you have with your health care provider.

## 2014-02-04 NOTE — ED Notes (Signed)
EDP notified of critical K+ 2.7

## 2014-05-01 ENCOUNTER — Encounter (HOSPITAL_COMMUNITY): Payer: Self-pay | Admitting: Emergency Medicine

## 2014-05-01 ENCOUNTER — Emergency Department (INDEPENDENT_AMBULATORY_CARE_PROVIDER_SITE_OTHER)
Admission: EM | Admit: 2014-05-01 | Discharge: 2014-05-01 | Disposition: A | Discharge status: Home / Self Care | Payer: Medicaid Other | Source: Home / Self Care | Attending: Emergency Medicine | Admitting: Emergency Medicine

## 2014-05-01 DIAGNOSIS — I10 Essential (primary) hypertension: Secondary | ICD-10-CM

## 2014-05-01 DIAGNOSIS — R519 Headache, unspecified: Secondary | ICD-10-CM

## 2014-05-01 DIAGNOSIS — F17210 Nicotine dependence, cigarettes, uncomplicated: Secondary | ICD-10-CM

## 2014-05-01 DIAGNOSIS — R51 Headache: Secondary | ICD-10-CM

## 2014-05-01 DIAGNOSIS — R0982 Postnasal drip: Secondary | ICD-10-CM | POA: Diagnosis not present

## 2014-05-01 DIAGNOSIS — E669 Obesity, unspecified: Secondary | ICD-10-CM

## 2014-05-01 DIAGNOSIS — R059 Cough, unspecified: Secondary | ICD-10-CM

## 2014-05-01 DIAGNOSIS — R05 Cough: Secondary | ICD-10-CM | POA: Diagnosis not present

## 2014-05-01 DIAGNOSIS — Z720 Tobacco use: Secondary | ICD-10-CM | POA: Diagnosis not present

## 2014-05-01 MED ORDER — ONDANSETRON 4 MG PO TBDP
ORAL_TABLET | ORAL | Status: AC
  Filled 2014-05-01: qty 1

## 2014-05-01 MED ORDER — KETOROLAC TROMETHAMINE 60 MG/2ML IM SOLN
INTRAMUSCULAR | Status: AC
  Filled 2014-05-01: qty 2

## 2014-05-01 MED ORDER — KETOROLAC TROMETHAMINE 60 MG/2ML IM SOLN
60.0000 mg | Freq: Once | INTRAMUSCULAR | Status: AC
  Administered 2014-05-01: 60 mg via INTRAMUSCULAR

## 2014-05-01 MED ORDER — CHLORPHENIRAMINE-DM 2-15 MG/5ML PO LIQD: ORAL | Status: DC

## 2014-05-01 MED ORDER — ONDANSETRON 4 MG PO TBDP
4.0000 mg | ORAL_TABLET | Freq: Once | ORAL | Status: AC
  Administered 2014-05-01: 4 mg via ORAL

## 2014-05-01 MED ORDER — DEXAMETHASONE SODIUM PHOSPHATE 10 MG/ML IJ SOLN
10.0000 mg | Freq: Once | INTRAMUSCULAR | Status: AC
  Administered 2014-05-01: 10 mg via INTRAMUSCULAR

## 2014-05-01 MED ORDER — ONDANSETRON HCL 4 MG PO TABS: 4.0000 mg | ORAL_TABLET | Freq: Four times a day (QID) | ORAL | Status: DC

## 2014-05-01 MED ORDER — DEXAMETHASONE SODIUM PHOSPHATE 10 MG/ML IJ SOLN
INTRAMUSCULAR | Status: AC
  Filled 2014-05-01: qty 1

## 2014-05-01 NOTE — Discharge Instructions (Signed)
Hypertension °Hypertension, commonly called high blood pressure, is when the force of blood pumping through your arteries is too strong. Your arteries are the blood vessels that carry blood from your heart throughout your body. A blood pressure reading consists of a higher number over a lower number, such as 110/72. The higher number (systolic) is the pressure inside your arteries when your heart pumps. The lower number (diastolic) is the pressure inside your arteries when your heart relaxes. Ideally you want your blood pressure below 120/80. °Hypertension forces your heart to work harder to pump blood. Your arteries may become narrow or stiff. Having hypertension puts you at risk for heart disease, stroke, and other problems.  °RISK FACTORS °Some risk factors for high blood pressure are controllable. Others are not.  °Risk factors you cannot control include:  °· Race. You may be at higher risk if you are African American. °· Age. Risk increases with age. °· Gender. Men are at higher risk than women before age 45 years. After age 65, women are at higher risk than men. °Risk factors you can control include: °· Not getting enough exercise or physical activity. °· Being overweight. °· Getting too much fat, sugar, calories, or salt in your diet. °· Drinking too much alcohol. °SIGNS AND SYMPTOMS °Hypertension does not usually cause signs or symptoms. Extremely high blood pressure (hypertensive crisis) may cause headache, anxiety, shortness of breath, and nosebleed. °DIAGNOSIS  °To check if you have hypertension, your health care provider will measure your blood pressure while you are seated, with your arm held at the level of your heart. It should be measured at least twice using the same arm. Certain conditions can cause a difference in blood pressure between your right and left arms. A blood pressure reading that is higher than normal on one occasion does not mean that you need treatment. If one blood pressure reading  is high, ask your health care provider about having it checked again. °TREATMENT  °Treating high blood pressure includes making lifestyle changes and possibly taking medicine. Living a healthy lifestyle can help lower high blood pressure. You may need to change some of your habits. °Lifestyle changes may include: °· Following the DASH diet. This diet is high in fruits, vegetables, and whole grains. It is low in salt, red meat, and added sugars. °· Getting at least 2½ hours of brisk physical activity every week. °· Losing weight if necessary. °· Not smoking. °· Limiting alcoholic beverages. °· Learning ways to reduce stress. ° If lifestyle changes are not enough to get your blood pressure under control, your health care provider may prescribe medicine. You may need to take more than one. Work closely with your health care provider to understand the risks and benefits. °HOME CARE INSTRUCTIONS °· Have your blood pressure rechecked as directed by your health care provider.   °· Take medicines only as directed by your health care provider. Follow the directions carefully. Blood pressure medicines must be taken as prescribed. The medicine does not work as well when you skip doses. Skipping doses also puts you at risk for problems.   °· Do not smoke.   °· Monitor your blood pressure at home as directed by your health care provider.  °SEEK MEDICAL CARE IF:  °· You think you are having a reaction to medicines taken. °· You have recurrent headaches or feel dizzy. °· You have swelling in your ankles. °· You have trouble with your vision. °SEEK IMMEDIATE MEDICAL CARE IF: °· You develop a severe headache or confusion. °·   You have unusual weakness, numbness, or feel faint.  You have severe chest or abdominal pain.  You vomit repeatedly.  You have trouble breathing. MAKE SURE YOU:   Understand these instructions.  Will watch your condition.  Will get help right away if you are not doing well or get worse. Document  Released: 01/30/2005 Document Revised: 06/16/2013 Document Reviewed: 11/22/2012 [facility] Patient Information 2015 [place], [place]. This information is not intended to replace advice given to you by your health care provider. Make sure you discuss any questions you have with your health care provider.  Nicotine Addiction Nicotine can act as both a stimulant (excites/activates) and a sedative (calms/quiets). Immediately after exposure to nicotine, there is a "kick" caused in part by the drug's stimulation of the adrenal glands and resulting discharge of adrenaline (epinephrine). The rush of adrenaline stimulates the body and causes a sudden release of sugar. This means that smokers are always slightly hyperglycemic. Hyperglycemic means that the blood sugar is high, just like in diabetics. Nicotine also decreases the amount of insulin which helps control sugar levels in the body. There is an increase in blood pressure, breathing, and the rate of heart beats.  In addition, nicotine indirectly causes a release of dopamine in the brain that controls pleasure and motivation. A similar reaction is seen with other drugs of abuse, such as cocaine and heroin. This dopamine release is thought to cause the pleasurable sensations when smoking. In some different cases, nicotine can also create a calming effect, depending on sensitivity of the smoker's nervous system and the dose of nicotine taken. WHAT HAPPENS WHEN NICOTINE IS TAKEN FOR LONG PERIODS OF TIME?  Long-term use of nicotine results in addiction. It is difficult to stop.  Repeated use of nicotine creates tolerance. Higher doses of nicotine are needed to get the "kick." When nicotine use is stopped, withdrawal may last a month or more. Withdrawal may begin within a few hours after the last cigarette. Symptoms peak within the first few days and may lessen within a few weeks. For some people, however, symptoms may last for months or longer. Withdrawal symptoms  include:   Irritability.  Craving.  Learning and attention deficits.  Sleep disturbances.  Increased appetite. Craving for tobacco may last for 6 months or longer. Many behaviors done while using nicotine can also play a part in the severity of withdrawal symptoms. For some people, the feel, smell, and sight of a cigarette and the ritual of obtaining, handling, lighting, and smoking the cigarette are closely linked with the pleasure of smoking. When stopped, they also miss the related behaviors which make the withdrawal or craving worse. While nicotine gum and patches may lessen the drug aspects of withdrawal, cravings often persist. WHAT ARE THE MEDICAL CONSEQUENCES OF NICOTINE USE?  Nicotine addiction accounts for one-third of all cancers. The top cancer caused by tobacco is lung cancer. Lung cancer is the number one cancer killer of both men and women.  Smoking is also associated with cancers of the:  Mouth.  Pharynx.  Larynx.  Esophagus.  Stomach.  Pancreas.  Cervix.  Kidney.  Ureter.  Bladder.  Smoking also causes lung diseases such as lasting (chronic) bronchitis and emphysema.  It worsens asthma in adults and children.  Smoking increases the risk of heart disease, including:  Stroke.  Heart attack.  Vascular disease.  Aneurysm.  Passive or secondary smoke can also increase medical risks including:  Asthma in children.  Sudden Infant Death Syndrome (SIDS).  Additionally, dropped  cigarettes are the leading cause of residential fire fatalities.  Nicotine poisoning has been reported from accidental ingestion of tobacco products by children and pets. Death usually results in a few minutes from respiratory failure (when a person stops breathing) caused by paralysis. TREATMENT   Medication. Nicotine replacement medicines such as nicotine gum and the patch are used to stop smoking. These medicines gradually lower the dosage of nicotine in the body. These  medicines do not contain the carbon monoxide and other toxins found in tobacco smoke.  Hypnotherapy.  Relaxation therapy.  Nicotine Anonymous (a 12-step support program). Find times and locations in your local yellow pages. Document Released: 10/06/2003 Document Revised: 04/24/2011 Document Reviewed: 03/28/2013 [facility] Patient Information 2015 [place]. This information is not intended to replace advice given to you by your health care provider. Make sure you discuss any questions you have with your health care provider.

## 2014-05-01 NOTE — ED Notes (Signed)
C/o a constant HA onset 2 days Also reports a cough  Pt in room w/lights off; watching TV on his cell phone Alert, no signs of acute distress.

## 2014-05-01 NOTE — ED Provider Notes (Signed)
CSN: [id number]     Arrival date & time 05/01/14  1424 History   First MD Initiated Contact with Patient 05/01/14 1616     Chief Complaint  Patient presents with  . Headache   (Consider location/radiation/quality/duration/timing/severity/associated sxs/prior Treatment) HPI Comments: 34 year old male complaining of a headache for 2 days. It is primarily behind the eyes. Says this headache is similar to the ones he has had in the past. He has had 2 episodes of vomiting today. He denies having history of migraines and I do not see a diagnosis of migraines in his PMH. He denies problems with vision speech hearing or swallowing but notes that light hurts his eyes during the ocular exam. He denies abdominal pain, chest pain or shortness of breath. Denies focal paresthesias or motor weakness. He has been seen in the urgent care and emergency Department several times for headache and other injuries and ailments and has she had to follow-up with his PCP. He has a history of hypertension but depends on outpatient care to give him his antihypertensives.   Past Medical History  Diagnosis Date  . Hypertension   . Hypercholesteremia   . Stroke    Past Surgical History  Procedure Laterality Date  . Orthopedic surgery     No family history on file. History  Substance Use Topics  . Smoking status: Current Some Day Smoker    Types: Cigars  . Smokeless tobacco: Not on file  . Alcohol Use: Yes     Comment: socially    Review of Systems  Constitutional: Positive for activity change. Negative for fever and fatigue.  HENT: Positive for postnasal drip.   Respiratory: Positive for cough. Negative for chest tightness and shortness of breath.   Cardiovascular: Negative for chest pain, palpitations and leg swelling.  Gastrointestinal: Positive for nausea and vomiting.  Musculoskeletal: Negative.   Skin: Negative.   Neurological: Positive for headaches. Negative for tremors, seizures, syncope, facial  asymmetry, speech difficulty, light-headedness and numbness.  Psychiatric/Behavioral: Negative.     Allergies  Tea  Home Medications   Prior to Admission medications   Medication Sig Start Date End Date Taking? Authorizing Provider  hydrochlorothiazide (HYDRODIURIL) 25 MG tablet Take 1 tablet (25 mg total) by mouth daily. 12/30/13  Yes Charm RingsErin J Honig, MD  acetaminophen (TYLENOL) 325 MG tablet Take 650 mg by mouth every 6 (six) hours as needed. For pain relief    Historical Provider, MD  Chlorpheniramine-DM 2-15 MG/5ML LIQD 10 ml q 4 to 6 hours for cough and drainage. Will cause drowsiness. 05/01/14   Hayden Rasmussenavid Velvie Thomaston, NP  meloxicam (MOBIC) 15 MG tablet Take 1 tablet (15 mg total) by mouth daily. 12/30/13   Charm RingsErin J Honig, MD  Multiple Vitamin (MULITIVITAMIN WITH MINERALS) TABS Take 1 tablet by mouth daily.    Historical Provider, MD  naproxen sodium (ANAPROX) 220 MG tablet Take 220 mg by mouth 2 (two) times daily with a meal.    Historical Provider, MD  ondansetron (ZOFRAN) 4 MG tablet Take 1 tablet (4 mg total) by mouth every 6 (six) hours. 05/01/14   Hayden Rasmussenavid Costas Sena, NP  vitamin C (ASCORBIC ACID) 500 MG tablet Take 500 mg by mouth daily.    Historical Provider, MD   BP 153/108 mmHg  Pulse 86  Temp(Src) 98.3 F (36.8 C) (Oral)  Resp 20  SpO2 100% Physical Exam  Constitutional: He is oriented to person, place, and time. He appears well-developed and well-nourished. No distress.  HENT:  Head: Normocephalic and atraumatic.  Right Ear: External ear normal.  Left Ear: External ear normal.  Mouth/Throat: Oropharynx is clear and moist. No oropharyngeal exudate.  Eyes: Conjunctivae and EOM are normal. Pupils are equal, round, and reactive to light.  Neck: Normal range of motion. Neck supple.  Cardiovascular: Normal rate, regular rhythm, normal heart sounds and intact distal pulses.   Pulmonary/Chest: Effort normal and breath sounds normal. No respiratory distress. He has no wheezes. He has no rales.   Abdominal: Soft. There is no tenderness.  Musculoskeletal: He exhibits no edema.  Lymphadenopathy:    He has no cervical adenopathy.  Neurological: He is alert and oriented to person, place, and time. He has normal strength. He displays no tremor. No cranial nerve deficit or sensory deficit. He exhibits normal muscle tone. Coordination and gait normal.  Skin: Skin is warm and dry. He is not diaphoretic.  Nursing note and vitals reviewed.   ED Course  Procedures (including critical care time) Labs Review Labs Reviewed - No data to display  Imaging Review No results found.   MDM   1. Headache behind the eyes   2. Cough   3. PND (post-nasal drip)   4. Cigarette smoker   5. Essential hypertension   6. Obesity      Discussed with patient that he needs to obtain a local PCP. He has risk factors for heart disease, stroke,, kidney disease and blood clots. He needs to have a full physical and blood workup. His medications need to be reassessed and managed for abnormal findings. His blood pressure is not been well managed and that he has not seen a PCP and a year or 2. He is made aware that the Urgent Care cannot provide the essential care needed for his risk factors and evaluation. There are no neurological findings during this visit. We will treat with Decadron 10 mg IM, Toradol 60 mg IM and Zofran 4 mg by mouth. He is requesting a prescription for his cough. He has drainage. We will give a prescription for combination chlorpheniramine and dextromethorphan liquid. He is aware that he needs to go to social services to obtain an assigned physician for his Medicaid.    Hayden Rasmussen, NP 05/01/14 [phone]

## 2014-06-11 ENCOUNTER — Emergency Department (HOSPITAL_COMMUNITY)
Admission: EM | Admit: 2014-06-11 | Discharge: 2014-06-11 | Disposition: A | Discharge status: Home / Self Care | Payer: Medicaid Other | Source: Home / Self Care

## 2014-06-11 NOTE — ED Notes (Signed)
Reports pain in center chest, reports chronic cough.  Pain in center chest is aggravated by breathing, moving, coughing.  Patient reports inability to take a deeper breath because of pain that is caused by breathing  .  No sob, no diaphoresis, nad

## 2014-06-12 ENCOUNTER — Encounter (HOSPITAL_COMMUNITY): Payer: Self-pay | Admitting: Neurology

## 2014-06-12 ENCOUNTER — Emergency Department (INDEPENDENT_AMBULATORY_CARE_PROVIDER_SITE_OTHER)
Admission: EM | Admit: 2014-06-12 | Discharge: 2014-06-12 | Disposition: A | Discharge status: Other Acute Inpatient Hospital | Payer: Medicaid Other | Source: Home / Self Care | Attending: Family Medicine | Admitting: Family Medicine

## 2014-06-12 ENCOUNTER — Encounter (HOSPITAL_COMMUNITY): Payer: Self-pay | Admitting: *Deleted

## 2014-06-12 ENCOUNTER — Emergency Department (HOSPITAL_COMMUNITY): Payer: Medicaid Other

## 2014-06-12 ENCOUNTER — Emergency Department (HOSPITAL_COMMUNITY)
Admission: EM | Admit: 2014-06-12 | Discharge: 2014-06-12 | Disposition: A | Discharge status: Home / Self Care | Payer: Medicaid Other | Attending: Emergency Medicine | Admitting: Emergency Medicine

## 2014-06-12 DIAGNOSIS — Z720 Tobacco use: Secondary | ICD-10-CM | POA: Diagnosis not present

## 2014-06-12 DIAGNOSIS — I10 Essential (primary) hypertension: Secondary | ICD-10-CM | POA: Insufficient documentation

## 2014-06-12 DIAGNOSIS — R059 Cough, unspecified: Secondary | ICD-10-CM

## 2014-06-12 DIAGNOSIS — R0789 Other chest pain: Secondary | ICD-10-CM | POA: Diagnosis not present

## 2014-06-12 DIAGNOSIS — Z8639 Personal history of other endocrine, nutritional and metabolic disease: Secondary | ICD-10-CM | POA: Insufficient documentation

## 2014-06-12 DIAGNOSIS — R0781 Pleurodynia: Secondary | ICD-10-CM | POA: Diagnosis not present

## 2014-06-12 DIAGNOSIS — R05 Cough: Secondary | ICD-10-CM | POA: Insufficient documentation

## 2014-06-12 DIAGNOSIS — Z8673 Personal history of transient ischemic attack (TIA), and cerebral infarction without residual deficits: Secondary | ICD-10-CM | POA: Diagnosis not present

## 2014-06-12 DIAGNOSIS — R079 Chest pain, unspecified: Secondary | ICD-10-CM | POA: Diagnosis present

## 2014-06-12 LAB — CBC
HCT: 43.1 % (ref 39.0–52.0)
HEMOGLOBIN: 14.6 g/dL (ref 13.0–17.0)
MCH: 29.3 pg (ref 26.0–34.0)
MCHC: 33.9 g/dL (ref 30.0–36.0)
MCV: 86.4 fL (ref 78.0–100.0)
Platelets: 231 10*3/uL (ref 150–400)
RBC: 4.99 MIL/uL (ref 4.22–5.81)
RDW: 13.8 % (ref 11.5–15.5)
WBC: 9.9 10*3/uL (ref 4.0–10.5)

## 2014-06-12 LAB — BASIC METABOLIC PANEL
Anion gap: 7 (ref 5–15)
BUN: 5 mg/dL — ABNORMAL LOW (ref 6–23)
CO2: 25 mmol/L (ref 19–32)
Calcium: 8.7 mg/dL (ref 8.4–10.5)
Chloride: 108 mmol/L (ref 96–112)
Creatinine, Ser: 1.07 mg/dL (ref 0.50–1.35)
GFR calc Af Amer: >90 mL/min (ref >90)
GFR, EST NON AFRICAN AMERICAN: 88 mL/min — AB (ref >90)
Glucose, Bld: 115 mg/dL — ABNORMAL HIGH (ref 70–99)
Potassium: 3.9 mmol/L (ref 3.5–5.1)
Sodium: 140 mmol/L (ref 135–145)

## 2014-06-12 LAB — I-STAT TROPONIN, ED: TROPONIN I, POC: 0 ng/mL (ref 0.00–0.08)

## 2014-06-12 MED ORDER — ASPIRIN 81 MG PO CHEW
324.0000 mg | CHEWABLE_TABLET | Freq: Once | ORAL | Status: AC
  Administered 2014-06-12: 324 mg via ORAL

## 2014-06-12 MED ORDER — OLOPATADINE HCL 0.6 % NA SOLN: NASAL | Status: DC

## 2014-06-12 MED ORDER — HYDROCHLOROTHIAZIDE 25 MG PO TABS: 25.0000 mg | ORAL_TABLET | Freq: Every day | ORAL | Status: DC

## 2014-06-12 MED ORDER — BENZONATATE 100 MG PO CAPS: 100.0000 mg | ORAL_CAPSULE | Freq: Three times a day (TID) | ORAL | Status: DC

## 2014-06-12 MED ORDER — ASPIRIN 81 MG PO CHEW
CHEWABLE_TABLET | ORAL | Status: AC
  Filled 2014-06-12: qty 4

## 2014-06-12 NOTE — ED Notes (Signed)
Pt reports  Chest  Pain    Worse    When  He takes  A  Deep breath  And  Or  Coughs             He    Reports  The symptoms  X    2  Days      Pt  Reports     He  Has  Trouble sleeping  At  Night  From     The  Cough    [facility]  And  [employer]  [facility]

## 2014-06-12 NOTE — ED Notes (Addendum)
[facility] days that is central and stays in the same place. Pain is worse when he takes a deep breath. Has been having cough, denies sob, denies n/v. Pt is a x 4. [facility]

## 2014-06-12 NOTE — ED Provider Notes (Signed)
CSN: [id number]     Arrival date & time 06/12/14  1053 History   First MD Initiated Contact with Patient 06/12/14 1147     Chief Complaint  Patient presents with  . Chest Pain     (Consider location/radiation/quality/duration/timing/severity/associated sxs/prior Treatment) Patient is a 34 y.o. male presenting with chest pain. The history is provided by the patient. No language interpreter was used.  Chest Pain Pain location:  R chest Pain quality: aching   Pain radiates to:  Does not radiate Pain radiates to the back: no   Pain severity:  Moderate Onset quality:  Gradual Duration:  2 days Timing:  Constant Progression:  Worsening Chronicity:  New Context: movement and raising an arm   Relieved by:  Nothing Worsened by:  Nothing tried Ineffective treatments:  None tried Risk factors: hypertension and male sex     Past Medical History  Diagnosis Date  . Hypertension   . Hypercholesteremia   . Stroke    Past Surgical History  Procedure Laterality Date  . Orthopedic surgery     No family history on file. History  Substance Use Topics  . Smoking status: Light Tobacco Smoker    Types: Cigars  . Smokeless tobacco: Not on file  . Alcohol Use: Yes     Comment: socially    Review of Systems  Cardiovascular: Positive for chest pain.  All other systems reviewed and are negative.     Allergies  Tea  Home Medications   Prior to Admission medications   Medication Sig Start Date End Date Taking? Authorizing Provider  Chlorpheniramine-DM 2-15 MG/5ML LIQD 10 ml q 4 to 6 hours for cough and drainage. Will cause drowsiness. Patient not taking: Reported on 06/12/2014 05/01/14   Hayden Rasmussenavid Mabe, NP  hydrochlorothiazide (HYDRODIURIL) 25 MG tablet Take 1 tablet (25 mg total) by mouth daily. Patient not taking: Reported on 06/12/2014 06/12/14   Graylon GoodZachary H Baker, PA-C  meloxicam (MOBIC) 15 MG tablet Take 1 tablet (15 mg total) by mouth daily. Patient not taking: Reported on 06/12/2014  12/30/13   Charm RingsErin J Honig, MD  Olopatadine HCl 0.6 % SOLN 2 sprays/nostril BID Patient not taking: Reported on 06/12/2014 06/12/14   Graylon GoodZachary H Baker, PA-C  ondansetron (ZOFRAN) 4 MG tablet Take 1 tablet (4 mg total) by mouth every 6 (six) hours. Patient not taking: Reported on 06/12/2014 05/01/14   Hayden Rasmussenavid Mabe, NP   BP 155/104 mmHg  Pulse 74  Temp(Src) 98.4 F (36.9 C) (Oral)  Resp 18  Wt 345 lb 5 oz (156.633 kg)  SpO2 99% Physical Exam  Constitutional: He is oriented to person, place, and time. He appears well-developed and well-nourished.  HENT:  Head: Normocephalic and atraumatic.  Right Ear: External ear normal.  Left Ear: External ear normal.  Nose: Nose normal.  Mouth/Throat: Oropharynx is clear and moist.  Eyes: EOM are normal. Pupils are equal, round, and reactive to light.  Neck: Normal range of motion.  Cardiovascular: Normal rate and normal heart sounds.   Pulmonary/Chest: Effort normal.  Abdominal: Soft. He exhibits no distension.  Musculoskeletal: Normal range of motion.  Neurological: He is alert and oriented to person, place, and time.  Skin: Skin is warm.  Psychiatric: He has a normal mood and affect.  Nursing note and vitals reviewed.   ED Course  Procedures (including critical care time) Labs Review Labs Reviewed  BASIC METABOLIC PANEL - Abnormal; Notable for the following:    Glucose, Bld 115 (*)    BUN 5 (*)  GFR calc non Af Amer 88 (*)    All other components within normal limits  CBC  I-STAT TROPOININ, ED    Imaging Review Dg Chest 2 View  06/12/2014   CLINICAL DATA:  Chest pain, worse when taking a breath or coughing. Symptoms for 2 days.  EXAM: CHEST  2 VIEW  COMPARISON:  02/04/2014  FINDINGS: Heart and mediastinal contours are within normal limits. No focal opacities or effusions. No acute bony abnormality. No visible rib fracture or pneumothorax.  IMPRESSION: No active cardiopulmonary disease.   Electronically Signed   By: Charlett Nose M.D.   On:  06/12/2014 12:10     EKG Interpretation   Date/Time:  Friday June 12 2014 10:58:58 EDT Ventricular Rate:  66 PR Interval:  156 QRS Duration: 72 QT Interval:  376 QTC Calculation: 394 R Axis:   56 Text Interpretation:  Normal sinus rhythm with sinus arrhythmia  Nonspecific T wave abnormality Abnormal ECG since last tracing no  significant change Confirmed by WENTZ  MD, ELLIOTT ([id number]) on 06/12/2014  12:57:53 PM      MDM  Pt reports he has a chronic cough that he thinks caused his chest pain.   Pt reports he has been taking phenergan with codeine for years but his MD has stopped presribing it.    EKG is unchanged,  Troponin is negative,   I doubt cardiac etiology as cause of pain.   Final diagnoses:  Cough  Chest wall pain        Elson Areas, PA-C 06/12/14 1503  Mancel Bale, MD 06/12/14 1620

## 2014-06-12 NOTE — Discharge Instructions (Signed)

## 2014-06-12 NOTE — ED Provider Notes (Signed)
CSN: [id number]     Arrival date & time 06/12/14  [id number] History   First MD Initiated Contact with Patient 06/12/14 [phone]     Chief Complaint  Patient presents with  . Chest Pain   (Consider location/radiation/quality/duration/timing/severity/associated sxs/prior Treatment) HPI        34 year old male presents for evaluation of substernal chest pain that started 2 days ago. He describes having pain in his chest takes a deep breath or when he coughs the pain is sharp and stabbing. It is nonradiating, nonexertional. He has no shortness of breath, NVD, or diaphoresis. His history of hypertension, no history of coronary artery disease. No diabetes. He occasionally smokes cigars. His hypertension is untreated, he ran out of his medication. He has been coughing a lot at night because of his allergies  Past Medical History  Diagnosis Date  . Hypertension   . Hypercholesteremia   . Stroke    Past Surgical History  Procedure Laterality Date  . Orthopedic surgery     History reviewed. No pertinent family history. History  Substance Use Topics  . Smoking status: Current Some Day Smoker    Types: Cigars  . Smokeless tobacco: Not on file  . Alcohol Use: Yes     Comment: socially    Review of Systems  Respiratory: Positive for cough.   Cardiovascular: Positive for chest pain (pleuritic).  All other systems reviewed and are negative.   Allergies  Tea  Home Medications   Prior to Admission medications   Medication Sig Start Date End Date Taking? Authorizing Provider  acetaminophen (TYLENOL) 325 MG tablet Take 650 mg by mouth every 6 (six) hours as needed. For pain relief    Historical Provider, MD  Chlorpheniramine-DM 2-15 MG/5ML LIQD 10 ml q 4 to 6 hours for cough and drainage. Will cause drowsiness. 05/01/14   Hayden Rasmussenavid Mabe, NP  hydrochlorothiazide (HYDRODIURIL) 25 MG tablet Take 1 tablet (25 mg total) by mouth daily. 06/12/14   Graylon GoodZachary H Raahil Ong, PA-C  meloxicam (MOBIC) 15 MG tablet Take 1  tablet (15 mg total) by mouth daily. 12/30/13   Charm RingsErin J Honig, MD  Multiple Vitamin (MULITIVITAMIN WITH MINERALS) TABS Take 1 tablet by mouth daily.    Historical Provider, MD  naproxen sodium (ANAPROX) 220 MG tablet Take 220 mg by mouth 2 (two) times daily with a meal.    Historical Provider, MD  Olopatadine HCl 0.6 % SOLN 2 sprays/nostril BID 06/12/14   Graylon GoodZachary H Bryen Hinderman, PA-C  ondansetron (ZOFRAN) 4 MG tablet Take 1 tablet (4 mg total) by mouth every 6 (six) hours. 05/01/14   Hayden Rasmussenavid Mabe, NP  vitamin C (ASCORBIC ACID) 500 MG tablet Take 500 mg by mouth daily.    Historical Provider, MD   BP 174/103 mmHg  Pulse 74  Temp(Src) 98 F (36.7 C) (Oral)  Resp 18  SpO2 96% Physical Exam  Constitutional: He is oriented to person, place, and time. He appears well-developed and well-nourished. No distress.  Obese habitus  HENT:  Head: Normocephalic.  Cardiovascular: Normal rate, regular rhythm and normal heart sounds.   Pulmonary/Chest: Effort normal. No respiratory distress. He has decreased breath sounds (globally, decreased effort).  Neurological: He is alert and oriented to person, place, and time. Coordination normal.  Skin: Skin is warm and dry. No rash noted. He is not diaphoretic.  Psychiatric: He has a normal mood and affect. Judgment normal.  Nursing note and vitals reviewed.   ED Course  ED EKG  Date/Time: 06/12/2014 9:51 AM Performed  by: Vang Kraeger H Authorized by: Autumn Messing H Comparison: compared with previous ECG  Comparison to previous ECG: New inferior lateral T-wave inversions Rhythm: sinus rhythm Rate: normal QRS axis: normal Conduction: conduction normal ST Segments: ST segments normal T depression: III, aVF, V4, V5 and V6 T flattening: II Other: no other findings Clinical impression: abnormal ECG Comments: EKG independently reviewed by me as above   (including critical care time) Labs Review Labs Reviewed - No data to display  Imaging Review No results  found.   MDM   1. Pleuritic chest pain    He has inferior lateral T-wave inversions, these were not present on his last EKG. His EKG prior to that that show some T-wave inversions in the lateral leads. It appears that the only definite new finding is the flattening of the T-wave in lead 2 and the inversion in aVF. He needs further evaluation in the emergency department, needs troponins, also rule out PE. He is being transferred to the emergency department. He was given 324 mg of aspirin here prior to transfer    Graylon Good, PA-C 06/12/14 [phone]

## 2014-07-13 ENCOUNTER — Emergency Department (HOSPITAL_COMMUNITY): Payer: Medicaid Other

## 2014-07-13 ENCOUNTER — Emergency Department (HOSPITAL_COMMUNITY)
Admission: EM | Admit: 2014-07-13 | Discharge: 2014-07-13 | Disposition: A | Discharge status: Home / Self Care | Payer: Medicaid Other | Attending: Emergency Medicine | Admitting: Emergency Medicine

## 2014-07-13 ENCOUNTER — Encounter (HOSPITAL_COMMUNITY): Payer: Self-pay

## 2014-07-13 DIAGNOSIS — R109 Unspecified abdominal pain: Secondary | ICD-10-CM | POA: Insufficient documentation

## 2014-07-13 DIAGNOSIS — R112 Nausea with vomiting, unspecified: Secondary | ICD-10-CM | POA: Diagnosis not present

## 2014-07-13 DIAGNOSIS — H53149 Visual discomfort, unspecified: Secondary | ICD-10-CM | POA: Insufficient documentation

## 2014-07-13 DIAGNOSIS — R079 Chest pain, unspecified: Secondary | ICD-10-CM | POA: Diagnosis not present

## 2014-07-13 DIAGNOSIS — R42 Dizziness and giddiness: Secondary | ICD-10-CM | POA: Insufficient documentation

## 2014-07-13 DIAGNOSIS — R6883 Chills (without fever): Secondary | ICD-10-CM | POA: Insufficient documentation

## 2014-07-13 DIAGNOSIS — R197 Diarrhea, unspecified: Secondary | ICD-10-CM | POA: Insufficient documentation

## 2014-07-13 DIAGNOSIS — R51 Headache: Secondary | ICD-10-CM | POA: Diagnosis not present

## 2014-07-13 DIAGNOSIS — Z8673 Personal history of transient ischemic attack (TIA), and cerebral infarction without residual deficits: Secondary | ICD-10-CM | POA: Diagnosis not present

## 2014-07-13 DIAGNOSIS — Z8639 Personal history of other endocrine, nutritional and metabolic disease: Secondary | ICD-10-CM | POA: Insufficient documentation

## 2014-07-13 DIAGNOSIS — R630 Anorexia: Secondary | ICD-10-CM | POA: Diagnosis not present

## 2014-07-13 DIAGNOSIS — R55 Syncope and collapse: Secondary | ICD-10-CM | POA: Insufficient documentation

## 2014-07-13 DIAGNOSIS — R519 Headache, unspecified: Secondary | ICD-10-CM

## 2014-07-13 DIAGNOSIS — Z720 Tobacco use: Secondary | ICD-10-CM | POA: Insufficient documentation

## 2014-07-13 DIAGNOSIS — I10 Essential (primary) hypertension: Secondary | ICD-10-CM | POA: Insufficient documentation

## 2014-07-13 LAB — COMPREHENSIVE METABOLIC PANEL
ALBUMIN: 4.1 g/dL (ref 3.5–5.0)
ALK PHOS: 64 U/L (ref 38–126)
ALT: 28 U/L (ref 17–63)
ANION GAP: 11 (ref 5–15)
AST: 31 U/L (ref 15–41)
BUN: 10 mg/dL (ref 6–20)
CALCIUM: 9.3 mg/dL (ref 8.9–10.3)
CO2: 29 mmol/L (ref 22–32)
Chloride: 99 mmol/L — ABNORMAL LOW (ref 101–111)
Creatinine, Ser: 1.07 mg/dL (ref 0.61–1.24)
GFR calc Af Amer: >60 mL/min (ref >60)
GFR calc non Af Amer: >60 mL/min (ref >60)
Glucose, Bld: 108 mg/dL — ABNORMAL HIGH (ref 65–99)
POTASSIUM: 3 mmol/L — AB (ref 3.5–5.1)
Sodium: 139 mmol/L (ref 135–145)
TOTAL PROTEIN: 7.4 g/dL (ref 6.5–8.1)
Total Bilirubin: 0.8 mg/dL (ref 0.3–1.2)

## 2014-07-13 LAB — CBC
HCT: 44.4 % (ref 39.0–52.0)
Hemoglobin: 15.4 g/dL (ref 13.0–17.0)
MCH: 29.4 pg (ref 26.0–34.0)
MCHC: 34.7 g/dL (ref 30.0–36.0)
MCV: 84.7 fL (ref 78.0–100.0)
Platelets: 267 10*3/uL (ref 150–400)
RBC: 5.24 MIL/uL (ref 4.22–5.81)
RDW: 13.3 % (ref 11.5–15.5)
WBC: 11.1 10*3/uL — ABNORMAL HIGH (ref 4.0–10.5)

## 2014-07-13 LAB — I-STAT TROPONIN, ED: TROPONIN I, POC: 0.01 ng/mL (ref 0.00–0.08)

## 2014-07-13 LAB — LIPASE, BLOOD: Lipase: 19 U/L — ABNORMAL LOW (ref 22–51)

## 2014-07-13 MED ORDER — AMMONIA AROMATIC IN INHA
RESPIRATORY_TRACT | Status: AC
  Filled 2014-07-13: qty 10

## 2014-07-13 MED ORDER — POTASSIUM CHLORIDE CRYS ER 20 MEQ PO TBCR
20.0000 meq | EXTENDED_RELEASE_TABLET | Freq: Once | ORAL | Status: AC
  Administered 2014-07-13: 20 meq via ORAL
  Filled 2014-07-13: qty 1

## 2014-07-13 MED ORDER — DIPHENHYDRAMINE HCL 50 MG/ML IJ SOLN
25.0000 mg | Freq: Once | INTRAMUSCULAR | Status: AC
  Administered 2014-07-13: 25 mg via INTRAVENOUS
  Filled 2014-07-13: qty 1

## 2014-07-13 MED ORDER — METOCLOPRAMIDE HCL 5 MG/ML IJ SOLN
10.0000 mg | Freq: Once | INTRAMUSCULAR | Status: AC
  Administered 2014-07-13: 10 mg via INTRAVENOUS
  Filled 2014-07-13: qty 2

## 2014-07-13 MED ORDER — MORPHINE SULFATE 4 MG/ML IJ SOLN
4.0000 mg | Freq: Once | INTRAMUSCULAR | Status: AC
  Administered 2014-07-13: 4 mg via INTRAVENOUS
  Filled 2014-07-13: qty 1

## 2014-07-13 MED ORDER — ONDANSETRON HCL 4 MG/2ML IJ SOLN
4.0000 mg | Freq: Once | INTRAMUSCULAR | Status: AC
  Administered 2014-07-13: 4 mg via INTRAVENOUS
  Filled 2014-07-13: qty 2

## 2014-07-13 NOTE — ED Notes (Signed)
As this RN was completing Pt's Triage, Pt asked if he could go to the bathroom.  Denisa NT helped Pt out of the Triage chair.  As she turned to open the door, the Pt's knees buckled and he fell into the floor.  It is noted that the Pt was not incontinent of urine.  Pt would not respond to voice, but quickly jerked awake w/ ammonia inhalant.  Pt asked what had happened and stated that his face was sore.  MD made aware.

## 2014-07-13 NOTE — ED Notes (Signed)
Pt up to restroom w/o difficulty.

## 2014-07-13 NOTE — ED Provider Notes (Signed)
CSN: [id number]     Arrival date & time 07/13/14  1520 History   First MD Initiated Contact with Patient 07/13/14 1606     Chief Complaint  Patient presents with  . Dizziness  . Chest Pain   [name] [name] is a 34 y.o. male with a history of a TIA, hypertension hyperlipidemia who presents to the emergency department complaining of generalized abdominal pain, nausea, vomiting, diarrhea and headache starting today. Patient reports this morning he woke up with generalized abdominal pain and had one episode of diarrhea which improved his abdominal pain. He reports that this afternoon he began feeling room spinning dizziness and a headache with associated photophobia. He also reports having left-sided substernal chest pain that is nonradiating beginning about 45 minutes prior to arrival to the emergency department. At the time of my evaluation he denies any chest pain or shortness of breath. Patient reports he vomited twice today and is still having some nausea. He complains of a 10 out of 10 headache that is generalized to his head. He complains of room spinning dizziness. He reports after arriving to the emergency department he stood up and passed out in triage. At the time of my evaluation the patient denies room spinning dizziness but only complains of headache. The patient reports a previous history of a TIA 6 years ago. He denies similar symptoms today. He reports taking his hydrochlorothiazide daily as prescribed. The patient denies fevers, chills, hematemesis, cough, hemoptysis, hematochezia, changes to his vision, numbness, tingling, weakness, recent long travel, recent surgeries, shortness of breath, or history of MI. The patient denies personal or close family history of MI. The patient denies personal or close family history of DVTs or PEs. The patient denies personal or close family history of blood clotting disorders such as factor V Leiden, protein C or S deficiency.  (Consider  location/radiation/quality/duration/timing/severity/associated sxs/prior Treatment) HPI  Past Medical History  Diagnosis Date  . Hypertension   . Hypercholesteremia   . Stroke    Past Surgical History  Procedure Laterality Date  . Orthopedic surgery    . Orif tibia & fibula fractures    . Orif radius & ulna fractures     No family history on file. History  Substance Use Topics  . Smoking status: Light Tobacco Smoker    Types: Cigars  . Smokeless tobacco: Not on file  . Alcohol Use: Yes     Comment: socially    Review of Systems  Constitutional: Positive for chills and appetite change. Negative for fever.  HENT: Negative for congestion, ear pain and sore throat.   Eyes: Positive for photophobia. Negative for pain and visual disturbance.  Respiratory: Negative for cough, shortness of breath and wheezing.   Cardiovascular: Positive for chest pain. Negative for palpitations and leg swelling.  Gastrointestinal: Positive for nausea, vomiting, abdominal pain and diarrhea. Negative for blood in stool.  Genitourinary: Negative for dysuria, frequency, flank pain and difficulty urinating.  Musculoskeletal: Negative for back pain and neck pain.  Skin: Negative for rash.  Neurological: Positive for dizziness, syncope and headaches. Negative for weakness, light-headedness and numbness.      Allergies  Tea  Home Medications   Prior to Admission medications   Medication Sig Start Date End Date Taking? Authorizing Provider  hydrochlorothiazide (HYDRODIURIL) 25 MG tablet Take 1 tablet (25 mg total) by mouth daily. 06/12/14  Yes Adrian Blackwater Baker, PA-C  benzonatate (TESSALON) 100 MG capsule Take 1 capsule (100 mg total) by mouth every 8 (eight)  hours. Patient not taking: Reported on 07/13/2014 06/12/14   Elson Areas, PA-C  Chlorpheniramine-DM 2-15 MG/5ML LIQD 10 ml q 4 to 6 hours for cough and drainage. Will cause drowsiness. Patient not taking: Reported on 06/12/2014 05/01/14   Hayden Rasmussen, NP  meloxicam (MOBIC) 15 MG tablet Take 1 tablet (15 mg total) by mouth daily. Patient not taking: Reported on 06/12/2014 12/30/13   Charm Rings, MD  Olopatadine HCl 0.6 % SOLN 2 sprays/nostril BID Patient not taking: Reported on 06/12/2014 06/12/14   Graylon Good, PA-C  ondansetron (ZOFRAN) 4 MG tablet Take 1 tablet (4 mg total) by mouth every 6 (six) hours. Patient not taking: Reported on 06/12/2014 05/01/14   Hayden Rasmussen, NP   BP 158/91 mmHg  Pulse 77  Temp(Src) 98.3 F (36.8 C) (Oral)  Resp 18  SpO2 100% Physical Exam  Constitutional: He is oriented to person, place, and time. He appears well-developed and well-nourished. No distress.  Nontoxic appearing.  HENT:  Head: Normocephalic and atraumatic.  Right Ear: External ear normal.  Left Ear: External ear normal.  Nose: Nose normal.  Mouth/Throat: Oropharynx is clear and moist. No oropharyngeal exudate.  Bilateral tympanic membranes are pearly-gray without erythema or loss of landmarks. No temporal edema.   Eyes: Conjunctivae and EOM are normal. Pupils are equal, round, and reactive to light. Right eye exhibits no discharge. Left eye exhibits no discharge.  Neck: Normal range of motion. Neck supple. No JVD present. No tracheal deviation present.  Cardiovascular: Normal rate, regular rhythm, normal heart sounds and intact distal pulses.  Exam reveals no gallop and no friction rub.   No murmur heard. Bilateral radial, posterior tibialis and dorsalis pedis pulses are intact.    Pulmonary/Chest: Effort normal and breath sounds normal. No respiratory distress. He has no wheezes. He has no rales.  Lungs are clear to auscultation bilaterally.  Abdominal: Soft. Bowel sounds are normal. He exhibits no distension and no mass. There is no tenderness. There is no rebound and no guarding.  Abdomen is soft and nontender to palpation. Bowel sounds are present.  Musculoskeletal: He exhibits no edema or tenderness.  No lower extremity edema  or tenderness.  Lymphadenopathy:    He has no cervical adenopathy.  Neurological: He is alert and oriented to person, place, and time. No cranial nerve deficit. Coordination normal.  Cranial nerves are intact bilaterally. EOMs intact without nystagmus. No pronator drift. Finger to nose intact bilaterally.  Sensation intact his bilateral upper and lower extremities. The patient is able to ambulate without difficulty or assistance.  Skin: Skin is warm and dry. No rash noted. He is not diaphoretic. No erythema. No pallor.  Psychiatric: He has a normal mood and affect. His behavior is normal.  Nursing note and vitals reviewed.   ED Course  Procedures (including critical care time) Labs Review Labs Reviewed  CBC - Abnormal; Notable for the following:    WBC 11.1 (*)    All other components within normal limits  COMPREHENSIVE METABOLIC PANEL - Abnormal; Notable for the following:    Potassium 3.0 (*)    Chloride 99 (*)    Glucose, Bld 108 (*)    All other components within normal limits  LIPASE, BLOOD - Abnormal; Notable for the following:    Lipase 19 (*)    All other components within normal limits  I-STAT TROPOININ, ED    Imaging Review Ct Head Wo Contrast  07/13/2014   CLINICAL DATA:  Dizziness, syncope  EXAM: CT HEAD WITHOUT CONTRAST  TECHNIQUE: Contiguous axial images were obtained from the base of the skull through the vertex without intravenous contrast.  COMPARISON:  02/04/2014  FINDINGS: No evidence of parenchymal hemorrhage or extra-axial fluid collection. No mass lesion, mass effect, or midline shift.  No CT evidence of acute infarction.  Cerebral volume is within normal limits.  No ventriculomegaly.  The visualized paranasal sinuses are essentially clear. The mastoid air cells are unopacified.  No evidence of calvarial fracture.  IMPRESSION: Normal head CT.   Electronically Signed   By: Charline Bills M.D.   On: 07/13/2014 18:25   Dg Chest Port 1 View  07/13/2014   CLINICAL  DATA:  Dizziness and chest pain today.  EXAM: PORTABLE CHEST - 1 VIEW  COMPARISON:  06/12/2014  FINDINGS: Lungs are hypoinflated with mild prominence of the central bronchovascular markings. No focal consolidation or effusion. Cardiomediastinal silhouette and remainder of the exam is unchanged.  IMPRESSION: Mild prominence of the central bronchovascular markings which may be due to minimal vascular congestion or acute/chronic bronchitic process.   Electronically Signed   By: Elberta Fortis M.D.   On: 07/13/2014 16:17     EKG Interpretation None      Filed Vitals:   07/13/14 1722 07/13/14 1730 07/13/14 1946 07/13/14 2032  BP: 178/99 167/99 158/91 158/91  Pulse: 76  79 77  Temp:      TempSrc:      Resp: 15  18   SpO2: 99%  100% 100%     MDM   Meds given in ED:  Medications  ondansetron (ZOFRAN) injection 4 mg (4 mg Intravenous Given 07/13/14 1701)  morphine 4 MG/ML injection 4 mg (4 mg Intravenous Given 07/13/14 1701)  metoCLOPramide (REGLAN) injection 10 mg (10 mg Intravenous Given 07/13/14 1822)  diphenhydrAMINE (BENADRYL) injection 25 mg (25 mg Intravenous Given 07/13/14 1822)  potassium chloride SA (K-DUR,KLOR-CON) CR tablet 20 mEq (20 mEq Oral Given 07/13/14 2028)    Discharge Medication List as of 07/13/2014  8:10 PM      Final diagnoses:  Nausea vomiting and diarrhea  Chest pain, unspecified chest pain type  Bad headache   This is a 34 y.o. male with a history of a TIA, hypertension hyperlipidemia who presents to the emergency department complaining of generalized abdominal pain, nausea, vomiting, diarrhea and headache starting today. Patient reports this morning he woke up with generalized abdominal pain and had one episode of diarrhea which improved his abdominal pain. He reports that this afternoon he began feeling room spinning dizziness and a headache with associated photophobia. He also reports having left-sided substernal chest pain that is nonradiating beginning about 45  minutes prior to arrival to the emergency department. At the time of my evaluation he denies any chest pain or shortness of breath. Patient reports he vomited twice today and is still having some nausea. He complains of a 10 out of 10 headache that is generalized to his head. He reportedly fell while standing up to use the bathroom after triage. At my evaluation he denies dizziness but complains of generalized headache. Patient also denies chest pain currently. Patient is afebrile and nontoxic appearing. He has no focal neurological deficits. His abdomen is soft and nontender to palpation. Patient has a negative troponin. His CBC shows a white count of 11.1 but is otherwise unremarkable. His CMP shows a potassium of 3.0 and is otherwise unremarkable. He has a lipase of 19. CT head is unremarkable. Chest x-ray shows possible  acute/chronic bronchitis process. The patient denies cough, wheezing or shortness of breath.  At reevaluation the patient reports complete resolution of all his symptoms. He reports headache is completely resolved. The patient has eaten a burger and fries that was brought to him by a friend. He reports tolerating this well and has no nausea or vomiting. The patient reports being ready for discharge. The patient declines any prescriptions for home. I advised the patient to follow-up with their primary care provider this week for further evaluation of his blood pressure and to recheck his potassium. I advised the patient to return to the emergency department with new or worsening symptoms or new concerns. The patient verbalized understanding and agreement with plan.    This patient was discussed with Dr. Rubin PayorPickering who agrees with assessment and plan.     Everlene FarrierWilliam Shelia Kingsberry, PA-C 07/14/14 [id number]  Benjiman CoreNathan Pickering, MD 07/15/14 [phone]

## 2014-07-13 NOTE — Progress Notes (Signed)
Pt noted to open his ED room door. Noted sitting on edge of stretcher vomiting clear liquids Pt stated "I'm just getting up water. I'm shaking and my head hurts" Pt denies eating anything out of the ordinary.  CM assist pt with comfort measures to include cool cloth, kleenex and emesis bag.  Cm discussed with pt importance of use of emesis bag so nursing can get accurate amount and contents emesis ED RN updated

## 2014-07-13 NOTE — Discharge Instructions (Signed)
Nausea and Vomiting Nausea is a sick feeling that often comes before throwing up (vomiting). Vomiting is a reflex where stomach contents come out of your mouth. Vomiting can cause severe loss of body fluids (dehydration). Children and elderly adults can become dehydrated quickly, especially if they also have diarrhea. Nausea and vomiting are symptoms of a condition or disease. It is important to find the cause of your symptoms. CAUSES   Direct irritation of the stomach lining. This irritation can result from increased acid production (gastroesophageal reflux disease), infection, food poisoning, taking certain medicines (such as nonsteroidal anti-inflammatory drugs), alcohol use, or tobacco use.  Signals from the brain.These signals could be caused by a headache, heat exposure, an inner ear disturbance, increased pressure in the brain from injury, infection, a tumor, or a concussion, pain, emotional stimulus, or metabolic problems.  An obstruction in the gastrointestinal tract (bowel obstruction).  Illnesses such as diabetes, hepatitis, gallbladder problems, appendicitis, kidney problems, cancer, sepsis, atypical symptoms of a heart attack, or eating disorders.  Medical treatments such as chemotherapy and radiation.  Receiving medicine that makes you sleep (general anesthetic) during surgery. DIAGNOSIS Your caregiver may ask for tests to be done if the problems do not improve after a few days. Tests may also be done if symptoms are severe or if the reason for the nausea and vomiting is not clear. Tests may include:  Urine tests.  Blood tests.  Stool tests.  Cultures (to look for evidence of infection).  X-rays or other imaging studies. Test results can help your caregiver make decisions about treatment or the need for additional tests. TREATMENT You need to stay well hydrated. Drink frequently but in small amounts.You may wish to drink water, sports drinks, clear broth, or eat frozen  ice pops or gelatin dessert to help stay hydrated.When you eat, eating slowly may help prevent nausea.There are also some antinausea medicines that may help prevent nausea. HOME CARE INSTRUCTIONS   Take all medicine as directed by your caregiver.  If you do not have an appetite, do not force yourself to eat. However, you must continue to drink fluids.  If you have an appetite, eat a normal diet unless your caregiver tells you differently.  Eat a variety of complex carbohydrates (rice, wheat, potatoes, bread), lean meats, yogurt, fruits, and vegetables.  Avoid high-fat foods because they are more difficult to digest.  Drink enough water and fluids to keep your urine clear or pale yellow.  If you are dehydrated, ask your caregiver for specific rehydration instructions. Signs of dehydration may include:  Severe thirst.  Dry lips and mouth.  Dizziness.  Dark urine.  Decreasing urine frequency and amount.  Confusion.  Rapid breathing or pulse. SEEK IMMEDIATE MEDICAL CARE IF:   You have blood or brown flecks (like coffee grounds) in your vomit.  You have black or bloody stools.  You have a severe headache or stiff neck.  You are confused.  You have severe abdominal pain.  You have chest pain or trouble breathing.  You do not urinate at least once every 8 hours.  You develop cold or clammy skin.  You continue to vomit for longer than 24 to 48 hours.  You have a fever. MAKE SURE YOU:   Understand these instructions.  Will watch your condition.  Will get help right away if you are not doing well or get worse. Document Released: 01/30/2005 Document Revised: 04/24/2011 Document Reviewed: 06/29/2010 [employer] Patient Information 2015 [employer]. This information is not intended   to replace advice given to you by your health care provider. Make sure you discuss any questions you have with your health care provider. Dizziness Dizziness is a common problem. It is a  feeling of unsteadiness or light-headedness. You may feel like you are about to faint. Dizziness can lead to injury if you stumble or fall. A person of any age group can suffer from dizziness, but dizziness is more common in older adults. CAUSES  Dizziness can be caused by many different things, including:  Middle ear problems.  Standing for too long.  Infections.  An allergic reaction.  Aging.  An emotional response to something, such as the sight of blood.  Side effects of medicines.  Tiredness.  Problems with circulation or blood pressure.  Excessive use of alcohol or medicines, or illegal drug use.  Breathing too fast (hyperventilation).  An irregular heart rhythm (arrhythmia).  A low red blood cell count (anemia).  Pregnancy.  Vomiting, diarrhea, fever, or other illnesses that cause body fluid loss (dehydration).  Diseases or conditions such as Parkinson's disease, high blood pressure (hypertension), diabetes, and thyroid problems.  Exposure to extreme heat. DIAGNOSIS  Your health care provider will ask about your symptoms, perform a physical exam, and perform an electrocardiogram (ECG) to record the electrical activity of your heart. Your health care provider may also perform other heart or blood tests to determine the cause of your dizziness. These may include:  Transthoracic echocardiogram (TTE). During echocardiography, sound waves are used to evaluate how blood flows through your heart.  Transesophageal echocardiogram (TEE).  Cardiac monitoring. This allows your health care provider to monitor your heart rate and rhythm in real time.  Holter monitor. This is a portable device that records your heartbeat and can help diagnose heart arrhythmias. It allows your health care provider to track your heart activity for several days if needed.  Stress tests by exercise or by giving medicine that makes the heart beat faster. TREATMENT  Treatment of dizziness depends  on the cause of your symptoms and can vary greatly. HOME CARE INSTRUCTIONS   Drink enough fluids to keep your urine clear or pale yellow. This is especially important in very hot weather. In older adults, it is also important in cold weather.  Take your medicine exactly as directed if your dizziness is caused by medicines. When taking blood pressure medicines, it is especially important to get up slowly.  Rise slowly from chairs and steady yourself until you feel okay.  In the morning, first sit up on the side of the bed. When you feel okay, stand slowly while holding onto something until you know your balance is fine.  Move your legs often if you need to stand in one place for a long time. Tighten and relax your muscles in your legs while standing.  Have someone stay with you for 1-2 days if dizziness continues to be a problem. Do this until you feel you are well enough to stay alone. Have the person call your health care provider if he or she notices changes in you that are concerning.  Do not drive or use heavy machinery if you feel dizzy.  Do not drink alcohol. SEEK IMMEDIATE MEDICAL CARE IF:   Your dizziness or light-headedness gets worse.  You feel nauseous or vomit.  You have problems talking, walking, or using your arms, hands, or legs.  You feel weak.  You are not thinking clearly or you have trouble forming sentences. It may take a friend  or family member to notice this.  You have chest pain, abdominal pain, shortness of breath, or sweating.  Your vision changes.  You notice any bleeding.  You have side effects from medicine that seems to be getting worse rather than better. MAKE SURE YOU:   Understand these instructions.  Will watch your condition.  Will get help right away if you are not doing well or get worse. Document Released: 07/26/2000 Document Revised: 02/04/2013 Document Reviewed: 08/19/2010 [facility] Patient Information 2015 [place]. This  information is not intended to replace advice given to you by your health care provider. Make sure you discuss any questions you have with your health care provider. Chest Pain (Nonspecific) It is often hard to give a specific diagnosis for the cause of chest pain. There is always a chance that your pain could be related to something serious, such as a heart attack or a blood clot in the lungs. You need to follow up with your health care provider for further evaluation. CAUSES   Heartburn.  Pneumonia or bronchitis.  Anxiety or stress.  Inflammation around your heart (pericarditis) or lung (pleuritis or pleurisy).  A blood clot in the lung.  A collapsed lung (pneumothorax). It can develop suddenly on its own (spontaneous pneumothorax) or from trauma to the chest.  Shingles infection (herpes zoster virus). The chest wall is composed of bones, muscles, and cartilage. Any of these can be the source of the pain.  The bones can be bruised by injury.  The muscles or cartilage can be strained by coughing or overwork.  The cartilage can be affected by inflammation and become sore (costochondritis). DIAGNOSIS  Lab tests or other studies may be needed to find the cause of your pain. Your health care provider may have you take a test called an ambulatory electrocardiogram (ECG). An ECG records your heartbeat patterns over a 24-hour period. You may also have other tests, such as:  Transthoracic echocardiogram (TTE). During echocardiography, sound waves are used to evaluate how blood flows through your heart.  Transesophageal echocardiogram (TEE).  Cardiac monitoring. This allows your health care provider to monitor your heart rate and rhythm in real time.  Holter monitor. This is a portable device that records your heartbeat and can help diagnose heart arrhythmias. It allows your health care provider to track your heart activity for several days, if needed.  Stress tests by exercise or by  giving medicine that makes the heart beat faster. TREATMENT   Treatment depends on what may be causing your chest pain. Treatment may include:  Acid blockers for heartburn.  Anti-inflammatory medicine.  Pain medicine for inflammatory conditions.  Antibiotics if an infection is present.  You may be advised to change lifestyle habits. This includes stopping smoking and avoiding alcohol, caffeine, and chocolate.  You may be advised to keep your head raised (elevated) when sleeping. This reduces the chance of acid going backward from your stomach into your esophagus. Most of the time, nonspecific chest pain will improve within 2-3 days with rest and mild pain medicine.  HOME CARE INSTRUCTIONS   If antibiotics were prescribed, take them as directed. Finish them even if you start to feel better.  For the next few days, avoid physical activities that bring on chest pain. Continue physical activities as directed.  Do not use any tobacco products, including cigarettes, chewing tobacco, or electronic cigarettes.  Avoid drinking alcohol.  Only take medicine as directed by your health care provider.  Follow your health care provider's  suggestions for further testing if your chest pain does not go away.  Keep any follow-up appointments you made. If you do not go to an appointment, you could develop lasting (chronic) problems with pain. If there is any problem keeping an appointment, call to reschedule. SEEK MEDICAL CARE IF:   Your chest pain does not go away, even after treatment.  You have a rash with blisters on your chest.  You have a fever. SEEK IMMEDIATE MEDICAL CARE IF:   You have increased chest pain or pain that spreads to your arm, neck, jaw, back, or abdomen.  You have shortness of breath.  You have an increasing cough, or you cough up blood.  You have severe back or abdominal pain.  You feel nauseous or vomit.  You have severe weakness.  You faint.  You have  chills. This is an emergency. Do not wait to see if the pain will go away. Get medical help at once. Call your local emergency services (911 in [place] Do not drive yourself to the hospital. MAKE SURE YOU:   Understand these instructions.  Will watch your condition.  Will get help right away if you are not doing well or get worse. Document Released: 11/09/2004 Document Revised: 02/04/2013 Document Reviewed: 09/05/2007 [facility] Patient Information 2015 [place], [place]. This information is not intended to replace advice given to you by your health care provider. Make sure you discuss any questions you have with your health care provider. General Headache Without Cause A headache is pain or discomfort felt around the head or neck area. The specific cause of a headache may not be found. There are many causes and types of headaches. A few common ones are:  Tension headaches.  Migraine headaches.  Cluster headaches.  Chronic daily headaches. HOME CARE INSTRUCTIONS   Keep all follow-up appointments with your caregiver or any specialist referral.  Only take over-the-counter or prescription medicines for pain or discomfort as directed by your caregiver.  Lie down in a dark, quiet room when you have a headache.  Keep a headache journal to find out what may trigger your migraine headaches. For example, write down:  What you eat and drink.  How much sleep you get.  Any change to your diet or medicines.  Try massage or other relaxation techniques.  Put ice packs or heat on the head and neck. Use these 3 to 4 times per day for 15 to 20 minutes each time, or as needed.  Limit stress.  Sit up straight, and do not tense your muscles.  Quit smoking if you smoke.  Limit alcohol use.  Decrease the amount of caffeine you drink, or stop drinking caffeine.  Eat and sleep on a regular schedule.  Get 7 to 9 hours of sleep, or as recommended by your caregiver.  Keep lights dim if bright  lights bother you and make your headaches worse. SEEK MEDICAL CARE IF:   You have problems with the medicines you were prescribed.  Your medicines are not working.  You have a change from the usual headache.  You have nausea or vomiting. SEEK IMMEDIATE MEDICAL CARE IF:   Your headache becomes severe.  You have a fever.  You have a stiff neck.  You have loss of vision.  You have muscular weakness or loss of muscle control.  You start losing your balance or have trouble walking.  You feel faint or pass out.  You have severe symptoms that are different from your first symptoms. MAKE  SURE YOU:   Understand these instructions.  Will watch your condition.  Will get help right away if you are not doing well or get worse. Document Released: 01/30/2005 Document Revised: 04/24/2011 Document Reviewed: 02/15/2011 [facility] Patient Information 2015 [place], [place]. This information is not intended to replace advice given to you by your health care provider. Make sure you discuss any questions you have with your health care provider.

## 2014-07-13 NOTE — ED Notes (Signed)
Pt c/o central chest pain, abdominal pain, and n/v/d starting this morning and dizziness starting this afternoon.  Pain score 8/10.  Pt reports that nothing makes pain better or worse.  Hx of smoking and HTN.

## 2014-09-22 ENCOUNTER — Emergency Department (INDEPENDENT_AMBULATORY_CARE_PROVIDER_SITE_OTHER)
Admission: EM | Admit: 2014-09-22 | Discharge: 2014-09-22 | Disposition: A | Discharge status: Home / Self Care | Payer: Medicaid Other | Source: Home / Self Care | Attending: Emergency Medicine | Admitting: Emergency Medicine

## 2014-09-22 ENCOUNTER — Encounter (HOSPITAL_COMMUNITY): Payer: Self-pay | Admitting: Emergency Medicine

## 2014-09-22 DIAGNOSIS — M546 Pain in thoracic spine: Secondary | ICD-10-CM | POA: Diagnosis not present

## 2014-09-22 MED ORDER — HYDROCODONE-ACETAMINOPHEN 5-325 MG PO TABS
1.0000 | ORAL_TABLET | Freq: Once | ORAL | Status: AC
  Administered 2014-09-22: 1 via ORAL

## 2014-09-22 MED ORDER — METHYLPREDNISOLONE ACETATE 80 MG/ML IJ SUSP
INTRAMUSCULAR | Status: AC
  Filled 2014-09-22: qty 1

## 2014-09-22 MED ORDER — CYCLOBENZAPRINE HCL 10 MG PO TABS: 10.0000 mg | ORAL_TABLET | Freq: Three times a day (TID) | ORAL | Status: DC | PRN

## 2014-09-22 MED ORDER — IBUPROFEN 800 MG PO TABS: 800.0000 mg | ORAL_TABLET | Freq: Three times a day (TID) | ORAL | Status: DC | PRN

## 2014-09-22 MED ORDER — HYDROCODONE-ACETAMINOPHEN 5-325 MG PO TABS: 1.0000 | ORAL_TABLET | Freq: Four times a day (QID) | ORAL | Status: DC | PRN

## 2014-09-22 MED ORDER — HYDROCODONE-ACETAMINOPHEN 5-325 MG PO TABS
ORAL_TABLET | ORAL | Status: AC
  Filled 2014-09-22: qty 1

## 2014-09-22 MED ORDER — METHYLPREDNISOLONE ACETATE 80 MG/ML IJ SUSP
80.0000 mg | Freq: Once | INTRAMUSCULAR | Status: AC
  Administered 2014-09-22: 80 mg via INTRAMUSCULAR

## 2014-09-22 NOTE — ED Notes (Signed)
C/o back pain onset 0800 today... Denies inj/trauma States he has been running recently Pain increases w/activity Alert... No acute distress.

## 2014-09-22 NOTE — Discharge Instructions (Signed)
You have strained a muscle in your back. I'm not sure how this happened. Take ibuprofen  3 times a day for the next several days. Take Flexeril every 8 hours as needed for spasms. This medicine may make you very drowsy. Use the Vicodin every 4-6 hours as needed for severe pain. Apply heat to the area at least 3-4 times a day. Follow-up as needed.

## 2014-09-22 NOTE — ED Provider Notes (Signed)
CSN: [id number]     Arrival date & time 09/22/14  1442 History   First MD Initiated Contact with Patient 09/22/14 1721     Chief Complaint  Patient presents with  . Back Pain   (Consider location/radiation/quality/duration/timing/severity/associated sxs/prior Treatment) HPI  He is a 34 year old man here for evaluation of back pain. He states it started suddenly this morning. It is located in his right thoracic back. It is worse with any sort of movement. No radiating pain. He denies any injury or trauma. He states he does run, but denies any recent increase in mileage or falls. No heavy lifting. No new activities. He has been taking ibuprofen every few hours today without improvement.  Past Medical History  Diagnosis Date  . Hypertension   . Hypercholesteremia   . Stroke    Past Surgical History  Procedure Laterality Date  . Orthopedic surgery    . Orif tibia & fibula fractures    . Orif radius & ulna fractures     No family history on file. History  Substance Use Topics  . Smoking status: Light Tobacco Smoker    Types: Cigars  . Smokeless tobacco: Not on file  . Alcohol Use: Yes     Comment: socially    Review of Systems As in history of present illness Allergies  Tea  Home Medications   Prior to Admission medications   Medication Sig Start Date End Date Taking? Authorizing Provider  hydrochlorothiazide (HYDRODIURIL) 25 MG tablet Take 1 tablet (25 mg total) by mouth daily. 06/12/14  Yes Graylon Good, PA-C  cyclobenzaprine (FLEXERIL) 10 MG tablet Take 1 tablet (10 mg total) by mouth 3 (three) times daily as needed for muscle spasms. 09/22/14   Charm Rings, MD  HYDROcodone-acetaminophen (NORCO) 5-325 MG per tablet Take 1 tablet by mouth every 6 (six) hours as needed for severe pain. 09/22/14   Charm Rings, MD  ibuprofen (ADVIL,MOTRIN) 800 MG tablet Take 1 tablet (800 mg total) by mouth every 8 (eight) hours as needed for moderate pain. 09/22/14   Charm Rings, MD   BP  119/85 mmHg  Pulse 59  Temp(Src) 98.2 F (36.8 C) (Oral)  Resp 18  SpO2 99% Physical Exam  Constitutional: He is oriented to person, place, and time. He appears well-developed and well-nourished. No distress.  Cardiovascular: Normal rate.   Pulmonary/Chest: Effort normal.  Musculoskeletal:  Back: No erythema or edema. No vertebral tenderness or step-offs. He has mild spasm and is tender to palpation over the right thoracic paraspinous muscles.  Neurological: He is alert and oriented to person, place, and time.    ED Course  Procedures (including critical care time) Labs Review Labs Reviewed - No data to display  Imaging Review No results found.   MDM   1. Right-sided thoracic back pain    Treat with heat, regular ibuprofen, Flexeril as needed, Norco as needed. Norco 5-325 milligrams given here for pain.  Depo-Medrol 80 mg IM given. Follow-up as needed.    Charm Rings, MD 09/22/14 [phone]

## 2014-12-18 ENCOUNTER — Encounter (HOSPITAL_COMMUNITY): Payer: Self-pay | Admitting: Emergency Medicine

## 2014-12-18 ENCOUNTER — Emergency Department (INDEPENDENT_AMBULATORY_CARE_PROVIDER_SITE_OTHER): Payer: Medicaid Other

## 2014-12-18 ENCOUNTER — Emergency Department (HOSPITAL_COMMUNITY)
Admission: EM | Admit: 2014-12-18 | Discharge: 2014-12-18 | Disposition: A | Discharge status: Home / Self Care | Payer: Medicaid Other | Source: Home / Self Care | Attending: Family Medicine | Admitting: Family Medicine

## 2014-12-18 DIAGNOSIS — S63501A Unspecified sprain of right wrist, initial encounter: Secondary | ICD-10-CM | POA: Diagnosis not present

## 2014-12-18 MED ORDER — KETOROLAC TROMETHAMINE 60 MG/2ML IM SOLN
INTRAMUSCULAR | Status: AC
  Filled 2014-12-18: qty 2

## 2014-12-18 MED ORDER — NAPROXEN 375 MG PO TABS: 375.0000 mg | ORAL_TABLET | Freq: Two times a day (BID) | ORAL | Status: DC

## 2014-12-18 MED ORDER — HYDROCHLOROTHIAZIDE 25 MG PO TABS
25.0000 mg | ORAL_TABLET | Freq: Every day | ORAL | Status: DC
Start: 2014-12-18 — End: 2015-03-04

## 2014-12-18 MED ORDER — KETOROLAC TROMETHAMINE 60 MG/2ML IM SOLN
60.0000 mg | Freq: Once | INTRAMUSCULAR | Status: AC
  Administered 2014-12-18: 60 mg via INTRAMUSCULAR
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
## 2014-12-18 NOTE — Discharge Instructions (Signed)
Generic Wrist Exercises  RANGE OF MOTION (ROM) AND STRETCHING EXERCISES - Wrist Sprain   These exercises may help you when beginning to rehabilitate your injury. Your symptoms may resolve with or without further involvement from your physician, physical therapist, or athletic trainer. While completing these exercises, remember:   · Restoring tissue flexibility helps normal motion to return to the joints. This allows healthier, less painful movement and activity.  · An effective stretch should be held for at least 30 seconds.  · A stretch should never be painful. You should only feel a gentle lengthening or release in the stretched tissue.  RANGE OF MOTION - Wrist Flexion, Active-Assisted  · Extend your right / left elbow with your palm pointing down.*  · Gently pull the back of your hand toward you until you feel a gentle stretch on the top of your forearm.  · Hold this position for __________ seconds.  Repeat __________ times. Complete this exercise __________ times per day.   *If directed by your physician, physical therapist, or athletic trainer, complete this stretch with your elbow bent rather than extended.  RANGE OF MOTION - Wrist Extension, Active-Assisted   · Extend your right / left elbow and turn your palm upward.*  · Gently pull your palm/fingertips back so your wrist extends and your fingers point more toward the ground.  · You should feel a gentle stretch on the inside of your forearm.  · Hold this position for __________ seconds.  Repeat __________ times. Complete this exercise __________ times per day.  *If directed by your physician, physical therapist, or athletic trainer, complete this stretch with your elbow bent, rather than extended.  RANGE OF MOTION - Supination, Active   · Stand or sit with your elbows at your side. Bend your right / left elbow to 90 degrees.  · Turn your palm upward until you feel a gentle stretch on the inside of your forearm.  · Hold this position for __________ seconds.  Slowly release and return to the starting position.  Repeat __________ times. Complete this stretch __________ times per day.   RANGE OF MOTION - Pronation, Active   · Stand or sit with your elbows at your side. Bend your right / left elbow to 90 degrees.  · Turn your palm downward until you feel a gentle stretch on the top of your forearm.  · Hold this position for __________ seconds. Slowly release and return to the starting position.  Repeat __________ times. Complete this stretch __________ times per day.   STRENGTHENING EXERCISES   These exercises may help you when beginning to rehabilitate your injury. They may resolve your symptoms with or without further involvement from your physician, physical therapist, or athletic trainer. While completing these exercises, remember:   · Muscles can gain both the endurance and the strength needed for everyday activities through controlled exercises.  · Complete these exercises as instructed by your physician, physical therapist, or athletic trainer. Progress the resistance and repetitions only as guided.  · You may experience muscle soreness or fatigue, but the pain or discomfort you are trying to eliminate should never worsen during these exercises. If this pain does worsen, stop and make certain you are following the directions exactly. If the pain is still present after adjustments, discontinue the exercise until you can discuss the trouble with your clinician.  STRENGTH - Wrist Flexors  · Sit with your right / left forearm palm-up and fully supported. Your elbow should be resting below the   height of your shoulder. Allow your wrist to extend over the edge of the surface.  · Loosely holding a __________ weight or a piece of rubber exercise band/tubing, slowly curl your hand up toward your forearm.  · Hold this position for __________ seconds. Slowly lower the wrist back to the starting position in a controlled manner.  Repeat __________ times. Complete this exercise  __________ times per day.   STRENGTH - Wrist Extensors  · Sit with your right / left forearm palm-down and fully supported. Your elbow should be resting below the height of your shoulder. Allow your wrist to extend over the edge of the surface.  · Loosely holding a __________ weight or a piece of rubber exercise band/tubing, slowly curl your hand up toward your forearm.  · Hold this position for __________ seconds. Slowly lower the wrist back to the starting position in a controlled manner.  Repeat __________ times. Complete this exercise __________ times per day.   STRENGTH - Forearm Supinators  · Sit with your right / left forearm supported on a table, keeping your elbow below shoulder height. Rest your hand over the edge, palm down.  · Gently grip a hammer or a soup ladle.  · Without moving your elbow, slowly turn your palm and hand upward to a "thumbs-up" position.  · Hold this position for __________ seconds. Slowly return to the starting position.  Repeat __________ times. Complete this exercise __________ times per day.   STRENGTH - Forearm Pronators   · Sit with your right / left forearm supported on a table, keeping your elbow below shoulder height. Rest your hand over the edge, palm up.  · Gently grip a hammer or a soup ladle.  · Without moving your elbow, slowly turn your palm and hand upward to a "thumbs-up" position.  · Hold this position for __________ seconds. Slowly return to the starting position.  Repeat __________ times. Complete this exercise __________ times per day.   STRENGTH - Grip  · Grasp a tennis ball, a dense sponge, or a large, rolled sock in your hand.  · Squeeze as hard as you can without increasing any pain.  · Hold this position for __________ seconds. Release your grip slowly.  Repeat __________ times. Complete this exercise __________ times per day.      This information is not intended to replace advice given to you by your health care provider. Make sure you discuss any questions  you have with your health care provider.     Document Released: 12/14/2004 Document Revised: 02/20/2014 Document Reviewed: 05/14/2008  Elsevier Interactive Patient Education ©2016 [employer]

## 2015-01-06 ENCOUNTER — Encounter (HOSPITAL_COMMUNITY): Payer: Self-pay | Admitting: Emergency Medicine

## 2015-01-06 ENCOUNTER — Emergency Department (INDEPENDENT_AMBULATORY_CARE_PROVIDER_SITE_OTHER)
Admission: EM | Admit: 2015-01-06 | Discharge: 2015-01-06 | Disposition: A | Discharge status: Home / Self Care | Payer: Medicaid Other | Source: Home / Self Care

## 2015-01-06 DIAGNOSIS — M5442 Lumbago with sciatica, left side: Secondary | ICD-10-CM

## 2015-01-06 MED ORDER — CYCLOBENZAPRINE HCL 5 MG PO TABS: 5.0000 mg | ORAL_TABLET | Freq: Three times a day (TID) | ORAL | Status: DC | PRN

## 2015-01-06 MED ORDER — METHYLPREDNISOLONE ACETATE 80 MG/ML IJ SUSP
INTRAMUSCULAR | Status: AC
  Filled 2015-01-06: qty 1

## 2015-01-06 MED ORDER — KETOROLAC TROMETHAMINE 60 MG/2ML IM SOLN
60.0000 mg | Freq: Once | INTRAMUSCULAR | Status: AC
  Administered 2015-01-06: 60 mg via INTRAMUSCULAR

## 2015-01-06 MED ORDER — METHYLPREDNISOLONE ACETATE 80 MG/ML IJ SUSP
80.0000 mg | Freq: Once | INTRAMUSCULAR | Status: AC
  Administered 2015-01-06: 80 mg via INTRAMUSCULAR

## 2015-01-06 MED ORDER — METHYLPREDNISOLONE 4 MG PO TBPK: ORAL_TABLET | ORAL | Status: DC

## 2015-01-06 MED ORDER — KETOROLAC TROMETHAMINE 60 MG/2ML IM SOLN
INTRAMUSCULAR | Status: AC
  Filled 2015-01-06: qty 2

## 2015-01-06 NOTE — ED Notes (Signed)
Lower back pain that started this morning.  Complains of left foot tingling and left lower leg going numb.  No known injury

## 2015-01-06 NOTE — ED Provider Notes (Signed)
CSN: [id number]     Arrival date & time 01/06/15  1454 History   None    Chief Complaint  Patient presents with  . Back Pain   (Consider location/radiation/quality/duration/timing/severity/associated sxs/prior Treatment) Patient is a 34 y.o. male presenting with back pain. The history is provided by the patient.  Back Pain Location:  Sacro-iliac joint Quality:  Stabbing Radiates to:  L thigh, L knee and L foot Pain severity:  Moderate Onset quality:  Gradual Progression:  Worsening Chronicity:  New (no prior back problems) Context: lifting heavy objects and twisting   Relieved by:  None tried Worsened by:  Nothing tried Ineffective treatments:  None tried Associated symptoms: leg pain, numbness and paresthesias   Associated symptoms: no abdominal pain, no abdominal swelling, no bladder incontinence, no bowel incontinence, no dysuria, no pelvic pain, no perianal numbness and no weakness   Risk factors: obesity     Past Medical History  Diagnosis Date  . Hypertension   . Hypercholesteremia   . Stroke [facility])    Past Surgical History  Procedure Laterality Date  . Orthopedic surgery    . Orif tibia & fibula fractures    . Orif radius & ulna fractures     No family history on file. Social History  Substance Use Topics  . Smoking status: Light Tobacco Smoker    Types: Cigars  . Smokeless tobacco: None  . Alcohol Use: Yes     Comment: socially    Review of Systems  Constitutional: Negative.   Gastrointestinal: Negative.  Negative for abdominal pain and bowel incontinence.  Genitourinary: Negative.  Negative for bladder incontinence, dysuria and pelvic pain.  Musculoskeletal: Positive for back pain. Negative for myalgias, joint swelling and gait problem.  Skin: Negative.   Neurological: Positive for numbness and paresthesias. Negative for weakness.  All other systems reviewed and are negative.   Allergies  Tea  Home Medications   Prior to Admission medications    Medication Sig Start Date End Date Taking? Authorizing Provider  cyclobenzaprine (FLEXERIL) 5 MG tablet Take 1 tablet (5 mg total) by mouth 3 (three) times daily as needed for muscle spasms. 01/06/15   Linna Hoff, MD  hydrochlorothiazide (HYDRODIURIL) 25 MG tablet Take 1 tablet (25 mg total) by mouth daily. 06/12/14   Graylon Good, PA-C  hydrochlorothiazide (HYDRODIURIL) 25 MG tablet Take 1 tablet (25 mg total) by mouth daily. 12/18/14   Deatra Canter, FNP  HYDROcodone-acetaminophen (NORCO) 5-325 MG per tablet Take 1 tablet by mouth every 6 (six) hours as needed for severe pain. 09/22/14   Charm Rings, MD  ibuprofen (ADVIL,MOTRIN) 800 MG tablet Take 1 tablet (800 mg total) by mouth every 8 (eight) hours as needed for moderate pain. 09/22/14   Charm Rings, MD  methylPREDNISolone (MEDROL DOSEPAK) 4 MG TBPK tablet follow package directions, take until finished, start on thurs 01/06/15   Linna Hoff, MD  naproxen (NAPROSYN) 375 MG tablet Take 1 tablet (375 mg total) by mouth 2 (two) times daily. 12/18/14   Deatra Canter, FNP   Meds Ordered and Administered this Visit   Medications  ketorolac (TORADOL) injection 60 mg (60 mg Intramuscular Given 01/06/15 1655)  methylPREDNISolone acetate (DEPO-MEDROL) injection 80 mg (80 mg Intramuscular Given 01/06/15 1655)    BP 141/103 mmHg  Pulse 68  Temp(Src) 98.1 F (36.7 C) (Oral)  Resp 22  SpO2 98% No data found.   Physical Exam  Constitutional: He is oriented to person, place, and  time. He appears well-developed and well-nourished. No distress.  Abdominal: Soft. Bowel sounds are normal.  Musculoskeletal: He exhibits tenderness.       Lumbar back: He exhibits tenderness, bony tenderness, pain and spasm. He exhibits normal pulse.  Neurological: He is alert and oriented to person, place, and time.  Skin: Skin is warm and dry.  Nursing note and vitals reviewed.   ED Course  Procedures (including critical care time)  Labs Review Labs  Reviewed - No data to display  Imaging Review No results found.   Visual Acuity Review  Right Eye Distance:   Left Eye Distance:   Bilateral Distance:    Right Eye Near:   Left Eye Near:    Bilateral Near:         MDM   1. Left-sided low back pain with left-sided sciatica        Linna HoffJames D Braulio Kiedrowski, MD 01/06/15 1711

## 2015-03-04 ENCOUNTER — Encounter (HOSPITAL_COMMUNITY): Payer: Self-pay | Admitting: *Deleted

## 2015-03-04 ENCOUNTER — Emergency Department (INDEPENDENT_AMBULATORY_CARE_PROVIDER_SITE_OTHER)
Admission: EM | Admit: 2015-03-04 | Discharge: 2015-03-04 | Disposition: A | Discharge status: Home / Self Care | Payer: Medicaid Other | Source: Home / Self Care | Attending: Family Medicine | Admitting: Family Medicine

## 2015-03-04 DIAGNOSIS — J069 Acute upper respiratory infection, unspecified: Secondary | ICD-10-CM | POA: Diagnosis not present

## 2015-03-04 DIAGNOSIS — I10 Essential (primary) hypertension: Secondary | ICD-10-CM | POA: Diagnosis not present

## 2015-03-04 MED ORDER — HYDROCHLOROTHIAZIDE 25 MG PO TABS: 25.0000 mg | ORAL_TABLET | Freq: Every day | ORAL | Status: DC

## 2015-03-04 MED ORDER — IPRATROPIUM BROMIDE 0.06 % NA SOLN: 2.0000 | Freq: Four times a day (QID) | NASAL | Status: DC

## 2015-03-04 NOTE — ED Provider Notes (Signed)
CSN: [id number]     Arrival date & time 03/04/15  1755 History   First MD Initiated Contact with Patient 03/04/15 1826     Chief Complaint  Patient presents with  . URI   (Consider location/radiation/quality/duration/timing/severity/associated sxs/prior Treatment) Patient is a 35 y.o. male presenting with URI. The history is provided by the patient.  URI Presenting symptoms: congestion and rhinorrhea   Presenting symptoms: no cough and no fever   Severity:  Moderate Onset quality:  Gradual Duration:  2 days Progression:  Unchanged Chronicity:  New Relieved by:  None tried Worsened by:  Nothing tried Ineffective treatments:  None tried Associated symptoms: sinus pain   Associated symptoms: no wheezing     Past Medical History  Diagnosis Date  . Hypertension   . Hypercholesteremia   . Stroke [facility])    Past Surgical History  Procedure Laterality Date  . Orthopedic surgery    . Orif tibia & fibula fractures    . Orif radius & ulna fractures     History reviewed. No pertinent family history. Social History  Substance Use Topics  . Smoking status: Light Tobacco Smoker    Types: Cigars  . Smokeless tobacco: None  . Alcohol Use: Yes     Comment: socially    Review of Systems  Constitutional: Negative.  Negative for fever.  HENT: Positive for congestion, postnasal drip, rhinorrhea and sinus pressure. Negative for facial swelling.   Respiratory: Negative for cough and wheezing.   Cardiovascular: Negative for chest pain.  Skin: Negative.   All other systems reviewed and are negative.   Allergies  Tea  Home Medications   Prior to Admission medications   Medication Sig Start Date End Date Taking? Authorizing Provider  cyclobenzaprine (FLEXERIL) 5 MG tablet Take 1 tablet (5 mg total) by mouth 3 (three) times daily as needed for muscle spasms. 01/06/15   Linna Hoff, MD  hydrochlorothiazide (HYDRODIURIL) 25 MG tablet Take 1 tablet (25 mg total) by mouth daily. 06/12/14    Graylon Good, PA-C  hydrochlorothiazide (HYDRODIURIL) 25 MG tablet Take 1 tablet (25 mg total) by mouth daily. 12/18/14   Deatra Canter, FNP  HYDROcodone-acetaminophen (NORCO) 5-325 MG per tablet Take 1 tablet by mouth every 6 (six) hours as needed for severe pain. 09/22/14   Charm Rings, MD  ibuprofen (ADVIL,MOTRIN) 800 MG tablet Take 1 tablet (800 mg total) by mouth every 8 (eight) hours as needed for moderate pain. 09/22/14   Charm Rings, MD  methylPREDNISolone (MEDROL DOSEPAK) 4 MG TBPK tablet follow package directions, take until finished, start on thurs 01/06/15   Linna Hoff, MD  naproxen (NAPROSYN) 375 MG tablet Take 1 tablet (375 mg total) by mouth 2 (two) times daily. 12/18/14   Deatra Canter, FNP   Meds Ordered and Administered this Visit  Medications - No data to display  BP 153/90 mmHg  Pulse 80  Temp(Src) 98.5 F (36.9 C) (Oral)  Resp 16  SpO2 97% No data found.   Physical Exam  Constitutional: He is oriented to person, place, and time. He appears well-developed and well-nourished. No distress.  HENT:  Right Ear: External ear normal.  Left Ear: External ear normal.  Nose: Mucosal edema and rhinorrhea present.  Mouth/Throat: Oropharynx is clear and moist.  Eyes: Pupils are equal, round, and reactive to light.  Neck: Normal range of motion. Neck supple.  Cardiovascular: Normal heart sounds and intact distal pulses.   Pulmonary/Chest: Effort normal and breath  sounds normal.  Lymphadenopathy:    He has no cervical adenopathy.  Neurological: He is alert and oriented to person, place, and time.  Skin: Skin is warm and dry.  Nursing note and vitals reviewed.   ED Course  Procedures (including critical care time)  Labs Review Labs Reviewed - No data to display  Imaging Review No results found.   Visual Acuity Review  Right Eye Distance:   Left Eye Distance:   Bilateral Distance:    Right Eye Near:   Left Eye Near:    Bilateral Near:          MDM  No diagnosis found. Meds ordered this encounter  Medications  . ipratropium (ATROVENT) 0.06 % nasal spray    Sig: Place 2 sprays into both nostrils 4 (four) times daily.    Dispense:  15 mL    Refill:  1  . hydrochlorothiazide (HYDRODIURIL) 25 MG tablet    Sig: Take 1 tablet (25 mg total) by mouth daily.    Dispense:  30 tablet    Refill:  0       Linna Hoff, MD 03/04/15 1859

## 2015-03-04 NOTE — Discharge Instructions (Signed)
Drink plenty of water, see your doctor for further refills or adjustment of bp care.

## 2015-03-04 NOTE — ED Notes (Signed)
Pt  Reports  Symptoms  Of  Sinus  Congestion   /  Drainage       Sinus    Stuffy  And  Congested      With  Drainage    With  Symptoms  Since  Yesterday

## 2015-05-27 ENCOUNTER — Emergency Department (HOSPITAL_COMMUNITY)
Admission: EM | Admit: 2015-05-27 | Discharge: 2015-05-27 | Disposition: A | Discharge status: Home / Self Care | Payer: Medicaid Other | Attending: Emergency Medicine | Admitting: Emergency Medicine

## 2015-05-27 ENCOUNTER — Emergency Department (HOSPITAL_COMMUNITY): Payer: Medicaid Other

## 2015-05-27 ENCOUNTER — Encounter (HOSPITAL_COMMUNITY): Payer: Self-pay | Admitting: *Deleted

## 2015-05-27 DIAGNOSIS — I10 Essential (primary) hypertension: Secondary | ICD-10-CM | POA: Insufficient documentation

## 2015-05-27 DIAGNOSIS — F17210 Nicotine dependence, cigarettes, uncomplicated: Secondary | ICD-10-CM | POA: Insufficient documentation

## 2015-05-27 DIAGNOSIS — R079 Chest pain, unspecified: Secondary | ICD-10-CM

## 2015-05-27 DIAGNOSIS — Z79899 Other long term (current) drug therapy: Secondary | ICD-10-CM | POA: Insufficient documentation

## 2015-05-27 DIAGNOSIS — Z8673 Personal history of transient ischemic attack (TIA), and cerebral infarction without residual deficits: Secondary | ICD-10-CM | POA: Insufficient documentation

## 2015-05-27 DIAGNOSIS — R600 Localized edema: Secondary | ICD-10-CM | POA: Insufficient documentation

## 2015-05-27 DIAGNOSIS — G44039 Episodic paroxysmal hemicrania, not intractable: Secondary | ICD-10-CM | POA: Insufficient documentation

## 2015-05-27 DIAGNOSIS — E7800 Pure hypercholesterolemia, unspecified: Secondary | ICD-10-CM | POA: Insufficient documentation

## 2015-05-27 LAB — LIPASE, BLOOD: LIPASE: 79 U/L — AB (ref 11–51)

## 2015-05-27 LAB — BASIC METABOLIC PANEL
Anion gap: 8 (ref 5–15)
BUN: 8 mg/dL (ref 6–20)
CALCIUM: 9.2 mg/dL (ref 8.9–10.3)
CHLORIDE: 104 mmol/L (ref 101–111)
CO2: 29 mmol/L (ref 22–32)
Creatinine, Ser: 1.14 mg/dL (ref 0.61–1.24)
Glucose, Bld: 107 mg/dL — ABNORMAL HIGH (ref 65–99)
Potassium: 4 mmol/L (ref 3.5–5.1)
Sodium: 141 mmol/L (ref 135–145)

## 2015-05-27 LAB — I-STAT TROPONIN, ED
TROPONIN I, POC: 0.01 ng/mL (ref 0.00–0.08)
TROPONIN I, POC: 0.01 ng/mL (ref 0.00–0.08)

## 2015-05-27 LAB — CBC
HCT: 49.2 % (ref 39.0–52.0)
Hemoglobin: 16 g/dL (ref 13.0–17.0)
MCH: 28.1 pg (ref 26.0–34.0)
MCHC: 32.5 g/dL (ref 30.0–36.0)
MCV: 86.3 fL (ref 78.0–100.0)
PLATELETS: 240 10*3/uL (ref 150–400)
RBC: 5.7 MIL/uL (ref 4.22–5.81)
RDW: 13.4 % (ref 11.5–15.5)
WBC: 8.6 10*3/uL (ref 4.0–10.5)

## 2015-05-27 LAB — TROPONIN I

## 2015-05-27 LAB — D-DIMER, QUANTITATIVE: D-Dimer, Quant: <0.27 ug/mL-FEU (ref 0.00–0.50)

## 2015-05-27 MED ORDER — DIPHENHYDRAMINE HCL 50 MG/ML IJ SOLN
25.0000 mg | Freq: Once | INTRAMUSCULAR | Status: AC
  Administered 2015-05-27: 25 mg via INTRAVENOUS
  Filled 2015-05-27: qty 1

## 2015-05-27 MED ORDER — HYDROCHLOROTHIAZIDE 25 MG PO TABS: 25.0000 mg | ORAL_TABLET | Freq: Every day | ORAL | Status: DC

## 2015-05-27 MED ORDER — OXYCODONE-ACETAMINOPHEN 5-325 MG PO TABS
1.0000 | ORAL_TABLET | ORAL | Status: DC | PRN
  Administered 2015-05-27: 1 via ORAL
  Filled 2015-05-27: qty 1

## 2015-05-27 MED ORDER — HYDROCHLOROTHIAZIDE 25 MG PO TABS
25.0000 mg | ORAL_TABLET | Freq: Once | ORAL | Status: AC
  Administered 2015-05-27: 25 mg via ORAL
  Filled 2015-05-27: qty 1

## 2015-05-27 MED ORDER — IBUPROFEN 400 MG PO TABS: 400.0000 mg | ORAL_TABLET | Freq: Four times a day (QID) | ORAL | Status: DC | PRN

## 2015-05-27 MED ORDER — METOCLOPRAMIDE HCL 5 MG/ML IJ SOLN
10.0000 mg | Freq: Once | INTRAMUSCULAR | Status: AC
  Administered 2015-05-27: 10 mg via INTRAVENOUS
  Filled 2015-05-27: qty 2

## 2015-05-27 MED ORDER — KETOROLAC TROMETHAMINE 30 MG/ML IJ SOLN
30.0000 mg | Freq: Once | INTRAMUSCULAR | Status: AC
  Administered 2015-05-27: 30 mg via INTRAVENOUS
  Filled 2015-05-27: qty 1

## 2015-05-27 NOTE — ED Notes (Signed)
The pt woke up a[pprox one hour ago  From sleep with a headache and central chest pain .Marland Kitchen. Chest pain worse with movement and inspiration  No previous history

## 2015-05-27 NOTE — ED Provider Notes (Signed)
CSN: [id number]     Arrival date & time 05/27/15  0422 History   First MD Initiated Contact with Patient 05/27/15 [phone]     Chief Complaint  Patient presents with  . Chest Pain     (Consider location/radiation/quality/duration/timing/severity/associated sxs/prior Treatment) HPI Comments: Patient presents with chest pain. He states he woke up about an hour prior to arrival with pain to the left side of his chest. He also has an associated headache. He describes the pain as a sharp tight feeling. On the left side of his chest. It's nonradiating. It's worse with movement and worse with deep breathing. He denies any shortness of breath other than it hurts when he breathes. He denies any cough or congestion. No fevers or chills. He denies any leg pain or swelling. He did recently travel to [place] within the last 3 weeks. He denies nausea or vomiting. No diaphoresis. No history of heart problems. No family history of heart disease. He does have hypertension. He denies any hyperlipidemia diabetes or other past medical problems.  Patient is a 35 y.o. male presenting with chest pain.  Chest Pain Associated symptoms: headache   Associated symptoms: no abdominal pain, no back pain, no cough, no diaphoresis, no dizziness, no fatigue, no fever, no nausea, no numbness, no shortness of breath, not vomiting and no weakness     Past Medical History  Diagnosis Date  . Hypertension   . Hypercholesteremia   . Stroke [facility])    Past Surgical History  Procedure Laterality Date  . Orthopedic surgery    . Orif tibia & fibula fractures    . Orif radius & ulna fractures     No family history on file. Social History  Substance Use Topics  . Smoking status: Light Tobacco Smoker    Types: Cigars  . Smokeless tobacco: None  . Alcohol Use: Yes     Comment: socially    Review of Systems  Constitutional: Negative for fever, chills, diaphoresis and fatigue.  HENT: Negative for congestion, rhinorrhea and  sneezing.   Eyes: Negative.   Respiratory: Negative for cough, chest tightness and shortness of breath.   Cardiovascular: Positive for chest pain. Negative for leg swelling.  Gastrointestinal: Negative for nausea, vomiting, abdominal pain, diarrhea and blood in stool.  Genitourinary: Negative for frequency, hematuria, flank pain and difficulty urinating.  Musculoskeletal: Negative for back pain and arthralgias.  Skin: Negative for rash.  Neurological: Positive for headaches. Negative for dizziness, speech difficulty, weakness and numbness.      Allergies  Tea  Home Medications   Prior to Admission medications   Medication Sig Start Date End Date Taking? Authorizing Provider  diphenhydrAMINE (BENADRYL) 25 MG tablet Take 25 mg by mouth every 6 (six) hours as needed for allergies.   Yes Historical Provider, MD  Flaxseed, Linseed, (EQL FLAX SEED OIL) 1000 MG CAPS Take 1,000 mg by mouth every morning.   Yes Historical Provider, MD  hydrochlorothiazide (HYDRODIURIL) 25 MG tablet Take 1 tablet (25 mg total) by mouth daily. 03/04/15  Yes Linna Hoff, MD  Multiple Vitamin (MULTIVITAMIN WITH MINERALS) TABS tablet Take 1 tablet by mouth daily.   Yes Historical Provider, MD  ibuprofen (ADVIL,MOTRIN) 400 MG tablet Take 1 tablet (400 mg total) by mouth every 6 (six) hours as needed. 05/27/15   Rolan Bucco, MD   BP 148/109 mmHg  Pulse 62  Temp(Src) 97.4 F (36.3 C) (Oral)  Resp 18  SpO2 95% Physical Exam  Constitutional: He is oriented to  person, place, and time. He appears well-developed and well-nourished.  HENT:  Head: Normocephalic and atraumatic.  Eyes: Pupils are equal, round, and reactive to light.  Neck: Normal range of motion. Neck supple.  Cardiovascular: Normal rate, regular rhythm and normal heart sounds.   Pulmonary/Chest: Effort normal and breath sounds normal. No respiratory distress. He has no wheezes. He has no rales. He exhibits tenderness (positive tenderness on palpation  of the left chest wall. The pain is reproduced with movement of the left arm as well.).  Abdominal: Soft. Bowel sounds are normal. There is no tenderness. There is no rebound and no guarding.  Musculoskeletal: Normal range of motion. He exhibits no edema.  Patient has some slight edema of the left leg as compared to the right which he says is chronic due to prior tib-fib fracture. He denies any increased edema or calf tenderness.  Lymphadenopathy:    He has no cervical adenopathy.  Neurological: He is alert and oriented to person, place, and time.  Skin: Skin is warm and dry. No rash noted.  Psychiatric: He has a normal mood and affect.    ED Course  Procedures (including critical care time) Labs Review Labs Reviewed  BASIC METABOLIC PANEL - Abnormal; Notable for the following:    Glucose, Bld 107 (*)    All other components within normal limits  LIPASE, BLOOD - Abnormal; Notable for the following:    Lipase 79 (*)    All other components within normal limits  CBC  TROPONIN I  D-DIMER, QUANTITATIVE (NOT AT [facility])  Rosezena SensorI-STAT TROPOININ, ED    Imaging Review Dg Chest 2 View  05/27/2015  CLINICAL DATA:  Woke up with LEFT chest pain and difficulty breathing. History of hypertension hypercholesterolemia. EXAM: CHEST  2 VIEW COMPARISON:  Chest radiograph Jul 13, 2014 FINDINGS: The cardiac silhouette is mildly enlarged. Mildly tortuous aorta. No pleural effusion or focal consolidation. Mild bronchitic changes. No pneumothorax. Soft tissue planes and included osseous structure nonsuspicious. Large body habitus. IMPRESSION: Stable mild cardiomegaly and mild bronchitic changes. Electronically Signed   By: Awilda Metroourtnay  Bloomer M.D.   On: 05/27/2015 05:22   I have personally reviewed and evaluated these images and lab results as part of my medical decision-making.   EKG Interpretation   Date/Time:  Thursday May 27 2015 04:28:50 EDT Ventricular Rate:  84 PR Interval:  152 QRS Duration: 72 QT  Interval:  346 QTC Calculation: 408 R Axis:   58 Text Interpretation:  Sinus rhythm with Premature atrial complexes  Nonspecific T wave abnormality Abnormal ECG T wave inversion, similar to  EKG from 05/08/2008 Confirmed by Clella Mckeel  MD, Nicie Milan ([id number]) on 05/27/2015  4:53:58 AM      MDM   Final diagnoses:  Chest pain, unspecified chest pain type  Nonintractable paroxysmal hemicrania, unspecified chronicity pattern    Patient presents with left-sided chest pain. It's worse with movement and worse with palpation. It seems to be musculoskeletal. There is no evidence of pneumothorax or pneumonia. His d-dimer is negative and he has no other suggestions of pulmonary embolus. His EKG does not show ischemic changes. His troponin is negative. He will need a repeat troponin.  He also has a bifrontal-type headache. He's had a history of migrainous type headaches in the past.  He has no neck pain or other symptoms that would be more suggestive of SAH or meningitis.   He did not get much relief with Toradol. We'll try a dose of Percocet.  Pt care turned over  to Dr. Manus Gunning pending repeat troponin.  If neg, can be discharged home.    Rolan Bucco, MD 05/27/15 [phone]

## 2015-05-27 NOTE — ED Notes (Signed)
Dr. Fredderick Phenixbelfi at the bedside to update, repeat troponin needed.

## 2015-05-27 NOTE — ED Notes (Signed)
MD Rancour at the bedside.   

## 2015-05-27 NOTE — ED Provider Notes (Signed)
Care assumed from Dr. Fredderick PhenixBelfi.  Patient with chest pain since 4 AM that is reproducible. Second troponin pending. D-dimer negative.  Patient with history of hypertension and did not take medication today. Also has headache that woke him from sleep at 4 AM is progressively worsening. Similar to previous migraines. No focal weakness, numbness or tingling. No visual change. Is photophobic. Patient feels improved after reglan and benadryl.  He already received toradol. CT head negative.  Troponin negative x2.  Headache has improved.  Patient appears stable for discharge plan established by Dr. Fredderick PhenixBelfi.   BP 132/92 mmHg  Pulse 62  Temp(Src) 98.2 F (36.8 C) (Oral)  Resp 17  SpO2 99% +  Glynn OctaveStephen Shellsea Borunda, MD 05/27/15 1607

## 2015-05-27 NOTE — ED Notes (Signed)
MD at the bedside  

## 2015-05-27 NOTE — ED Notes (Signed)
Verified, patient is currently in xray. Awaiting return

## 2015-05-27 NOTE — Discharge Instructions (Signed)
Nonspecific Chest Pain  °Chest pain can be caused by many different conditions. There is always a chance that your pain could be related to something serious, such as a heart attack or a blood clot in your lungs. Chest pain can also be caused by conditions that are not life-threatening. If you have chest pain, it is very important to follow up with your health care provider. °CAUSES  °Chest pain can be caused by: °· Heartburn. °· Pneumonia or bronchitis. °· Anxiety or stress. °· Inflammation around your heart (pericarditis) or lung (pleuritis or pleurisy). °· A blood clot in your lung. °· A collapsed lung (pneumothorax). It can develop suddenly on its own (spontaneous pneumothorax) or from trauma to the chest. °· Shingles infection (varicella-zoster virus). °· Heart attack. °· Damage to the bones, muscles, and cartilage that make up your chest wall. This can include: °¨ Bruised bones due to injury. °¨ Strained muscles or cartilage due to frequent or repeated coughing or overwork. °¨ Fracture to one or more ribs. °¨ Sore cartilage due to inflammation (costochondritis). °RISK FACTORS  °Risk factors for chest pain may include: °· Activities that increase your risk for trauma or injury to your chest. °· Respiratory infections or conditions that cause frequent coughing. °· Medical conditions or overeating that can cause heartburn. °· Heart disease or family history of heart disease. °· Conditions or health behaviors that increase your risk of developing a blood clot. °· Having had chicken pox (varicella zoster). °SIGNS AND SYMPTOMS °Chest pain can feel like: °· Burning or tingling on the surface of your chest or deep in your chest. °· Crushing, pressure, aching, or squeezing pain. °· Dull or sharp pain that is worse when you move, cough, or take a deep breath. °· Pain that is also felt in your back, neck, shoulder, or arm, or pain that spreads to any of these areas. °Your chest pain may come and go, or it may stay  constant. °DIAGNOSIS °Lab tests or other studies may be needed to find the cause of your pain. Your health care provider may have you take a test called an ambulatory ECG (electrocardiogram). An ECG records your heartbeat patterns at the time the test is performed. You may also have other tests, such as: °· Transthoracic echocardiogram (TTE). During echocardiography, sound waves are used to create a picture of all of the heart structures and to look at how blood flows through your heart. °· Transesophageal echocardiogram (TEE). This is a more advanced imaging test that obtains images from inside your body. It allows your health care provider to see your heart in finer detail. °· Cardiac monitoring. This allows your health care provider to monitor your heart rate and rhythm in real time. °· Holter monitor. This is a portable device that records your heartbeat and can help to diagnose abnormal heartbeats. It allows your health care provider to track your heart activity for several days, if needed. °· Stress tests. These can be done through exercise or by taking medicine that makes your heart beat more quickly. °· Blood tests. °· Imaging tests. °TREATMENT  °Your treatment depends on what is causing your chest pain. Treatment may include: °· Medicines. These may include: °¨ Acid blockers for heartburn. °¨ Anti-inflammatory medicine. °¨ Pain medicine for inflammatory conditions. °¨ Antibiotic medicine, if an infection is present. °¨ Medicines to dissolve blood clots. °¨ Medicines to treat coronary artery disease. °· Supportive care for conditions that do not require medicines. This may include: °¨ Resting. °¨ Applying heat   or cold packs to injured areas. °¨ Limiting activities until pain decreases. °HOME CARE INSTRUCTIONS °· If you were prescribed an antibiotic medicine, finish it all even if you start to feel better. °· Avoid any activities that bring on chest pain. °· Do not use any tobacco products, including  cigarettes, chewing tobacco, or electronic cigarettes. If you need help quitting, ask your health care provider. °· Do not drink alcohol. °· Take medicines only as directed by your health care provider. °· Keep all follow-up visits as directed by your health care provider. This is important. This includes any further testing if your chest pain does not go away. °· If heartburn is the cause for your chest pain, you may be told to keep your head raised (elevated) while sleeping. This reduces the chance that acid will go from your stomach into your esophagus. °· Make lifestyle changes as directed by your health care provider. These may include: °¨ Getting regular exercise. Ask your health care provider to suggest some activities that are safe for you. °¨ Eating a heart-healthy diet. A registered dietitian can help you to learn healthy eating options. °¨ Maintaining a healthy weight. °¨ Managing diabetes, if necessary. °¨ Reducing stress. °SEEK MEDICAL CARE IF: °· Your chest pain does not go away after treatment. °· You have a rash with blisters on your chest. °· You have a fever. °SEEK IMMEDIATE MEDICAL CARE IF:  °· Your chest pain is worse. °· You have an increasing cough, or you cough up blood. °· You have severe abdominal pain. °· You have severe weakness. °· You faint. °· You have chills. °· You have sudden, unexplained chest discomfort. °· You have sudden, unexplained discomfort in your arms, back, neck, or jaw. °· You have shortness of breath at any time. °· You suddenly start to sweat, or your skin gets clammy. °· You feel nauseous or you vomit. °· You suddenly feel light-headed or dizzy. °· Your heart begins to beat quickly, or it feels like it is skipping beats. °These symptoms may represent a serious problem that is an emergency. Do not wait to see if the symptoms will go away. Get medical help right away. Call your local emergency services (911 in the [place] Do not drive yourself to the hospital. °  °This  information is not intended to replace advice given to you by your health care provider. Make sure you discuss any questions you have with your health care provider. °  °Document Released: 11/09/2004 Document Revised: 02/20/2014 Document Reviewed: 09/05/2013 °Elsevier Interactive Patient Education ©2016 Elsevier Inc. ° °

## 2015-06-02 ENCOUNTER — Encounter: Payer: Self-pay | Admitting: Internal Medicine

## 2015-06-02 ENCOUNTER — Other Ambulatory Visit: Payer: Self-pay | Admitting: Internal Medicine

## 2015-06-02 ENCOUNTER — Ambulatory Visit: Payer: Medicaid Other | Attending: Internal Medicine | Admitting: Internal Medicine

## 2015-06-02 VITALS — BP 185/122 | HR 87 | Temp 98.4°F | Ht 76.0 in | Wt 352.4 lb

## 2015-06-02 DIAGNOSIS — R519 Headache, unspecified: Secondary | ICD-10-CM

## 2015-06-02 DIAGNOSIS — R51 Headache: Secondary | ICD-10-CM

## 2015-06-02 DIAGNOSIS — I10 Essential (primary) hypertension: Secondary | ICD-10-CM | POA: Insufficient documentation

## 2015-06-02 MED ORDER — LISINOPRIL-HYDROCHLOROTHIAZIDE 20-25 MG PO TABS: 1.0000 | ORAL_TABLET | Freq: Every day | ORAL | Status: DC

## 2015-06-02 MED ORDER — FEXOFENADINE HCL 60 MG PO TABS: 60.0000 mg | ORAL_TABLET | Freq: Two times a day (BID) | ORAL | Status: DC

## 2015-06-02 MED ORDER — DEXTROMETHORPHAN HBR 15 MG/5ML PO SYRP: 10.0000 mL | ORAL_SOLUTION | Freq: Every evening | ORAL | Status: DC | PRN

## 2015-06-02 MED FILL — LISINOPRIL-HCTZ 20-25 MG TA: 20-25 | 30 days supply | Qty: 30 | Fill #0

## 2015-06-02 NOTE — Progress Notes (Signed)
Patient here to establish care and to refill prescriptions.  Patient feels all right, but complains of a headache that he thinks is from the generic form of his blood pressure medication.  Patient reports he has been having headaches ever since he began taking Hydrochlorothiazide.  Patient rates pain 7/10.  Patient requests something to help him sleep at night due to coughing from sinus drainage with seasonal allergies.  Patient reports Morphine and Benadryl makes him itch.  Patient needs refill on Allegra, requests 4257-month supply of Hydrochlorothiazide (not generic form).  Patient would like something stronger than Ibuprofen 400mg  to take to help with knee pain.  Patient states he has to take 4 pills for it to help.

## 2015-06-02 NOTE — Assessment & Plan Note (Signed)
?   Related to hctz (doubt as he has taken for quite some time)  Could be related to Blood pressure- treat as above

## 2015-06-02 NOTE — Progress Notes (Signed)
Pt here to establish  He has hx of htn- treated with hctz Says since last refill of hctz he has had a diffuse headache- can be severe: 8/10.   He tells me he had a stroke-- i wonder whether he had bell's pasly (documented as stroke in [facility] as that is what the patient has said)  Patient states he has lost 200 pounds over the past 3-4 years.   Also complains of allergies- has nocturnal cough he relates to pollen  Denies chest pain  Past Medical History  Diagnosis Date  . Hypertension   . Hypercholesteremia   . Stroke (HCC)   . Stroke [facility](HCC) 2012    Social History   Social History  . Marital Status: Single    Spouse Name: N/A  . Number of Children: N/A  . Years of Education: N/A   Occupational History  . Not on file.   Social History Main Topics  . Smoking status: Light Tobacco Smoker    Types: Cigars  . Smokeless tobacco: Not on file  . Alcohol Use: No     Comment: socially  . Drug Use: No  . Sexual Activity: Not on file   Other Topics Concern  . Not on file   Social History Narrative    Past Surgical History  Procedure Laterality Date  . Orthopedic surgery    . Orif tibia & fibula fractures    . Orif radius & ulna fractures      Family History  Problem Relation Age of Onset  . Hypertension Mother   . Hypertension Father     Allergies  Allergen Reactions  . Tea Anaphylaxis    Current Outpatient Prescriptions on File Prior to Visit  Medication Sig Dispense Refill  . Flaxseed, Linseed, (EQL FLAX SEED OIL) 1000 MG CAPS Take 1,000 mg by mouth every morning.    . Multiple Vitamin (MULTIVITAMIN WITH MINERALS) TABS tablet Take 1 tablet by mouth daily.    . [DISCONTINUED] cetirizine (ZYRTEC) 10 MG tablet Take 1 tablet (10 mg total) by mouth daily. One tab daily for allergies (Patient not taking: Reported on 02/04/2014) 30 tablet 1  . [DISCONTINUED] fluticasone (FLONASE) 50 MCG/ACT nasal spray Place 1 spray into both nostrils 2 (two) times daily. (Patient not  taking: Reported on 02/04/2014) 1 g 2  . [DISCONTINUED] lisinopril (PRINIVIL,ZESTRIL) 20 MG tablet Take 1 tablet (20 mg total) by mouth daily. (Patient not taking: Reported on 02/04/2014) 30 tablet 0  . [DISCONTINUED] lovastatin (MEVACOR) 40 MG tablet Take 1 tablet (40 mg total) by mouth at bedtime. (Patient not taking: Reported on 02/04/2014) 90 tablet 1  . [DISCONTINUED] Olopatadine HCl 0.6 % SOLN 2 sprays/nostril BID (Patient not taking: Reported on 06/12/2014) 1 Bottle 1   No current facility-administered medications on file prior to visit.     patient denies chest pain, shortness of breath, orthopnea. Denies lower extremity edema, abdominal pain, change in appetite, change in bowel movements. Patient denies rashes, musculoskeletal complaints. No other specific complaints in a complete review of systems.   BP 185/122 mmHg  Pulse 87  Temp(Src) 98.4 F (36.9 C) (Oral)  Ht 6\' 4"  (1.93 m)  Wt 352 lb 6.4 oz (159.848 kg)  BMI 42.91 kg/m2  SpO2 96%  overweight male in no acute distress. HEENT exam atraumatic, normocephalic, neck supple without jugular venous distention. Chest clear to auscultation cardiac exam S1-S2 are regular. Abdominal exam overweight with bowel sounds, soft and nontender. Extremities no edema. Neurologic exam is alert with a  normal gait.  Eyes- pupils ERRL, no papilledema   Hypertension Recheck bp at 160/100 He needs more aggressive treatment Change to lisinopril/hctz Suspect that will help his headache too  Headache ? Related to hctz (doubt as he has taken for quite some time)  Could be related to Blood pressure- treat as above

## 2015-06-02 NOTE — Assessment & Plan Note (Signed)
Recheck bp at 160/100 He needs more aggressive treatment Change to lisinopril/hctz Suspect that will help his headache too

## 2015-07-19 MED FILL — LISINOPRIL-HCTZ 20-25 MG TA: 20-25 | 30 days supply | Qty: 30 | Fill #1

## 2015-08-27 ENCOUNTER — Emergency Department (HOSPITAL_COMMUNITY): Payer: No Typology Code available for payment source

## 2015-08-27 ENCOUNTER — Emergency Department (HOSPITAL_COMMUNITY)
Admission: EM | Admit: 2015-08-27 | Discharge: 2015-08-27 | Disposition: A | Discharge status: Home / Self Care | Payer: No Typology Code available for payment source | Attending: Emergency Medicine | Admitting: Emergency Medicine

## 2015-08-27 ENCOUNTER — Encounter (HOSPITAL_COMMUNITY): Payer: Self-pay | Admitting: *Deleted

## 2015-08-27 DIAGNOSIS — Y92410 Unspecified street and highway as the place of occurrence of the external cause: Secondary | ICD-10-CM | POA: Insufficient documentation

## 2015-08-27 DIAGNOSIS — Y999 Unspecified external cause status: Secondary | ICD-10-CM | POA: Diagnosis not present

## 2015-08-27 DIAGNOSIS — Z8673 Personal history of transient ischemic attack (TIA), and cerebral infarction without residual deficits: Secondary | ICD-10-CM | POA: Diagnosis not present

## 2015-08-27 DIAGNOSIS — I10 Essential (primary) hypertension: Secondary | ICD-10-CM | POA: Insufficient documentation

## 2015-08-27 DIAGNOSIS — M546 Pain in thoracic spine: Secondary | ICD-10-CM | POA: Insufficient documentation

## 2015-08-27 DIAGNOSIS — Y939 Activity, unspecified: Secondary | ICD-10-CM | POA: Insufficient documentation

## 2015-08-27 DIAGNOSIS — M25562 Pain in left knee: Secondary | ICD-10-CM | POA: Diagnosis not present

## 2015-08-27 DIAGNOSIS — F17200 Nicotine dependence, unspecified, uncomplicated: Secondary | ICD-10-CM | POA: Diagnosis not present

## 2015-08-27 MED ORDER — OXYCODONE-ACETAMINOPHEN 5-325 MG PO TABS
ORAL_TABLET | ORAL | Status: AC
  Filled 2015-08-27: qty 1

## 2015-08-27 MED ORDER — IBUPROFEN 600 MG PO TABS: 600.0000 mg | ORAL_TABLET | Freq: Four times a day (QID) | ORAL | Status: DC | PRN

## 2015-08-27 MED ORDER — IBUPROFEN 400 MG PO TABS
800.0000 mg | ORAL_TABLET | Freq: Once | ORAL | Status: AC
  Administered 2015-08-27: 800 mg via ORAL
  Filled 2015-08-27: qty 2

## 2015-08-27 MED ORDER — OXYCODONE-ACETAMINOPHEN 5-325 MG PO TABS
1.0000 | ORAL_TABLET | ORAL | Status: DC | PRN
  Administered 2015-08-27: 1 via ORAL
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
## 2015-08-27 NOTE — ED Notes (Signed)
Provided patient with ginger ale and graham crackers per MD.

## 2015-08-27 NOTE — ED Notes (Signed)
Patient verbalized understanding of discharge instructions and denies any further needs or questions at this time. VS stable. Patient ambulatory with steady gait, escorted to ED entrance in wheelchair.   
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
## 2015-08-30 ENCOUNTER — Encounter (HOSPITAL_COMMUNITY): Payer: Self-pay | Admitting: Emergency Medicine

## 2015-08-30 ENCOUNTER — Emergency Department (HOSPITAL_COMMUNITY): Payer: No Typology Code available for payment source

## 2015-08-30 ENCOUNTER — Emergency Department (HOSPITAL_COMMUNITY)
Admission: EM | Admit: 2015-08-30 | Discharge: 2015-08-30 | Disposition: A | Discharge status: Home / Self Care | Payer: No Typology Code available for payment source | Attending: Emergency Medicine | Admitting: Emergency Medicine

## 2015-08-30 DIAGNOSIS — Z8673 Personal history of transient ischemic attack (TIA), and cerebral infarction without residual deficits: Secondary | ICD-10-CM | POA: Insufficient documentation

## 2015-08-30 DIAGNOSIS — Y939 Activity, unspecified: Secondary | ICD-10-CM | POA: Insufficient documentation

## 2015-08-30 DIAGNOSIS — M25562 Pain in left knee: Secondary | ICD-10-CM | POA: Insufficient documentation

## 2015-08-30 DIAGNOSIS — M62830 Muscle spasm of back: Secondary | ICD-10-CM

## 2015-08-30 DIAGNOSIS — S060X0A Concussion without loss of consciousness, initial encounter: Secondary | ICD-10-CM | POA: Insufficient documentation

## 2015-08-30 DIAGNOSIS — S0990XA Unspecified injury of head, initial encounter: Secondary | ICD-10-CM | POA: Diagnosis present

## 2015-08-30 DIAGNOSIS — Z79899 Other long term (current) drug therapy: Secondary | ICD-10-CM | POA: Insufficient documentation

## 2015-08-30 DIAGNOSIS — I10 Essential (primary) hypertension: Secondary | ICD-10-CM | POA: Insufficient documentation

## 2015-08-30 DIAGNOSIS — F17210 Nicotine dependence, cigarettes, uncomplicated: Secondary | ICD-10-CM | POA: Insufficient documentation

## 2015-08-30 DIAGNOSIS — M545 Low back pain, unspecified: Secondary | ICD-10-CM

## 2015-08-30 DIAGNOSIS — Y92410 Unspecified street and highway as the place of occurrence of the external cause: Secondary | ICD-10-CM | POA: Diagnosis not present

## 2015-08-30 DIAGNOSIS — Y999 Unspecified external cause status: Secondary | ICD-10-CM | POA: Insufficient documentation

## 2015-08-30 MED ORDER — NAPROXEN 500 MG PO TABS: 500.0000 mg | ORAL_TABLET | Freq: Two times a day (BID) | ORAL | Status: DC | PRN

## 2015-08-30 MED ORDER — HYDROCODONE-ACETAMINOPHEN 5-325 MG PO TABS: 1.0000 | ORAL_TABLET | Freq: Four times a day (QID) | ORAL | Status: DC | PRN

## 2015-08-30 MED ORDER — CYCLOBENZAPRINE HCL 10 MG PO TABS: 10.0000 mg | ORAL_TABLET | Freq: Three times a day (TID) | ORAL | Status: DC | PRN
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
## 2015-11-02 ENCOUNTER — Ambulatory Visit (INDEPENDENT_AMBULATORY_CARE_PROVIDER_SITE_OTHER): Payer: Self-pay

## 2015-11-02 ENCOUNTER — Encounter (HOSPITAL_COMMUNITY): Payer: Self-pay | Admitting: *Deleted

## 2015-11-02 ENCOUNTER — Ambulatory Visit (HOSPITAL_COMMUNITY)
Admission: EM | Admit: 2015-11-02 | Discharge: 2015-11-02 | Disposition: A | Discharge status: Home / Self Care | Payer: Self-pay | Attending: Emergency Medicine | Admitting: Emergency Medicine

## 2015-11-02 DIAGNOSIS — M545 Low back pain: Secondary | ICD-10-CM

## 2015-11-02 DIAGNOSIS — M5410 Radiculopathy, site unspecified: Secondary | ICD-10-CM

## 2015-11-02 DIAGNOSIS — I159 Secondary hypertension, unspecified: Secondary | ICD-10-CM

## 2015-11-02 MED ORDER — KETOROLAC TROMETHAMINE 60 MG/2ML IM SOLN
60.0000 mg | Freq: Once | INTRAMUSCULAR | Status: AC
  Administered 2015-11-02: 60 mg via INTRAMUSCULAR

## 2015-11-02 MED ORDER — HYDROCODONE-ACETAMINOPHEN 5-325 MG PO TABS: 1.0000 | ORAL_TABLET | Freq: Four times a day (QID) | ORAL | 0 refills | Status: DC | PRN

## 2015-11-02 MED ORDER — DICLOFENAC SODIUM 75 MG PO TBEC: 75.0000 mg | DELAYED_RELEASE_TABLET | Freq: Two times a day (BID) | ORAL | 0 refills | Status: DC

## 2015-11-02 MED ORDER — PREDNISONE 10 MG (21) PO TBPK: ORAL_TABLET | ORAL | 0 refills | Status: DC

## 2015-11-02 MED ORDER — KETOROLAC TROMETHAMINE 60 MG/2ML IM SOLN
INTRAMUSCULAR | Status: AC
  Filled 2015-11-02: qty 2

## 2015-11-02 NOTE — ED Provider Notes (Signed)
HPI  SUBJECTIVE:  [name] [name] is a 35 y.o. male who presents with constant left lower back pain starting yesterday. Patient states that he woke up with it. He reports intermittent left foot and medial calf numbness and shooting pain going down the back of his leg. Symptoms are better with lying down, massage, and worse with standing up or sitting up for prolonged periods of time. He tried "back pain aspirin" earlier today. He does not recalling any recent trauma, lifting, bending, or twisting. Denies chest pain, shortness of breath area. denies N/V, fevers, flank pain, abdominal pain, urinary urgency, frequency, dysuria, cloudy or odorous urine, hematuria.  No syncope. No saddle anesthesia,  bilateral radicular leg pain/weakness, fevers/night sweats,  recent h/o trauma,  bladder/ bowel incontinence, urinary retention, h/o CA / multiple myleoma, unexplained weight loss, pain worse at night,   h/o IVDU, h/o HIV, recent bacteremia, known AAA.  no h/o pyelonephritis, nephrolithiasis.  Patient has a past medical history of stroke, hypertension for which she takes lisinopril. Has not taken it today. Also hypercholesterolemia. No history of kidney disease. [facility]   Past Medical History:  Diagnosis Date  . Hypercholesteremia   . Hypertension   . Stroke (HCC)   . Stroke [facility](HCC) 2012    Past Surgical History:  Procedure Laterality Date  . ORIF RADIUS & ULNA FRACTURES    . ORIF TIBIA & FIBULA FRACTURES    . ORTHOPEDIC SURGERY      Family History  Problem Relation Age of Onset  . Hypertension Mother   . Hypertension Father     Social History  Substance Use Topics  . Smoking status: Light Tobacco Smoker    Types: Cigars  . Smokeless tobacco: Not on file  . Alcohol use No     Comment: socially    No current facility-administered medications for this encounter.   Current Outpatient Prescriptions:  .  cyclobenzaprine (FLEXERIL) 10 MG tablet, Take 1 tablet (10 mg  total) by mouth 3 (three) times daily as needed for muscle spasms., Disp: 15 tablet, Rfl: 0 .  diclofenac (VOLTAREN) 75 MG EC tablet, Take 1 tablet (75 mg total) by mouth 2 (two) times daily. Take with food, Disp: 30 tablet, Rfl: 0 .  fexofenadine (ALLEGRA) 60 MG tablet, Take 1 tablet (60 mg total) by mouth 2 (two) times daily., Disp: 60 tablet, Rfl: 1 .  Flaxseed, Linseed, (EQL FLAX SEED OIL) 1000 MG CAPS, Take 1,000 mg by mouth every morning., Disp: , Rfl:  .  HYDROcodone-acetaminophen (NORCO) 5-325 MG tablet, Take 1-2 tablets by mouth every 6 (six) hours as needed for severe pain., Disp: 20 tablet, Rfl: 0 .  lisinopril-hydrochlorothiazide (PRINZIDE,ZESTORETIC) 20-25 MG tablet, Take 1 tablet by mouth daily., Disp: 90 tablet, Rfl: 3 .  Multiple Vitamin (MULTIVITAMIN WITH MINERALS) TABS tablet, Take 1 tablet by mouth daily., Disp: , Rfl:  .  predniSONE (STERAPRED UNI-PAK 21 TAB) 10 MG (21) TBPK tablet, Dispense one 6 day pack. Take as directed with food., Disp: 21 tablet, Rfl: 0  Allergies  Allergen Reactions  . Tea Anaphylaxis     ROS  As noted in HPI.   Physical Exam  BP (!) 160/102   Pulse 91   Temp 98.6 F (37 C) (Oral)   Resp 21   SpO2 98%    BP Readings from Last 3 Encounters:  11/02/15 (!) 160/102  08/30/15 (!) 158/109  08/27/15 (!) 177/111   Constitutional: Well developed, well nourished, no acute distress Eyes:  EOMI, conjunctiva normal bilaterally HENT: Normocephalic, atraumatic,mucus membranes moist Respiratory: Normal inspiratory effort Cardiovascular: Normal rate GI: nondistended. No suprapubic tenderness skin: No rash, skin intact Musculoskeletal: no CVAT. + paralumbar tenderness L side , - muscle spasm. + bony tenderness L4, L5.  Bilateral lower extremities nontender, baseline ROM with intact PT pulses. Pain with Right and left active hip flexion, left hip extension against resistance. States that adduction bilaterally "pulls" on his back. No pain with passive  int/ext rotation flex/extension hips bilaterally. SLR neg bilaterally. Sensation baseline light touch bilaterally for Pt, DTR's symmetric and intact bilaterally KJ, Motor symmetric bilateral 5/5 hip flexion, quadriceps, hamstrings, EHL, foot dorsiflexion, foot plantarflexion, gait somewhat antalgic but without apparent new ataxia.  Decreased sensation to sharp and soft over the medial calf. Neurologic: Alert & oriented x 3, no focal neuro deficits Psychiatric: Speech and behavior appropriate   ED Course   Medications  ketorolac (TORADOL) injection 60 mg (60 mg Intramuscular Given 11/02/15 2051)    Orders Placed This Encounter  Procedures  . DG Lumbar Spine Complete    Standing Status:   Standing    Number of Occurrences:   1    Order Specific Question:   Reason for Exam (SYMPTOM  OR DIAGNOSIS REQUIRED)    Answer:   L LBP with L sided numbness r/o fx or other acute pathology  . AMB referral to orthopedics    Referral Priority:   Urgent    Referral Type:   Consultation    Referral Reason:   Specialty Services Required    Number of Visits Requested:   1  . Ambulatory referral to Neurosurgery    Referral Priority:   Urgent    Referral Type:   Surgical    Referral Reason:   Specialty Services Required    Requested Specialty:   Neurosurgery    Number of Visits Requested:   1    No results found for this or any previous visit (from the past 24 hour(s)). Dg Lumbar Spine Complete  Result Date: 11/02/2015 CLINICAL DATA:  Low back and left leg pain. EXAM: LUMBAR SPINE - COMPLETE 4+ VIEW COMPARISON:  CT scan 328 1,010 FINDINGS: Normal alignment of the lumbar vertebral bodies. Disc spaces and vertebral bodies are maintained. The facets are normally aligned. No pars defects. The visualized bony pelvis is intact. IMPRESSION: Normal lumbar spine series Electronically Signed   By: Rudie Meyer M.D.   On: 11/02/2015 21:12    ED Clinical Impression  Left low back pain, with sciatica presence  unspecified  Acute low back pain with radicular symptoms, duration less than 6 weeks  Secondary hypertension, unspecified   ED Assessment/Plan  [place] narcotic database reviewed. Pt with no narcotic rx In the past 6 months.  In the differential is aortic abdominal aneurysm, however, he has radicular symptoms, reproducible back pain and no abdominal pain.  Patient is denying any symptoms of hypertensive emergency such as headache, visual changes, chest pain, shortness of breath, pain tearing through to his back, anuria, hematuria, new or different lower extremity edema, syncope or seizures.  Imaging independently reviewed. No fracture, dislocation. See radiology report for details.  he also took home dose of lisinopril while here in the department. BP was trending down prior to discharge.  On reevaluation, patient states that the Toradol did not help much, however, No new findings on re-exam. Pt ambulatory in the ED. Home with NSAID, APAP/narcotic, steriod. Pt to f/u with ortho spine in 1- 2 days, referring to  [facility], orthopedic service on call. We'll also refer to Dr. Danielle Dess, [facility] if he cannot be seen by orthopedics.  Discussed imaging, medical decision-making, and plan for follow-up with the patient.  Discussed signs and symptoms that should prompt return to the emergency department.  Patient agrees with plan.   *This clinic note was created using Dragon dictation software. Therefore, there may be occasional mistakes despite careful proofreading.  ?    Domenick Gong, MD 11/03/15 [phone]

## 2015-11-02 NOTE — Discharge Instructions (Signed)
Follow-up with either [facility] or with [facility] within 2 days. Go to the ER if you accidentally urinate on yourself, defecate on yourself, if you cannot urinate, if the numbness gets worse, if you having weakness in your leg,, abdominal pain, or if you pass out.  Decrease your salt intake. diet and exercise will lower your blood pressure significantly. It is important to keep your blood pressure under good control, as having a elevated for prolonged periods of time significantly increases your risk of stroke, heart attacks, kidney damage, eye damage, and other problems. R Return immediately to the ER if you start having chest pain, headache, problems seeing, problems talking, problems walking, if you feel like you're about to pass out, if you do pass out, if you have a seizure, or for any other concerns..Marland Kitchen

## 2015-11-02 NOTE — ED Triage Notes (Signed)
Pt reports     Back  Pain   Pt  Has  A  Flair  Up     Chronic  Back  Pain        Pt reports   He woke  Up  With    The  Pain       Yesterday    Pt   Reports  A  Toothache  As  Well      Pt  denys  Any  specefic  Injury   Recently

## 2016-02-29 ENCOUNTER — Encounter (HOSPITAL_COMMUNITY): Payer: Self-pay | Admitting: Family Medicine

## 2016-02-29 ENCOUNTER — Ambulatory Visit (INDEPENDENT_AMBULATORY_CARE_PROVIDER_SITE_OTHER): Payer: Self-pay

## 2016-02-29 ENCOUNTER — Ambulatory Visit (HOSPITAL_COMMUNITY)
Admission: EM | Admit: 2016-02-29 | Discharge: 2016-02-29 | Disposition: A | Discharge status: Home / Self Care | Payer: Self-pay | Attending: Family Medicine | Admitting: Family Medicine

## 2016-02-29 DIAGNOSIS — S6992XA Unspecified injury of left wrist, hand and finger(s), initial encounter: Secondary | ICD-10-CM

## 2016-02-29 DIAGNOSIS — S60212A Contusion of left wrist, initial encounter: Secondary | ICD-10-CM

## 2016-02-29 DIAGNOSIS — S60212S Contusion of left wrist, sequela: Secondary | ICD-10-CM

## 2016-02-29 MED ORDER — NAPROXEN 500 MG PO TABS: 500.0000 mg | ORAL_TABLET | Freq: Two times a day (BID) | ORAL | 0 refills | Status: DC

## 2016-02-29 MED ORDER — KETOROLAC TROMETHAMINE 60 MG/2ML IM SOLN
INTRAMUSCULAR | Status: AC
  Filled 2016-02-29: qty 2

## 2016-02-29 MED ORDER — KETOROLAC TROMETHAMINE 60 MG/2ML IM SOLN
60.0000 mg | Freq: Once | INTRAMUSCULAR | Status: AC
  Administered 2016-02-29: 60 mg via INTRAMUSCULAR
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
## 2016-07-04 ENCOUNTER — Encounter (HOSPITAL_COMMUNITY): Payer: Self-pay | Admitting: Emergency Medicine

## 2016-07-04 ENCOUNTER — Ambulatory Visit (HOSPITAL_COMMUNITY)
Admission: EM | Admit: 2016-07-04 | Discharge: 2016-07-04 | Disposition: A | Discharge status: Home / Self Care | Payer: Self-pay | Attending: Internal Medicine | Admitting: Internal Medicine

## 2016-07-04 DIAGNOSIS — F17290 Nicotine dependence, other tobacco product, uncomplicated: Secondary | ICD-10-CM | POA: Insufficient documentation

## 2016-07-04 DIAGNOSIS — I10 Essential (primary) hypertension: Secondary | ICD-10-CM | POA: Insufficient documentation

## 2016-07-04 DIAGNOSIS — E7800 Pure hypercholesterolemia, unspecified: Secondary | ICD-10-CM | POA: Insufficient documentation

## 2016-07-04 DIAGNOSIS — G8929 Other chronic pain: Secondary | ICD-10-CM | POA: Insufficient documentation

## 2016-07-04 DIAGNOSIS — Z760 Encounter for issue of repeat prescription: Secondary | ICD-10-CM | POA: Insufficient documentation

## 2016-07-04 DIAGNOSIS — Z8673 Personal history of transient ischemic attack (TIA), and cerebral infarction without residual deficits: Secondary | ICD-10-CM | POA: Insufficient documentation

## 2016-07-04 DIAGNOSIS — M79662 Pain in left lower leg: Secondary | ICD-10-CM | POA: Insufficient documentation

## 2016-07-04 LAB — BASIC METABOLIC PANEL
ANION GAP: 9 (ref 5–15)
BUN: 8 mg/dL (ref 6–20)
CHLORIDE: 103 mmol/L (ref 101–111)
CO2: 27 mmol/L (ref 22–32)
Calcium: 9 mg/dL (ref 8.9–10.3)
Creatinine, Ser: 1.06 mg/dL (ref 0.61–1.24)
GFR calc Af Amer: >60 mL/min (ref >60)
GFR calc non Af Amer: >60 mL/min (ref >60)
GLUCOSE: 99 mg/dL (ref 65–99)
Potassium: 3.1 mmol/L — ABNORMAL LOW (ref 3.5–5.1)
Sodium: 139 mmol/L (ref 135–145)

## 2016-07-04 MED ORDER — LISINOPRIL-HYDROCHLOROTHIAZIDE 20-25 MG PO TABS: 1.0000 | ORAL_TABLET | Freq: Every day | ORAL | 1 refills | Status: DC

## 2016-07-04 MED ORDER — HYDROCODONE-ACETAMINOPHEN 5-325 MG PO TABS
1.0000 | ORAL_TABLET | Freq: Four times a day (QID) | ORAL | 0 refills | Status: DC | PRN
Start: 2016-07-04 — End: 2016-10-29

## 2016-07-04 NOTE — Discharge Instructions (Addendum)
Prescription for [facility]opril/[facility]CTZ [facility] [address]  Prescription for a small number of vicodin was printed for leg pain.  Please establish care with a primary care provider like [facility] for blood pressure management.  Blood test for kidney function/potassium level was drawn today; the urgent care will contact you if further treatment is needed.

## 2016-07-04 NOTE — ED Provider Notes (Addendum)
[facility]    CSN: [id number] Arrival date & time: 07/04/16  1322     History   Chief Complaint Chief Complaint  Patient presents with  . Medication Refill    HPI [name] [name] is a 36 y.o. male. Ran out of blood pressure medicine this am, some difficulty with PCP leaving for a new town, [facility]. No CP, not short of breath, no headache, no new leg swelling.  Also having flare of chronic pain in left lower leg.    HPI  Past Medical History:  Diagnosis Date  . Hypercholesteremia   . Hypertension   . Stroke (HCC)   . Stroke [facility](HCC) 2012    Patient Active Problem List   Diagnosis Date Noted  . Hypertension 06/02/2015  . Headache 06/02/2015    Past Surgical History:  Procedure Laterality Date  . ORIF RADIUS & ULNA FRACTURES    . ORIF TIBIA & FIBULA FRACTURES    . ORTHOPEDIC SURGERY         Home Medications    Prior to Admission medications   Medication Sig Start Date End Date Taking? Authorizing Provider  HYDROcodone-acetaminophen (NORCO/VICODIN) 5-325 MG tablet Take 1 tablet by mouth 4 (four) times daily as needed. 07/04/16   Eustace MooreMurray, Nikolas Casher W, MD  lisinopril-hydrochlorothiazide (PRINZIDE,ZESTORETIC) 20-25 MG tablet Take 1 tablet by mouth daily. 07/04/16   Eustace MooreMurray, Annlouise Gerety W, MD    Family History Family History  Problem Relation Age of Onset  . Hypertension Mother   . Hypertension Father     Social History Social History  Substance Use Topics  . Smoking status: Light Tobacco Smoker    Types: Cigars  . Smokeless tobacco: Never Used  . Alcohol use No     Comment: socially     Allergies   Tea   Review of Systems Review of Systems  All other systems reviewed and are negative.    Physical Exam Triage Vital Signs ED Triage Vitals  Enc Vitals Group     BP 07/04/16 1336 (!) 175/105     Pulse Rate 07/04/16 1336 82     Resp 07/04/16 1336 20     Temp 07/04/16 1336 98.9 F (37.2 C)     Temp Source  07/04/16 1336 Oral     SpO2 07/04/16 1336 95 %     Weight --      Height --      Pain Score 07/04/16 1334 0     Pain Loc --    Updated Vital Signs BP (!) 175/105 (BP Location: Right Arm)   Pulse 82   Temp 98.9 F (37.2 C) (Oral)   Resp 20   SpO2 95%   Physical Exam  Constitutional: He is oriented to person, place, and time. No distress.  Alert, nicely groomed  HENT:  Head: Atraumatic.  Eyes:  Conjugate gaze, no eye redness/drainage  Neck: Neck supple.  Cardiovascular: Normal rate.   Pulmonary/Chest: No respiratory distress.  Abdominal: He exhibits no distension.  Musculoskeletal: Normal range of motion.  3+ edema left ankle, multiple well healed large scars of left distal leg. Trace edema right ankle.  Neurological: He is alert and oriented to person, place, and time.  Skin: Skin is warm and dry.  No cyanosis  Nursing note and vitals reviewed.    UC Treatments / Results  Labs Results for orders placed or performed during the hospital encounter of 07/04/16  Basic metabolic panel  Result Value  Ref Range   Sodium 139 135 - 145 mmol/L   Potassium 3.1 (L) 3.5 - 5.1 mmol/L   Chloride 103 101 - 111 mmol/L   CO2 27 22 - 32 mmol/L   Glucose, Bld 99 65 - 99 mg/dL   BUN 8 6 - 20 mg/dL   Creatinine, Ser 1.61 0.61 - 1.24 mg/dL   Calcium 9.0 8.9 - 09.6 mg/dL   GFR calc non Af Amer >60 >60 mL/min   GFR calc Af Amer >60 >60 mL/min   Anion gap 9 5 - 15    Procedures Procedures (including critical care time) None today  Final Clinical Impressions(s) / UC Diagnoses   Final diagnoses:  Medication refill  Hypertension, unspecified type   [facility] [address]  Prescription for a small number of vicodin was printed for leg pain.  Please establish care with a primary care provider like [facility] for blood pressure management.  Blood test for kidney function/potassium level was drawn today; the urgent care will contact  you if further treatment is needed.    New Prescriptions Meds ordered this encounter  Medications  . lisinopril-hydrochlorothiazide (PRINZIDE,ZESTORETIC) 20-25 MG tablet    Sig: Take 1 tablet by mouth daily.    Dispense:  90 tablet    Refill:  1  . HYDROcodone-acetaminophen (NORCO/VICODIN) 5-325 MG tablet    Sig: Take 1 tablet by mouth 4 (four) times daily as needed.    Dispense:  15 tablet    Refill:  0       Eustace Moore, MD 07/05/16 0000

## 2016-07-04 NOTE — ED Triage Notes (Signed)
[facility] a [facility] Lis[facility]ril [facility].

## 2016-07-08 ENCOUNTER — Telehealth (HOSPITAL_COMMUNITY): Payer: Self-pay | Admitting: Emergency Medicine

## 2016-07-08 MED ORDER — POTASSIUM CHLORIDE ER 10 MEQ PO TBCR: 10.0000 meq | EXTENDED_RELEASE_TABLET | Freq: Every day | ORAL | 1 refills | Status: DC

## 2016-07-08 NOTE — Telephone Encounter (Signed)
Called pt and notified of recent lab results Pt ID'd properly... Reports feeling good Pt does state he would like for [facility] Per his request.... E-Rx med.  Adv pt if sx are not getting better to return or to f/u w/PCP Notified pt that lab results can be obtained through MyChart Pt verb understanding.

## 2016-07-08 NOTE — Telephone Encounter (Signed)
-----   Message from Eustace MooreLaura W Murray, MD sent at 07/06/2016 10:11 AM EDT ----- Please let patient know that kidney test done at urgent care visit 5/22 was unremarkable.   Potassium level was slightly low at 3.1, and may be related to taking a blood pressure medicine with a mild fluid pill in it.   Low potassium can contribute to mild muscle crampiness/fatigue/palpitations, but is often asymptomatic.   Could try taking a potassium supplement with his blood pressure medicine, or could increase dietary sources of potassium (potatoes, tomatoes, bananas, orange juice).   If he would like to try a potassium supplement, ok to send rx for K-dur 10mEq  Take 1 tab qd with blood pressure medicine #90 RF x 1.  LM
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
## 2016-08-22 ENCOUNTER — Encounter (HOSPITAL_COMMUNITY): Payer: Self-pay

## 2016-08-22 ENCOUNTER — Emergency Department (HOSPITAL_COMMUNITY): Payer: Self-pay

## 2016-08-22 ENCOUNTER — Ambulatory Visit (HOSPITAL_COMMUNITY)
Admission: EM | Admit: 2016-08-22 | Discharge: 2016-08-22 | Disposition: A | Discharge status: Home / Self Care | Payer: Self-pay

## 2016-08-22 ENCOUNTER — Emergency Department (HOSPITAL_COMMUNITY)
Admission: EM | Admit: 2016-08-22 | Discharge: 2016-08-23 | Disposition: A | Discharge status: Home / Self Care | Payer: Self-pay | Attending: Emergency Medicine | Admitting: Emergency Medicine

## 2016-08-22 DIAGNOSIS — I10 Essential (primary) hypertension: Secondary | ICD-10-CM | POA: Insufficient documentation

## 2016-08-22 DIAGNOSIS — F17290 Nicotine dependence, other tobacco product, uncomplicated: Secondary | ICD-10-CM | POA: Insufficient documentation

## 2016-08-22 DIAGNOSIS — G43009 Migraine without aura, not intractable, without status migrainosus: Secondary | ICD-10-CM | POA: Insufficient documentation

## 2016-08-22 DIAGNOSIS — Z79899 Other long term (current) drug therapy: Secondary | ICD-10-CM | POA: Insufficient documentation

## 2016-08-22 DIAGNOSIS — R531 Weakness: Secondary | ICD-10-CM | POA: Insufficient documentation

## 2016-08-22 DIAGNOSIS — Z8673 Personal history of transient ischemic attack (TIA), and cerebral infarction without residual deficits: Secondary | ICD-10-CM | POA: Insufficient documentation

## 2016-08-22 LAB — COMPREHENSIVE METABOLIC PANEL
ALBUMIN: 4 g/dL (ref 3.5–5.0)
ALT: 60 U/L (ref 17–63)
AST: 48 U/L — AB (ref 15–41)
Alkaline Phosphatase: 76 U/L (ref 38–126)
Anion gap: 9 (ref 5–15)
BUN: 12 mg/dL (ref 6–20)
CHLORIDE: 104 mmol/L (ref 101–111)
CO2: 26 mmol/L (ref 22–32)
Calcium: 8.8 mg/dL — ABNORMAL LOW (ref 8.9–10.3)
Creatinine, Ser: 1.11 mg/dL (ref 0.61–1.24)
GFR calc Af Amer: >60 mL/min (ref >60)
GFR calc non Af Amer: >60 mL/min (ref >60)
Glucose, Bld: 112 mg/dL — ABNORMAL HIGH (ref 65–99)
Potassium: 3.3 mmol/L — ABNORMAL LOW (ref 3.5–5.1)
SODIUM: 139 mmol/L (ref 135–145)
Total Bilirubin: 0.5 mg/dL (ref 0.3–1.2)
Total Protein: 7.4 g/dL (ref 6.5–8.1)

## 2016-08-22 LAB — I-STAT CHEM 8, ED
BUN: 11 mg/dL (ref 6–20)
CHLORIDE: 101 mmol/L (ref 101–111)
Calcium, Ion: 1.13 mmol/L — ABNORMAL LOW (ref 1.15–1.40)
Creatinine, Ser: 1.1 mg/dL (ref 0.61–1.24)
Glucose, Bld: 109 mg/dL — ABNORMAL HIGH (ref 65–99)
HCT: 44 % (ref 39.0–52.0)
Hemoglobin: 15 g/dL (ref 13.0–17.0)
POTASSIUM: 3.2 mmol/L — AB (ref 3.5–5.1)
Sodium: 141 mmol/L (ref 135–145)
TCO2: 28 mmol/L (ref 0–100)

## 2016-08-22 LAB — CBC
HEMATOCRIT: 41.7 % (ref 39.0–52.0)
Hemoglobin: 14.6 g/dL (ref 13.0–17.0)
MCH: 29 pg (ref 26.0–34.0)
MCHC: 35 g/dL (ref 30.0–36.0)
MCV: 82.9 fL (ref 78.0–100.0)
PLATELETS: 242 10*3/uL (ref 150–400)
RBC: 5.03 MIL/uL (ref 4.22–5.81)
RDW: 13.7 % (ref 11.5–15.5)
WBC: 10.2 10*3/uL (ref 4.0–10.5)

## 2016-08-22 LAB — DIFFERENTIAL
BASOS ABS: 0.1 10*3/uL (ref 0.0–0.1)
BASOS PCT: 1 %
Eosinophils Absolute: 0.4 10*3/uL (ref 0.0–0.7)
Eosinophils Relative: 4 %
Lymphocytes Relative: 35 %
Lymphs Abs: 3.6 10*3/uL (ref 0.7–4.0)
Monocytes Absolute: 0.9 10*3/uL (ref 0.1–1.0)
Monocytes Relative: 9 %
NEUTROS PCT: 51 %
Neutro Abs: 5.3 10*3/uL (ref 1.7–7.7)

## 2016-08-22 LAB — I-STAT TROPONIN, ED: Troponin i, poc: 0.01 ng/mL (ref 0.00–0.08)

## 2016-08-22 LAB — APTT: APTT: 33 s (ref 24–36)

## 2016-08-22 LAB — ETHANOL

## 2016-08-22 LAB — PROTIME-INR
INR: 1.03
PROTHROMBIN TIME: 13.5 s (ref 11.4–15.2)

## 2016-08-22 MED ORDER — METOCLOPRAMIDE HCL 5 MG/ML IJ SOLN
10.0000 mg | Freq: Once | INTRAMUSCULAR | Status: AC
  Administered 2016-08-22: 10 mg via INTRAVENOUS
  Filled 2016-08-22: qty 2

## 2016-08-22 MED ORDER — MORPHINE SULFATE (PF) 4 MG/ML IV SOLN
8.0000 mg | Freq: Once | INTRAVENOUS | Status: AC
  Administered 2016-08-22: 8 mg via INTRAVENOUS
  Filled 2016-08-22: qty 2

## 2016-08-22 NOTE — ED Triage Notes (Signed)
Patient says he feels like he did when he had a stroke,  says headache, slurred speech, tired.  Family is present with patient.  Patient has had a stroke.  Patient says he has felt this way for 4 days.  Notified provider staff.  Ander SladeBill oxford, np spoke to patient.  Patient going to ed.  Encouraged going to [place] ed over other locations for the stroke center resources.

## 2016-08-22 NOTE — ED Triage Notes (Signed)
Pt complains of a severe headache today, he states that he was slurring his speech when he got to work this am but that has subsided, pt has no other complaints

## 2016-08-22 NOTE — ED Provider Notes (Signed)
[facility] Provider Note   CSN: [id number] Arrival date & time: 08/22/16  1924     History   Chief Complaint Chief Complaint  Patient presents with  . Headache    HPI Cordelia PocheJeffrey L Helgeson is a 36 y.o. male.  Patient is a 36 year old male with a history of hypertension and hyperlipidemia who presents with slurred speech and left-sided weakness. He also states he has a history of a prior stroke per his report. On review of records, he was admitted to [facility] in 2012 with a hypertensive emergency. He was discharged with a diagnosis of a possible TIA. He states that for the last week he's had a worsening headache. He stated it started gradually like a typical migraine type headache and has gotten worse over the week. It's a bifrontal-type headache but he wakes up in the morning with pain in the back of his head. There is no neck pain. No fevers. It's worse than his typical migrainous type headaches. His had no nausea or vomiting. He states that over the last week he's noticed some drooling to the corner of the left mouth. This morning he started having some slurred speech this happen once he got to work this morning he did not wake up with it. He also was noted for the last 3-4 days some weakness in his left arm. He denies any leg involvement. No numbness to the face. No numbness to the extremities. No vision changes. He has had a history of Bell's palsy on the left but he states his symptoms had completely resolved and was back to baseline.      Past Medical History:  Diagnosis Date  . Hypercholesteremia   . Hypertension   . Stroke (HCC)   . Stroke [facility](HCC) 2012    Patient Active Problem List   Diagnosis Date Noted  . Hypertension 06/02/2015  . Headache 06/02/2015    Past Surgical History:  Procedure Laterality Date  . ORIF RADIUS & ULNA FRACTURES    . ORIF TIBIA & FIBULA FRACTURES    . ORTHOPEDIC SURGERY         Home Medications    Prior to Admission  medications   Medication Sig Start Date End Date Taking? Authorizing Provider  ibuprofen (ADVIL,MOTRIN) 200 MG tablet Take 1,200 mg by mouth daily as needed for moderate pain.   Yes [provider]  lisinopril-hydrochlorothiazide (PRINZIDE,ZESTORETIC) 20-25 MG tablet Take 1 tablet by mouth daily. 07/04/16  Yes Eustace MooreMurray, Laura W, MD  potassium chloride (K-DUR) 10 MEQ tablet Take 1 tablet (10 mEq total) by mouth daily. 07/08/16  Yes Eustace MooreMurray, Laura W, MD  HYDROcodone-acetaminophen (NORCO/VICODIN) 5-325 MG tablet Take 1 tablet by mouth 4 (four) times daily as needed. Patient not taking: Reported on 08/22/2016 07/04/16   Eustace MooreMurray, Laura W, MD    Family History Family History  Problem Relation Age of Onset  . Hypertension Mother   . Hypertension Father     Social History Social History  Substance Use Topics  . Smoking status: Light Tobacco Smoker    Types: Cigars  . Smokeless tobacco: Never Used  . Alcohol use No     Comment: socially     Allergies   Tea   Review of Systems Review of Systems  Constitutional: Negative for chills, diaphoresis, fatigue and fever.  HENT: Negative for congestion, rhinorrhea and sneezing.   Eyes: Negative.   Respiratory: Negative for cough, chest tightness and shortness of breath.   Cardiovascular: Negative for chest pain and  leg swelling.  Gastrointestinal: Negative for abdominal pain, blood in stool, diarrhea, nausea and vomiting.  Genitourinary: Negative for difficulty urinating, flank pain, frequency and hematuria.  Musculoskeletal: Negative for arthralgias and back pain.  Skin: Negative for rash.  Neurological: Positive for speech difficulty, weakness and headaches. Negative for dizziness and numbness.     Physical Exam Updated Vital Signs BP (!) 134/93 (BP Location: Right Arm)   Pulse 81   Temp 98.1 F (36.7 C)   Resp 18   SpO2 98%   Physical Exam  Constitutional: He is oriented to person, place, and time. He appears well-developed  and well-nourished.  HENT:  Head: Normocephalic and atraumatic.  Eyes: Pupils are equal, round, and reactive to light.  Neck: Normal range of motion. Neck supple.  Cardiovascular: Normal rate, regular rhythm and normal heart sounds.   Pulmonary/Chest: Effort normal and breath sounds normal. No respiratory distress. He has no wheezes. He has no rales. He exhibits no tenderness.  Abdominal: Soft. Bowel sounds are normal. There is no tenderness. There is no rebound and no guarding.  Musculoskeletal: Normal range of motion. He exhibits no edema.  Lymphadenopathy:    He has no cervical adenopathy.  Neurological: He is alert and oriented to person, place, and time.  Motor 5 out of 5 all extremities, sensation grossly intact to light touch all extremities, no pronator drift, there is some slight drooping of the corner of the left mouth. No sensory deficits to the face. Eye movements are intact. No visual field deficits. Normal shoulder shrug. Normal tongue protrusion. Finger to nose is intact.  Skin: Skin is warm and dry. No rash noted.  Psychiatric: He has a normal mood and affect.     ED Treatments / Results  Labs (all labs ordered are listed, but only abnormal results are displayed) Labs Reviewed  COMPREHENSIVE METABOLIC PANEL - Abnormal; Notable for the following:       Result Value   Potassium 3.3 (*)    Glucose, Bld 112 (*)    Calcium 8.8 (*)    AST 48 (*)    All other components within normal limits  I-STAT CHEM 8, ED - Abnormal; Notable for the following:    Potassium 3.2 (*)    Glucose, Bld 109 (*)    Calcium, Ion 1.13 (*)    All other components within normal limits  ETHANOL  PROTIME-INR  APTT  CBC  DIFFERENTIAL  RAPID URINE DRUG SCREEN, HOSP PERFORMED  URINALYSIS, ROUTINE W REFLEX MICROSCOPIC  I-STAT TROPOININ, ED    EKG  EKG Interpretation  Date/Time:  Tuesday August 22 2016 21:52:11 EDT Ventricular Rate:  81 PR Interval:    QRS Duration: 81 QT  Interval:  349 QTC Calculation: 406 R Axis:   57 Text Interpretation:  Sinus rhythm Borderline T abnormalities, diffuse leads since last tracing no significant change Confirmed by Rolan Bucco [phone]) on 08/22/2016 11:57:47 PM       Radiology Ct Head Wo Contrast  Result Date: 08/22/2016 CLINICAL DATA:  Headache and slurred speech.  Symptoms for 4 days. EXAM: CT HEAD WITHOUT CONTRAST TECHNIQUE: Contiguous axial images were obtained from the base of the skull through the vertex without intravenous contrast. COMPARISON:  Head CT 05/27/2015 FINDINGS: Brain: No evidence of acute infarction, hemorrhage, hydrocephalus, extra-axial collection or mass lesion/mass effect. Vascular: No hyperdense vessel or unexpected calcification. Skull: Normal. Negative for fracture or focal lesion. Sinuses/Orbits: Paranasal sinuses and mastoid air cells are clear. The visualized orbits are unremarkable. Other: None.  IMPRESSION: No acute intracranial abnormality. Electronically Signed   By: Rubye Oaks M.D.   On: 08/22/2016 22:45    Procedures Procedures (including critical care time)  Medications Ordered in ED Medications  morphine 4 MG/ML injection 8 mg (8 mg Intravenous Given 08/22/16 2249)  metoCLOPramide (REGLAN) injection 10 mg (10 mg Intravenous Given 08/22/16 2249)     Initial Impression / Assessment and Plan / ED Course  I have reviewed the triage vital signs and the nursing notes.  Pertinent labs & imaging results that were available during my care of the patient were reviewed by me and considered in my medical decision making (see chart for details).     Patient's headache has improved after treatment in the ED. His head CT is negative. His labs are non-concerning. He still reports that his left arm feels a little bit weaker although I don't appreciate any significant difference on exam. I spoke with Dr. Amada Jupiter with neurology who feels that patient can appropriately be discharged home if he  has a negative MRI.  We are unable to obtain an MRI at [facility] this time at night. I will transfer the patient to the emergency department at [facility]. He will need an MRI of the brain. If negative, he can be discharged home. I spoke Dr. Preston Fleeting who has accepted patient for transfer.  Final Clinical Impressions(s) / ED Diagnoses   Final diagnoses:  Migraine without aura and without status migrainosus, not intractable  Left-sided weakness    New Prescriptions New Prescriptions   No medications on file     Rolan Bucco, MD 08/22/16 2359

## 2016-08-23 ENCOUNTER — Emergency Department (HOSPITAL_COMMUNITY): Payer: Self-pay

## 2016-08-23 LAB — URINALYSIS, ROUTINE W REFLEX MICROSCOPIC
Bacteria, UA: NONE SEEN
Bilirubin Urine: NEGATIVE
GLUCOSE, UA: NEGATIVE mg/dL
HGB URINE DIPSTICK: NEGATIVE
KETONES UR: NEGATIVE mg/dL
NITRITE: NEGATIVE
Protein, ur: 30 mg/dL — AB
Specific Gravity, Urine: 1.027 (ref 1.005–1.030)
pH: 5 (ref 5.0–8.0)

## 2016-08-23 LAB — RAPID URINE DRUG SCREEN, HOSP PERFORMED
Amphetamines: NOT DETECTED
BARBITURATES: NOT DETECTED
BENZODIAZEPINES: NOT DETECTED
Cocaine: NOT DETECTED
Opiates: POSITIVE — AB
Tetrahydrocannabinol: POSITIVE — AB

## 2016-08-23 MED ORDER — MORPHINE SULFATE (PF) 4 MG/ML IV SOLN
8.0000 mg | Freq: Once | INTRAVENOUS | Status: AC
  Administered 2016-08-23: 8 mg via INTRAVENOUS
  Filled 2016-08-23: qty 2

## 2016-08-23 MED ORDER — KETOROLAC TROMETHAMINE 15 MG/ML IJ SOLN
15.0000 mg | Freq: Once | INTRAMUSCULAR | Status: AC
  Administered 2016-08-23: 15 mg via INTRAVENOUS
  Filled 2016-08-23: qty 1

## 2016-08-23 MED ORDER — IBUPROFEN 600 MG PO TABS: 600.0000 mg | ORAL_TABLET | Freq: Four times a day (QID) | ORAL | 0 refills | Status: DC | PRN

## 2016-08-23 NOTE — Discharge Instructions (Signed)
°  All the results in the ER are normal, labs and imaging.  The workup in the ER is not complete, and is limited to screening for life threatening and emergent conditions only, so please see a Neurologist for further evaluation.  Please return to the ER if the headache gets severe and in not improving, you have associated new one sided numbness, tingling, weakness or confusion, seizures, poor balance or poor vision.

## 2016-08-23 NOTE — ED Provider Notes (Signed)
Pt transferred for MRI brain for unilateral weakness and headaches. MRI is neg. Results from the ER workup discussed with the patient face to face and all questions answered to the best of my ability.    Derwood KaplanNanavati, Erle Guster, MD 08/23/16 [phone]

## 2016-08-23 NOTE — ED Notes (Signed)
Patient transported to MRI 

## 2016-08-23 NOTE — ED Notes (Signed)
Per [facility] and Stroke Swallow Eval performed by Randolm IdolAti, [facility], pt passed stroke swallow screen. Pt given coca cola and ice water to drink, pt able to drink without difficulty. Upon further assessment, pt answered "History of dysphagia" question with "yes". Pt at MRI at this time, unable to confirm history of dysphagia with patient. Tresa EndoKelly, RN aware. Will clarify upon pt return.

## 2016-08-23 NOTE — ED Notes (Signed)
Returned from MRI 

## 2016-08-23 NOTE — ED Notes (Signed)
[facility]. Pt with hx of TIA in 2012, HTN, and HLD. Pt with HA x 1 week which has progressively worsened. Per [facility], pt reported he had slurred speech this AM which resolved while pt at work. Pt also reports mild L sided weakness. NIH score 0, swallow screen passed. Pt A&Ox4, ambulatory with steady gait to bed. Denies pain at this time. 8 mg morphine and 10 mg of reglan [facility]. Per [facility], Dr. Amada JupiterKirkpatrick feels pt is appropriate to be d/c if pt MRI is negative.

## 2016-09-05 ENCOUNTER — Ambulatory Visit (HOSPITAL_COMMUNITY)
Admission: EM | Admit: 2016-09-05 | Discharge: 2016-09-05 | Disposition: A | Discharge status: Home / Self Care | Payer: Self-pay | Attending: Emergency Medicine | Admitting: Emergency Medicine

## 2016-09-05 ENCOUNTER — Encounter (HOSPITAL_COMMUNITY): Payer: Self-pay

## 2016-09-05 DIAGNOSIS — K047 Periapical abscess without sinus: Secondary | ICD-10-CM

## 2016-09-05 MED ORDER — CLINDAMYCIN HCL 300 MG PO CAPS: 300.0000 mg | ORAL_CAPSULE | Freq: Three times a day (TID) | ORAL | 0 refills | Status: DC

## 2016-09-05 MED ORDER — OXYCODONE-ACETAMINOPHEN 5-325 MG PO TABS: 1.0000 | ORAL_TABLET | ORAL | 0 refills | Status: DC | PRN

## 2016-09-05 NOTE — Discharge Instructions (Signed)
For your infection, I have prescribed an antibiotic called Clindamycin. Take one tablet three times a day. I have also prescribed a medicine for pain called Percocet, this medicine is a narcotic, it will cause drowsiness, and it is addictive. Do not take more than what is necessary, do not drink alcohol while taking, and do not operate any heavy machinery while taking this medicine. You will need to follow up with a dentist or your symptoms will reoccur

## 2016-09-05 NOTE — ED Provider Notes (Signed)
CSN: [id number]     Arrival date & time 09/05/16  1926 History   None    Chief Complaint  Patient presents with  . Facial Swelling   (Consider location/radiation/quality/duration/timing/severity/associated sxs/prior Treatment)  Dental Pain  Location:  Upper Upper teeth location:  4/RU 2nd bicuspid Quality:  Aching Severity:  Severe Onset quality:  Gradual Duration:  1 day Timing:  Constant Progression:  Worsening Chronicity:  New Context: cap fell off and poor dentition   Relieved by:  None tried Worsened by:  Cold food/drink, hot food/drink, touching and pressure Associated symptoms: facial pain, facial swelling and gum swelling   Associated symptoms: no congestion, no fever, no oral bleeding, no oral lesions and no trismus   Risk factors: lack of dental care     Past Medical History:  Diagnosis Date  . Hypercholesteremia   . Hypertension   . Stroke (HCC)   . Stroke [facility]) 2012   Past Surgical History:  Procedure Laterality Date  . ORIF RADIUS & ULNA FRACTURES    . ORIF TIBIA & FIBULA FRACTURES    . ORTHOPEDIC SURGERY     Family History  Problem Relation Age of Onset  . Hypertension Mother   . Hypertension Father    Social History  Substance Use Topics  . Smoking status: Light Tobacco Smoker    Types: Cigars  . Smokeless tobacco: Never Used  . Alcohol use No     Comment: socially    Review of Systems  Constitutional: Negative for chills and fever.  HENT: Positive for facial swelling. Negative for congestion and mouth sores.   Respiratory: Negative.   Cardiovascular: Negative.   Gastrointestinal: Negative.   Musculoskeletal: Negative.   Skin: Negative.   Neurological: Negative.     Allergies  Tea  Home Medications   Prior to Admission medications   Medication Sig Start Date End Date Taking? Authorizing Provider  lisinopril-hydrochlorothiazide (PRINZIDE,ZESTORETIC) 20-25 MG tablet Take 1 tablet by mouth daily. 07/04/16  Yes Eustace Moore, MD    potassium chloride (K-DUR) 10 MEQ tablet Take 1 tablet (10 mEq total) by mouth daily. 07/08/16  Yes Eustace Moore, MD  clindamycin (CLEOCIN) 300 MG capsule Take 1 capsule (300 mg total) by mouth 3 (three) times daily. 09/05/16   Dorena Bodo, NP  HYDROcodone-acetaminophen (NORCO/VICODIN) 5-325 MG tablet Take 1 tablet by mouth 4 (four) times daily as needed. Patient not taking: Reported on 08/22/2016 07/04/16   Eustace Moore, MD  ibuprofen (ADVIL,MOTRIN) 600 MG tablet Take 1 tablet (600 mg total) by mouth every 6 (six) hours as needed. 08/23/16   Derwood Kaplan, MD  oxyCODONE-acetaminophen (PERCOCET/ROXICET) 5-325 MG tablet Take 1 tablet by mouth every 4 (four) hours as needed for severe pain. 09/05/16   Dorena Bodo, NP   Meds Ordered and Administered this Visit  Medications - No data to display  BP (!) 148/90 (BP Location: Right Arm)   Pulse 92   Temp 99.3 F (37.4 C) (Oral)   Resp 18   SpO2 98%  No data found.   Physical Exam  Constitutional: He is oriented to person, place, and time. He appears well-developed and well-nourished. No distress.  HENT:  Head: Normocephalic and atraumatic.  Right Ear: External ear normal.  Left Ear: External ear normal.  Mouth/Throat: Oropharynx is clear and moist. Dental abscesses present.    Eyes: Conjunctivae are normal.  Neurological: He is alert and oriented to person, place, and time.  Skin: Skin is warm and dry. Capillary refill  takes less than 2 seconds. He is not diaphoretic.  Psychiatric: He has a normal mood and affect. His behavior is normal.  Nursing note and vitals reviewed.   Urgent Care Course     Procedures (including critical care time)  Labs Review Labs Reviewed - No data to display  Imaging Review No results found.     MDM   1. Dental abscess     Clindamycin for infection, Percocet for pain, unable to do hydrocodone due to allergy. Follow up with dentist for further care.    Dorena BodoKennard, Lenisha Lacap,  NP 09/05/16 2008

## 2016-09-05 NOTE — ED Triage Notes (Signed)
Pt Said his face has been swelling since 4pm. Said it's hard and warm to the touch. Hasn't happened before. Pt said nothing new added to his daily routine and said he had pizza for lunch with cheese on it. No meat and said he had water and mt dew to drink. York SpanielSaid it could possibly be his tooth but his mouth doesn't hurt. Said he pulled out a filling a few weeks back with candy and hadn't got it fixed yet. Said his face only hurts on the right side and that's also the same side were the filing was. Said his top gum is sore though, also ears are hurting as well.

## 2016-10-29 ENCOUNTER — Ambulatory Visit (HOSPITAL_COMMUNITY)
Admission: EM | Admit: 2016-10-29 | Discharge: 2016-10-29 | Disposition: A | Discharge status: Home / Self Care | Payer: Self-pay | Attending: Family Medicine | Admitting: Family Medicine

## 2016-10-29 ENCOUNTER — Encounter (HOSPITAL_COMMUNITY): Payer: Self-pay | Admitting: *Deleted

## 2016-10-29 DIAGNOSIS — K209 Esophagitis, unspecified without bleeding: Secondary | ICD-10-CM

## 2016-10-29 DIAGNOSIS — R0789 Other chest pain: Secondary | ICD-10-CM

## 2016-10-29 DIAGNOSIS — I10 Essential (primary) hypertension: Secondary | ICD-10-CM

## 2016-10-29 MED ORDER — GI COCKTAIL ~~LOC~~
30.0000 mL | Freq: Once | ORAL | Status: AC
  Administered 2016-10-29: 30 mL via ORAL

## 2016-10-29 MED ORDER — LISINOPRIL-HYDROCHLOROTHIAZIDE 20-25 MG PO TABS: 1.0000 | ORAL_TABLET | Freq: Every day | ORAL | 3 refills | Status: DC

## 2016-10-29 MED ORDER — GI COCKTAIL ~~LOC~~
ORAL | Status: AC
  Filled 2016-10-29: qty 30

## 2016-10-29 MED ORDER — ESOMEPRAZOLE MAGNESIUM 40 MG PO CPDR: 40.0000 mg | DELAYED_RELEASE_CAPSULE | Freq: Every day | ORAL | 0 refills | Status: DC

## 2016-10-29 NOTE — ED Provider Notes (Addendum)
[facility]    CSN: [id number] Arrival date & time: 10/29/16  1359     History   Chief Complaint Chief Complaint  Patient presents with  . Headache    HPI [name] [name] is a 36 y.o. male.   Pt  Reports  A  Tightness  In  His  Chest, worse with deep breath and sharp in character associated with a Headache      Pt   Reports     He  has not taken his Lisinopril today.   He be sitting  upright  on  exam table speaking in Complete sentences.     Patient had heartburn yesterday and took Tums which resolved the heartburn. The pain he has today is a little bit different in that it is substernal and persistent.  Patient has no leg pain, nausea, vomiting, shortness of breath, new leg swelling, or fever  Patient installs escalators.  He has not done anything new recently in the way of physical exertion      Past Medical History:  Diagnosis Date  . Hypercholesteremia   . Hypertension   . Stroke (HCC)   . Stroke [facility]) 2012    Patient Active Problem List   Diagnosis Date Noted  . Hypertension 06/02/2015  . Headache 06/02/2015    Past Surgical History:  Procedure Laterality Date  . ORIF RADIUS & ULNA FRACTURES    . ORIF TIBIA & FIBULA FRACTURES    . ORTHOPEDIC SURGERY         Home Medications    Prior to Admission medications   Medication Sig Start Date End Date Taking? Authorizing Provider  esomeprazole (NEXIUM) 40 MG capsule Take 1 capsule (40 mg total) by mouth daily. 10/29/16   Elvina Sidle, MD  lisinopril-hydrochlorothiazide (PRINZIDE,ZESTORETIC) 20-25 MG tablet Take 1 tablet by mouth daily. 10/29/16   Elvina Sidle, MD    Family History Family History  Problem Relation Age of Onset  . Hypertension Mother   . Hypertension Father     Social History Social History  Substance Use Topics  . Smoking status: Light Tobacco Smoker    Types: Cigars  . Smokeless tobacco: Never Used  . Alcohol use No     Comment: socially     Allergies    Tea   Review of Systems Review of Systems  Cardiovascular: Positive for chest pain.  All other systems reviewed and are negative.    Physical Exam Triage Vital Signs ED Triage Vitals  Enc Vitals Group     BP 10/29/16 1427 (!) 138/99     Pulse Rate 10/29/16 1427 76     Resp 10/29/16 1427 20     Temp 10/29/16 1427 98.6 F (37 C)     Temp Source 10/29/16 1427 Oral     SpO2 10/29/16 1427 100 %     Weight --      Height --      Head Circumference --      Peak Flow --      Pain Score 10/29/16 1425 8     Pain Loc --      Pain Edu? --      Excl. in GC? --    No data found.   Updated Vital Signs BP (!) 138/99 (BP Location: Right Arm)   Pulse 76   Temp 98.6 F (37 C) (Oral)   Resp 20   SpO2 100%       Physical Exam  Constitutional: He is  oriented to person, place, and time. He appears well-developed and well-nourished.  HENT:  Right Ear: External ear normal.  Left Ear: External ear normal.  Mouth/Throat: Oropharynx is clear and moist.  Eyes: Pupils are equal, round, and reactive to light. EOM are normal.  Neck: Normal range of motion. Neck supple.  Cardiovascular: Normal rate, regular rhythm and normal heart sounds.   Pulmonary/Chest: Effort normal and breath sounds normal.  No chest wall tenderness  Musculoskeletal: Normal range of motion.  Neurological: He is alert and oriented to person, place, and time.  Skin: Skin is warm and dry.  Nursing note and vitals reviewed.    UC Treatments / Results  Labs (all labs ordered are listed, but only abnormal results are displayed) Labs Reviewed - No data to display  EKG  EKG Interpretation None       Radiology No results found.  Procedures Procedures (including critical care time)  Medications Ordered in UC Medications  gi cocktail (Maalox,Lidocaine,Donnatal) (30 mLs Oral Given 10/29/16 1451)   Pain resolved after the gi cocktail  Initial Impression / Assessment and Plan / UC Course  I have reviewed  the triage vital signs and the nursing notes.  Pertinent labs & imaging results that were available during my care of the patient were reviewed by me and considered in my medical decision making (see chart for details).      Final Clinical Impressions(s) / UC Diagnoses   Final diagnoses:  Acute esophagitis  Essential hypertension    New Prescriptions New Prescriptions   ESOMEPRAZOLE (NEXIUM) 40 MG CAPSULE    Take 1 capsule (40 mg total) by mouth daily.   Lisinopril refilled  Controlled Substance Prescriptions [place] Controlled Substance Registry consulted? Not Applicable   Elvina Sidle, MD 10/29/16 1505    Elvina Sidle, MD 10/29/16 [phone]

## 2016-10-29 NOTE — ED Notes (Signed)
Pt  Requests   rx   [facility]  [facility]   On  [employer]     rx  For lisinopril  Phoned   To  Pharmacist

## 2016-10-29 NOTE — ED Triage Notes (Signed)
Pt  Reports  A  Tightness  In  His  Chest pain      And  A  Headache      Pt   Reports     He  Has    Not  Taken  His  Lisinopril    Today  He  Reports  A  Pain in  His  Chest  When he  Takes  A   Deep  Breath        He  Is   Sitting  Upright  On  Exam table  Speaking in  Complete   sentances
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
## 2017-07-28 ENCOUNTER — Other Ambulatory Visit: Payer: Self-pay

## 2017-07-28 ENCOUNTER — Ambulatory Visit (HOSPITAL_COMMUNITY)
Admission: EM | Admit: 2017-07-28 | Discharge: 2017-07-28 | Disposition: A | Discharge status: Home / Self Care | Payer: 59 | Attending: Family Medicine | Admitting: Family Medicine

## 2017-07-28 ENCOUNTER — Encounter (HOSPITAL_COMMUNITY): Payer: Self-pay | Admitting: *Deleted

## 2017-07-28 ENCOUNTER — Ambulatory Visit (INDEPENDENT_AMBULATORY_CARE_PROVIDER_SITE_OTHER): Payer: Self-pay

## 2017-07-28 DIAGNOSIS — Z8249 Family history of ischemic heart disease and other diseases of the circulatory system: Secondary | ICD-10-CM | POA: Insufficient documentation

## 2017-07-28 DIAGNOSIS — J029 Acute pharyngitis, unspecified: Secondary | ICD-10-CM

## 2017-07-28 DIAGNOSIS — F17290 Nicotine dependence, other tobacco product, uncomplicated: Secondary | ICD-10-CM | POA: Insufficient documentation

## 2017-07-28 DIAGNOSIS — E785 Hyperlipidemia, unspecified: Secondary | ICD-10-CM | POA: Insufficient documentation

## 2017-07-28 DIAGNOSIS — R0789 Other chest pain: Secondary | ICD-10-CM | POA: Insufficient documentation

## 2017-07-28 DIAGNOSIS — I10 Essential (primary) hypertension: Secondary | ICD-10-CM | POA: Insufficient documentation

## 2017-07-28 DIAGNOSIS — Z79899 Other long term (current) drug therapy: Secondary | ICD-10-CM | POA: Insufficient documentation

## 2017-07-28 DIAGNOSIS — R509 Fever, unspecified: Secondary | ICD-10-CM | POA: Insufficient documentation

## 2017-07-28 DIAGNOSIS — Z8673 Personal history of transient ischemic attack (TIA), and cerebral infarction without residual deficits: Secondary | ICD-10-CM | POA: Insufficient documentation

## 2017-07-28 LAB — POCT RAPID STREP A: Streptococcus, Group A Screen (Direct): NEGATIVE

## 2017-07-28 MED ORDER — GUAIFENESIN-CODEINE 100-10 MG/5ML PO SOLN
5.0000 mL | Freq: Every evening | ORAL | 0 refills | Status: DC | PRN
Start: 2017-07-28 — End: 2018-04-16

## 2017-07-28 MED ORDER — GI COCKTAIL ~~LOC~~
30.0000 mL | Freq: Once | ORAL | Status: AC
  Administered 2017-07-28: 30 mL via ORAL

## 2017-07-28 MED ORDER — GI COCKTAIL ~~LOC~~
ORAL | Status: AC
  Filled 2017-07-28: qty 30

## 2017-07-28 MED ORDER — ACETAMINOPHEN 325 MG PO TABS
650.0000 mg | ORAL_TABLET | Freq: Once | ORAL | Status: AC
  Administered 2017-07-28: 650 mg via ORAL

## 2017-07-28 MED ORDER — ACETAMINOPHEN 325 MG PO TABS
ORAL_TABLET | ORAL | Status: AC
  Filled 2017-07-28: qty 2

## 2017-07-28 MED ORDER — IBUPROFEN 800 MG PO TABS: 800.0000 mg | ORAL_TABLET | Freq: Three times a day (TID) | ORAL | 0 refills | Status: DC

## 2017-07-28 MED ORDER — CEFDINIR 300 MG PO CAPS: 300.0000 mg | ORAL_CAPSULE | Freq: Two times a day (BID) | ORAL | 0 refills | Status: AC

## 2017-07-28 NOTE — Discharge Instructions (Signed)
Please begin Omnicef Please use cough syrup at bedtime to help with cough, Delsym or Robitussin during the day Sore Throat  Your rapid strep tested Negative today. We will send for a culture and call in about 2 days if results are positive. For now we will treat your sore throat as a virus with symptom management.   Please continue Tylenol or Ibuprofen for fever and pain. May try salt water gargles, cepacol lozenges, throat spray, or OTC cold relief medicine for throat discomfort. If you also have congestion take a daily anti-histamine like Zyrtec, Claritin, and a oral decongestant to help with post nasal drip that may be irritating your throat.   Stay hydrated and drink plenty of fluids to keep your throat coated relieve irritation.

## 2017-07-28 NOTE — ED Triage Notes (Signed)
C/O starting with cough last night.  This AM woke up with sore throat, HA, chest discomfort (states worse with deep breathing), hot/cold flashes, dizziness, fatigue.

## 2017-07-28 NOTE — ED Provider Notes (Signed)
[facility]    CSN: [id number] Arrival date & time: 07/28/17  1247     History   Chief Complaint Chief Complaint  Patient presents with  . Fever  . Chest Pain    HPI [name] [name] is a 37 y.o. male history of hypertension, hyperlipidemia presenting today for evaluation of sore throat, headache, chest discomfort.  He states that symptoms began acutely last night, sore throat is his main complaint is he is having pain with swallowing.  Having significant discomfort with deep breathing.  Also feeling very fatigued and weak.  Does endorse having diarrhea, states he has had approximately 10 episodes today.  Diarrhea is watery.  Denies blood in diarrhea.  Is not taking anything for his symptoms.  HPI  Past Medical History:  Diagnosis Date  . Hypercholesteremia   . Hypertension   . Stroke (HCC)   . Stroke [facility]) 2012    Patient Active Problem List   Diagnosis Date Noted  . Hypertension 06/02/2015  . Headache 06/02/2015    Past Surgical History:  Procedure Laterality Date  . FOOT SURGERY    . KNEE SURGERY    . ORIF RADIUS & ULNA FRACTURES    . ORIF TIBIA & FIBULA FRACTURES    . ORTHOPEDIC SURGERY    . SHOULDER SURGERY         Home Medications    Prior to Admission medications   Medication Sig Start Date End Date Taking? Authorizing Provider  Ascorbic Acid (VITAMIN C PO) Take by mouth.   Yes [provider]  lisinopril-hydrochlorothiazide (PRINZIDE,ZESTORETIC) 20-25 MG tablet Take 1 tablet by mouth daily. 10/29/16  Yes Elvina Sidle, MD  cefdinir (OMNICEF) 300 MG capsule Take 1 capsule (300 mg total) by mouth 2 (two) times daily for 7 days. 07/28/17 08/04/17  Wieters, Hallie C, PA-C  esomeprazole (NEXIUM) 40 MG capsule Take 1 capsule (40 mg total) by mouth daily. 10/29/16   Elvina Sidle, MD  guaiFENesin-codeine 100-10 MG/5ML syrup Take 5 mLs by mouth at bedtime as needed for cough. 07/28/17   Wieters, Hallie C, PA-C  ibuprofen (ADVIL,MOTRIN)  800 MG tablet Take 1 tablet (800 mg total) by mouth 3 (three) times daily. 07/28/17   Wieters, Junius Creamer, PA-C    Family History Family History  Problem Relation Age of Onset  . Hypertension Mother   . Hypertension Father     Social History Social History   Tobacco Use  . Smoking status: Light Tobacco Smoker    Types: Cigars  . Smokeless tobacco: Never Used  Substance Use Topics  . Alcohol use: Yes    Comment: occasionally  . Drug use: No     Allergies   Tea   Review of Systems Review of Systems  Constitutional: Positive for fatigue. Negative for activity change, appetite change and fever.  HENT: Positive for congestion, postnasal drip, rhinorrhea and sore throat. Negative for ear pain and sinus pressure.   Eyes: Negative for pain and itching.  Respiratory: Positive for cough. Negative for shortness of breath.   Cardiovascular: Negative for chest pain.  Gastrointestinal: Positive for diarrhea. Negative for abdominal pain, nausea and vomiting.  Musculoskeletal: Negative for myalgias.  Skin: Negative for rash.  Neurological: Positive for dizziness, weakness and light-headedness. Negative for headaches.     Physical Exam Triage Vital Signs ED Triage Vitals [07/28/17 1255]  Enc Vitals Group     BP (!) 142/70     Pulse Rate 92     Resp 20  Temp (!) 102.2 F (39 C)     Temp Source Temporal     SpO2 96 %     Weight      Height      Head Circumference      Peak Flow      Pain Score 10     Pain Loc      Pain Edu?      Excl. in GC?    No data found.  Updated Vital Signs BP (!) 142/70   Pulse 92   Temp (!) 102.2 F (39 C) (Temporal)   Resp 20   SpO2 96%  Temperature rechecked, 101.7 Visual Acuity Right Eye Distance:   Left Eye Distance:   Bilateral Distance:    Right Eye Near:   Left Eye Near:    Bilateral Near:     Physical Exam  Constitutional: He appears well-developed and well-nourished.  HENT:  Head: Normocephalic and atraumatic.    Bilateral ears without tenderness to palpation of external auricle, tragus and mastoid, EAC's without erythema or swelling, TM's with good bony landmarks and cone of light. Non erythematous.  Bilateral tonsils appeared moderately enlarged and erythematous, no exudate.  Uvula swelling or deviation.  Posterior oropharynx patent.  Eyes: Conjunctivae are normal.  Neck: Neck supple.  Cardiovascular: Normal rate and regular rhythm.  No murmur heard. Pulmonary/Chest: Effort normal and breath sounds normal. No respiratory distress.  Patient resistant take a deep breath, breath sounds sound decreased throughout  Chest tender to palpation  Abdominal: Soft. There is no tenderness.  Musculoskeletal: He exhibits no edema.  Neurological: He is alert.  Skin: Skin is warm and dry.  Psychiatric: He has a normal mood and affect.  Nursing note and vitals reviewed.    UC Treatments / Results  Labs (all labs ordered are listed, but only abnormal results are displayed) Labs Reviewed  CULTURE, GROUP A STREP [facility])  POCT RAPID STREP A    EKG None  Radiology Dg Chest 2 View  Result Date: 07/28/2017 CLINICAL DATA:  Cough, fever EXAM: CHEST - 2 VIEW COMPARISON:  08/27/2015 FINDINGS: Low lung volumes. There is cardiomegaly with mild vascular congestion and bibasilar atelectasis. No effusions or acute bony abnormality. IMPRESSION: Low lung volumes with bibasilar atelectasis. Cardiomegaly, vascular congestion. Electronically Signed   By: Charlett Nose M.D.   On: 07/28/2017 14:30    Procedures Procedures (including critical care time)  Medications Ordered in UC Medications  acetaminophen (TYLENOL) tablet 650 mg (650 mg Oral Given 07/28/17 1304)  gi cocktail (Maalox,Lidocaine,Donnatal) (30 mLs Oral Given 07/28/17 1437)    Initial Impression / Assessment and Plan / UC Course  I have reviewed the triage vital signs and the nursing notes.  Pertinent labs & imaging results that were available during my  care of the patient were reviewed by me and considered in my medical decision making (see chart for details).     Strep test negative.  Chest x-ray negative although low lung volumes seen.  Given appearance of throat as well as document fever.  We will go ahead and initiate treatment with Omnicef.  Provided GI cocktail to help with her pain, this provided him some relief.  I will continue Tylenol and ibuprofen for fever and pain.  Discussed other over-the-counter measures to provide.  Also provided Robitussin codeine for cough.Discussed strict return precautions. Patient verbalized understanding and is agreeable with plan.  Final Clinical Impressions(s) / UC Diagnoses   Final diagnoses:  Sore throat  Discharge Instructions     Please begin Omnicef Please use cough syrup at bedtime to help with cough, Delsym or Robitussin during the day Sore Throat  Your rapid strep tested Negative today. We will send for a culture and call in about 2 days if results are positive. For now we will treat your sore throat as a virus with symptom management.   Please continue Tylenol or Ibuprofen for fever and pain. May try salt water gargles, cepacol lozenges, throat spray, or OTC cold relief medicine for throat discomfort. If you also have congestion take a daily anti-histamine like Zyrtec, Claritin, and a oral decongestant to help with post nasal drip that may be irritating your throat.   Stay hydrated and drink plenty of fluids to keep your throat coated relieve irritation.    ED Prescriptions    Medication Sig Dispense Auth. Provider   cefdinir (OMNICEF) 300 MG capsule Take 1 capsule (300 mg total) by mouth 2 (two) times daily for 7 days. 14 capsule Wieters, Hallie C, PA-C   guaiFENesin-codeine 100-10 MG/5ML syrup Take 5 mLs by mouth at bedtime as needed for cough. 100 mL Wieters, Hallie C, PA-C   ibuprofen (ADVIL,MOTRIN) 800 MG tablet Take 1 tablet (800 mg total) by mouth 3 (three) times daily. 21  tablet Wieters, [place]itsettHallie C, PA-C     Controlled Substance Prescriptions [place] Controlled Substance Registry consulted? Not Applicable   Lew DawesWieters, Hallie C, [place]-C 07/29/17 1205

## 2017-07-31 LAB — CULTURE, GROUP A STREP (THRC)

## 2018-03-27 ENCOUNTER — Other Ambulatory Visit: Payer: Self-pay | Admitting: Internal Medicine

## 2018-03-27 NOTE — Telephone Encounter (Signed)
New Message   1) Medication(s) Requested (by name): lisinopril-hydrochlorothiazide (PRINZIDE,ZESTORETIC) 20-25 MG tablet  2) Pharmacy of Choice: [facility]  3) Special Requests: Pt was last seen 05/2015 scheduled appt for 3/3 and pt wants to know if he can gt a refill to last him until his appt   Approved medications will be sent to the pharmacy, we will reach out if there is an issue.  Requests made after 3pm may not be addressed until the following business day!  If a patient is unsure of the name of the medication(s) please note and ask patient to call back when they are able to provide all info, do not send to responsible party until all information is available!

## 2018-04-02 ENCOUNTER — Ambulatory Visit (HOSPITAL_COMMUNITY)
Admission: EM | Admit: 2018-04-02 | Discharge: 2018-04-02 | Disposition: A | Discharge status: Home / Self Care | Payer: 59 | Attending: Family Medicine | Admitting: Family Medicine

## 2018-04-02 ENCOUNTER — Encounter (HOSPITAL_COMMUNITY): Payer: Self-pay | Admitting: Emergency Medicine

## 2018-04-02 DIAGNOSIS — Z760 Encounter for issue of repeat prescription: Secondary | ICD-10-CM | POA: Diagnosis not present

## 2018-04-02 MED ORDER — LISINOPRIL-HYDROCHLOROTHIAZIDE 20-25 MG PO TABS: 1.0000 | ORAL_TABLET | Freq: Every day | ORAL | 0 refills | Status: DC

## 2018-04-02 NOTE — ED Triage Notes (Signed)
Pt states he ran out of his bp meds x1, has appt with pcp on march 3rd. Needs medicines until then.

## 2018-04-02 NOTE — Discharge Instructions (Signed)
30 day supply provided. Follow up with PCP as scheduled for further management needed.

## 2018-04-02 NOTE — ED Provider Notes (Signed)
[facility]    CSN: [id number] Arrival date & time: 04/02/18  1622     History   Chief Complaint Chief Complaint  Patient presents with  . Medication Refill    HPI [name] [name] is a 38 y.o. male.   38 year old male comes in for refill of hypertension medicine. States has been out for 1 month. States he has been trying to reach his PCP for refill and had been unsuccessful until today. States was told he needed to be seen given his last visit was 1 year ago. He has an appointment for 3/3, but was not able to get the refill and therefore came in for evaluation. States he has a headache today, but improving after BC powder. He denies one sided weakness, dizziness, blurry vision. Denies chest pain, shortness of breath, palpitations. Has been on lisinopril-HCTZ for years without problems.      Past Medical History:  Diagnosis Date  . Hypercholesteremia   . Hypertension   . Stroke (HCC)   . Stroke [facility]) 2012    Patient Active Problem List   Diagnosis Date Noted  . Hypertension 06/02/2015  . Headache 06/02/2015    Past Surgical History:  Procedure Laterality Date  . FOOT SURGERY    . KNEE SURGERY    . ORIF RADIUS & ULNA FRACTURES    . ORIF TIBIA & FIBULA FRACTURES    . ORTHOPEDIC SURGERY    . SHOULDER SURGERY         Home Medications    Prior to Admission medications   Medication Sig Start Date End Date Taking? Authorizing Provider  Ascorbic Acid (VITAMIN C PO) Take by mouth.    [provider]  esomeprazole (NEXIUM) 40 MG capsule Take 1 capsule (40 mg total) by mouth daily. 10/29/16   Elvina Sidle, MD  guaiFENesin-codeine 100-10 MG/5ML syrup Take 5 mLs by mouth at bedtime as needed for cough. 07/28/17   Wieters, Hallie C, PA-C  ibuprofen (ADVIL,MOTRIN) 800 MG tablet Take 1 tablet (800 mg total) by mouth 3 (three) times daily. 07/28/17   Wieters, Hallie C, PA-C  lisinopril-hydrochlorothiazide (PRINZIDE,ZESTORETIC) 20-25 MG tablet Take 1  tablet by mouth daily. 04/02/18   Belinda Fisher, PA-C    Family History Family History  Problem Relation Age of Onset  . Hypertension Mother   . Hypertension Father     Social History Social History   Tobacco Use  . Smoking status: Light Tobacco Smoker    Types: Cigars  . Smokeless tobacco: Never Used  Substance Use Topics  . Alcohol use: Yes    Comment: occasionally  . Drug use: No     Allergies   Tea   Review of Systems Review of Systems  Reason unable to perform ROS: See HPI as above.     Physical Exam Triage Vital Signs ED Triage Vitals [04/02/18 1654]  Enc Vitals Group     BP (!) 160/100     Pulse Rate 85     Resp 18     Temp 98.2 F (36.8 C)     Temp src      SpO2 100 %     Weight      Height      Head Circumference      Peak Flow      Pain Score 0     Pain Loc      Pain Edu?      Excl. in GC?    No  data found.  Updated Vital Signs BP (!) 160/100   Pulse 85   Temp 98.2 F (36.8 C)   Resp 18   SpO2 100%   Visual Acuity Right Eye Distance:   Left Eye Distance:   Bilateral Distance:    Right Eye Near:   Left Eye Near:    Bilateral Near:     Physical Exam Constitutional:      General: He is not in acute distress.    Appearance: He is well-developed. He is not ill-appearing, toxic-appearing or diaphoretic.  HENT:     Head: Normocephalic and atraumatic.     Mouth/Throat:     Mouth: Mucous membranes are moist.     Pharynx: Oropharynx is clear.  Eyes:     Extraocular Movements: Extraocular movements intact.     Conjunctiva/sclera: Conjunctivae normal.     Pupils: Pupils are equal, round, and reactive to light.  Neck:     Musculoskeletal: Normal range of motion and neck supple.  Cardiovascular:     Rate and Rhythm: Normal rate and regular rhythm.     Heart sounds: No murmur. No friction rub. No gallop.   Pulmonary:     Effort: Pulmonary effort is normal. No respiratory distress.     Breath sounds: Normal breath sounds. No stridor.  No wheezing, rhonchi or rales.  Neurological:     General: No focal deficit present.     Mental Status: He is alert and oriented to person, place, and time.     GCS: GCS eye subscore is 4. GCS verbal subscore is 5. GCS motor subscore is 6.     Cranial Nerves: Cranial nerves are intact.     Sensory: Sensation is intact.     Motor: Motor function is intact.     Coordination: Coordination is intact.     Gait: Gait is intact.      UC Treatments / Results  Labs (all labs ordered are listed, but only abnormal results are displayed) Labs Reviewed - No data to display  EKG None  Radiology No results found.  Procedures Procedures (including critical care time)  Medications Ordered in UC Medications - No data to display  Initial Impression / Assessment and Plan / UC Course  I have reviewed the triage vital signs and the nursing notes.  Pertinent labs & imaging results that were available during my care of the patient were reviewed by me and considered in my medical decision making (see chart for details).    Lisinopril-HCTZ refilled for 30 days. Patient to follow up with PCP as scheduled for further evaluation and management needed. Return precautions given. Patient expresses understanding and agrees to plan.  Final Clinical Impressions(s) / UC Diagnoses   Final diagnoses:  Medication refill    ED Prescriptions    Medication Sig Dispense Auth. Provider   lisinopril-hydrochlorothiazide (PRINZIDE,ZESTORETIC) 20-25 MG tablet Take 1 tablet by mouth daily. 30 tablet Threasa Alpha, [place] 04/02/18 1715

## 2018-04-16 ENCOUNTER — Encounter: Payer: Self-pay | Admitting: Internal Medicine

## 2018-04-16 ENCOUNTER — Ambulatory Visit: Payer: 59 | Attending: Internal Medicine | Admitting: Internal Medicine

## 2018-04-16 VITALS — BP 154/100 | HR 77 | Temp 97.8°F | Resp 16 | Ht 76.0 in | Wt >= 6400 oz

## 2018-04-16 DIAGNOSIS — I10 Essential (primary) hypertension: Secondary | ICD-10-CM | POA: Diagnosis not present

## 2018-04-16 DIAGNOSIS — Z6841 Body Mass Index (BMI) 40.0 and over, adult: Secondary | ICD-10-CM

## 2018-04-16 DIAGNOSIS — Z720 Tobacco use: Secondary | ICD-10-CM | POA: Diagnosis not present

## 2018-04-16 DIAGNOSIS — E66813 Obesity, class 3: Secondary | ICD-10-CM | POA: Insufficient documentation

## 2018-04-16 MED ORDER — LISINOPRIL-HYDROCHLOROTHIAZIDE 20-25 MG PO TABS: 1.0000 | ORAL_TABLET | Freq: Every day | ORAL | 6 refills | Status: DC

## 2018-04-16 MED ORDER — AMLODIPINE BESYLATE 5 MG PO TABS: 5.0000 mg | ORAL_TABLET | Freq: Every day | ORAL | 6 refills | Status: DC

## 2018-04-16 NOTE — Patient Instructions (Signed)
Please give patient an appointment with the clinical pharmacist in 2 weeks for repeat blood pressure check.  Your blood pressure is not well controlled.  The goal is 130/80 or lower.  Continue lisinopril/ HCTZ.  We have added a new blood pressure medication called amlodipine 5 mg.  You will take 1 tablet daily.  Continue to limit salt in the foods.  Follow a Healthy Eating Plan - You can do it! Limit sugary drinks.  Avoid sodas, sweet tea, sport or energy drinks, or fruit drinks.  Drink water, lo-fat milk, or diet drinks. Limit snack foods.   Cut back on candy, cake, cookies, chips, ice cream.  These are a special treat, only in small amounts. Eat plenty of vegetables.  Especially dark green, red, and orange vegetables. Aim for at least 3 servings a day. More is better! Include fruit in your daily diet.  Whole fruit is much healthier than fruit juice! Limit "white" bread, "white" pasta, "white" rice.   Choose "100% whole grain" products, brown or wild rice. Avoid fatty meats. Try "Meatless Monday" and choose eggs or beans one day a week.  When eating meat, choose lean meats like chicken, [place], and fish.  Grill, broil, or bake meats instead of frying, and eat poultry without the skin. Eat less salt.  Avoid frozen pizzas, frozen dinners and salty foods.  Use seasonings other than salt in cooking.  This can help blood pressure and keep you from swelling Beer, wine and liquor have calories.  If you can safely drink alcohol, limit to 1 drink per day for women, 2 drinks for men

## 2018-04-16 NOTE — Progress Notes (Signed)
Patient ID: [name] [name], male    DOB: [birth year]  MRN: [id number]  CC: New Patient (Initial Visit) and Hypertension   Subjective: [name] [name] is a 38 y.o. male who presents to re-est care.  Last seen 2017 His concerns today include:  Pt with hx of HTN and tob dep, obesity  HYPERTENSION Currently taking: see medication list.  On lisinopril/HCTZ. Med Adherence: [x]  Yes    []  No Medication side effects: []  Yes    [x]  No Adherence with salt restriction: [x]  Yes    []  No Home Monitoring?: []  Yes    [x]  No Monitoring Frequency: []  Yes    []  No Home BP results range: []  Yes    []  No SOB? []  Yes    [x]  No Chest Pain?: []  Yes    [x]  No Leg swelling?: [x]  Yes, chronic in LT leg due to previous injury    Headaches?: []  Yes    [x]  No Dizziness? []  Yes    [x]  No Comments:   Tob dep:  Smokes cigars about 2 x a mth.  Never smoked cigarettes.  Would like to quit but he loves the smell of cigars.  Obesity:  Loss 27 lbs since Dec 2019.  Work out at gym 4 x a wk - 2 classes one is spin class and the other is aerobic/wgh class.  His goal is to lose 100 pounds by July of this year.  Does not eat pork or red meat, no fast foods.  Thinks portion sizes are find.  Drinks Anheuser-BuschMountain Dew or Coke 20 oz once a day and at least 3 cans of ginger ale a day.  Patient Active Problem List   Diagnosis Date Noted  . Hypertension 06/02/2015  . Headache 06/02/2015     Current Outpatient Medications on File Prior to Visit  Medication Sig Dispense Refill  . Ascorbic Acid (VITAMIN C PO) Take by mouth.     No current facility-administered medications on file prior to visit.     Allergies  Allergen Reactions  . Tea Anaphylaxis    Social History   Socioeconomic History  . Marital status: Single    Spouse name: Not on file  . Number of children: Not on file  . Years of education: Not on file  . Highest education level: Not on file  Occupational History  . Not on file  Social Needs  .  Financial resource strain: Not on file  . Food insecurity:    Worry: Not on file    Inability: Not on file  . Transportation needs:    Medical: Not on file    Non-medical: Not on file  Tobacco Use  . Smoking status: Light Tobacco Smoker    Types: Cigars  . Smokeless tobacco: Never Used  Substance and Sexual Activity  . Alcohol use: Yes    Comment: occasionally  . Drug use: No  . Sexual activity: Not on file  Lifestyle  . Physical activity:    Days per week: Not on file    Minutes per session: Not on file  . Stress: Not on file  Relationships  . Social connections:    Talks on phone: Not on file    Gets together: Not on file    Attends religious service: Not on file    Active member of club or organization: Not on file    Attends meetings of clubs or organizations: Not on file    Relationship status: Not on file  .  Intimate partner violence:    Fear of current or ex partner: Not on file    Emotionally abused: Not on file    Physically abused: Not on file    Forced sexual activity: Not on file  Other Topics Concern  . Not on file  Social History Narrative  . Not on file    Family History  Problem Relation Age of Onset  . Hypertension Mother   . Hypertension Father     Past Surgical History:  Procedure Laterality Date  . FOOT SURGERY    . KNEE SURGERY    . ORIF RADIUS & ULNA FRACTURES    . ORIF TIBIA & FIBULA FRACTURES    . ORTHOPEDIC SURGERY    . SHOULDER SURGERY      ROS: Review of Systems Negative except as stated above  PHYSICAL EXAM: BP (!) 154/100   Pulse 77   Temp 97.8 F (36.6 C) (Oral)   Resp 16   Ht 6\' 4"  (1.93 m)   Wt (!) 406 lb 6.4 oz (184.3 kg)   SpO2 96%   BMI 49.47 kg/m   Wt Readings from Last 3 Encounters:  04/16/18 (!) 406 lb 6.4 oz (184.3 kg)  08/30/15 (!) 343 lb (155.6 kg)  08/27/15 (!) 359 lb 9 oz (163.1 kg)  Repeat BP 154/100  Physical Exam  General appearance - alert, well appearing, obese middle-aged African-American  male and in no distress Mental status - normal mood, behavior, speech, dress, motor activity, and thought processes Mouth - mucous membranes moist, pharynx normal without lesions Neck - supple, no significant adenopathy Chest - clear to auscultation, no wheezes, rales or rhonchi, symmetric air entry Heart - normal rate, regular rhythm, normal S1, S2, no murmurs, rubs, clicks or gallops Extremities - peripheral pulses normal, no pedal edema, no clubbing or cyanosis  CMP Latest Ref Rng & Units 08/22/2016 08/22/2016 07/04/2016  Glucose 65 - 99 mg/dL 672(C) 947(S) 99  BUN 6 - 20 mg/dL 11 12 8   Creatinine 0.61 - 1.24 mg/dL 9.62 8.36 6.29  Sodium 135 - 145 mmol/L 141 139 139  Potassium 3.5 - 5.1 mmol/L 3.2(L) 3.3(L) 3.1(L)  Chloride 101 - 111 mmol/L 101 104 103  CO2 22 - 32 mmol/L - 26 27  Calcium 8.9 - 10.3 mg/dL - 8.8(L) 9.0  Total Protein 6.5 - 8.1 g/dL - 7.4 -  Total Bilirubin 0.3 - 1.2 mg/dL - 0.5 -  Alkaline Phos 38 - 126 U/L - 76 -  AST 15 - 41 U/L - 48(H) -  ALT 17 - 63 U/L - 60 -   Lipid Panel     Component Value Date/Time   CHOL 155 07/19/2010 0752   TRIG 109 07/19/2010 0752   HDL 31 (L) 07/19/2010 0752   CHOLHDL 5.0 07/19/2010 0752   VLDL 22 07/19/2010 0752   LDLCALC (H) 07/19/2010 0752    102        Total Cholesterol/HDL:CHD Risk Coronary Heart Disease Risk Table                     Men   Women  1/2 Average Risk   3.4   3.3  Average Risk       5.0   4.4  2 X Average Risk   9.6   7.1  3 X Average Risk  23.4   11.0        Use the calculated Patient Ratio above and the CHD Risk Table to determine  the patient's CHD Risk.        ATP III CLASSIFICATION (LDL):  <100     mg/dL   Optimal  244-010  mg/dL   Near or Above                    Optimal  130-159  mg/dL   Borderline  272-536  mg/dL   High  >644     mg/dL   Very High    CBC    Component Value Date/Time   WBC 10.2 08/22/2016 2157   RBC 5.03 08/22/2016 2157   HGB 15.0 08/22/2016 2213   HCT 44.0 08/22/2016  2213   PLT 242 08/22/2016 2157   MCV 82.9 08/22/2016 2157   MCH 29.0 08/22/2016 2157   MCHC 35.0 08/22/2016 2157   RDW 13.7 08/22/2016 2157   LYMPHSABS 3.6 08/22/2016 2157   MONOABS 0.9 08/22/2016 2157   EOSABS 0.4 08/22/2016 2157   BASOSABS 0.1 08/22/2016 2157    ASSESSMENT AND PLAN: 1. Essential hypertension Not at goal. DASH diet discussed and encouraged. Continue lisinopril/HCTZ.  Add amlodipine. Follow-up with clinical pharmacist in 2 weeks for repeat blood pressure check - Comprehensive metabolic panel - Lipid panel - lisinopril-hydrochlorothiazide (PRINZIDE,ZESTORETIC) 20-25 MG tablet; Take 1 tablet by mouth daily.  Dispense: 30 tablet; Refill: 6 - amLODipine (NORVASC) 5 MG tablet; Take 1 tablet (5 mg total) by mouth daily.  Dispense: 30 tablet; Refill: 6  2. Tobacco use Patient advised to quit smoking. Discussed health risks associated with smoking including lung and other types of cancers, chronic lung diseases and CV risks.. Pt has not made up his mind completely to quit. Less than 5-minute spent on counseling.  3. Morbid obesity (HCC) Commended him on regular exercise.  Encouraged him to keep it up. Dietary counseling discussed.  Encourage him to eliminate all sugary drinks including the regular sodas, juices and sweet tea.  Advised to cut back on white carbohydrates.  Printed information given - Lipid panel  Patient was given the opportunity to ask questions.  Patient verbalized understanding of the plan and was able to repeat key elements of the plan.   Orders Placed This Encounter  Procedures  . Comprehensive metabolic panel  . Lipid panel     Requested Prescriptions   Signed Prescriptions Disp Refills  . lisinopril-hydrochlorothiazide (PRINZIDE,ZESTORETIC) 20-25 MG tablet 30 tablet 6    Sig: Take 1 tablet by mouth daily.  Marland Kitchen amLODipine (NORVASC) 5 MG tablet 30 tablet 6    Sig: Take 1 tablet (5 mg total) by mouth daily.    Return in about 3 months  (around 07/17/2018).  [name] Blue, MD, FACP

## 2018-04-17 ENCOUNTER — Other Ambulatory Visit: Payer: Self-pay | Admitting: Internal Medicine

## 2018-04-17 LAB — COMPREHENSIVE METABOLIC PANEL
A/G RATIO: 1.7 (ref 1.2–2.2)
ALBUMIN: 4.4 g/dL (ref 4.0–5.0)
ALK PHOS: 93 IU/L (ref 39–117)
ALT: 42 IU/L (ref 0–44)
AST: 39 IU/L (ref 0–40)
BILIRUBIN TOTAL: 0.4 mg/dL (ref 0.0–1.2)
BUN / CREAT RATIO: 9 (ref 9–20)
BUN: 10 mg/dL (ref 6–20)
CHLORIDE: 96 mmol/L (ref 96–106)
CO2: 28 mmol/L (ref 20–29)
Calcium: 9.5 mg/dL (ref 8.7–10.2)
Creatinine, Ser: 1.12 mg/dL (ref 0.76–1.27)
GFR calc non Af Amer: 82 mL/min/{1.73_m2} (ref >59)
GFR, EST AFRICAN AMERICAN: 95 mL/min/{1.73_m2} (ref >59)
GLOBULIN, TOTAL: 2.6 g/dL (ref 1.5–4.5)
Glucose: 88 mg/dL (ref 65–99)
Potassium: 3.3 mmol/L — ABNORMAL LOW (ref 3.5–5.2)
SODIUM: 141 mmol/L (ref 134–144)
Total Protein: 7 g/dL (ref 6.0–8.5)

## 2018-04-17 LAB — LIPID PANEL
CHOLESTEROL TOTAL: 165 mg/dL (ref 100–199)
Chol/HDL Ratio: 5.2 ratio — ABNORMAL HIGH (ref 0.0–5.0)
HDL: 32 mg/dL — ABNORMAL LOW (ref >39)
LDL CALC: 103 mg/dL — AB (ref 0–99)
Triglycerides: 148 mg/dL (ref 0–149)
VLDL Cholesterol Cal: 30 mg/dL (ref 5–40)

## 2018-04-17 MED ORDER — POTASSIUM CHLORIDE ER 8 MEQ PO TBCR: 16.0000 meq | EXTENDED_RELEASE_TABLET | Freq: Every day | ORAL | 6 refills | Status: DC

## 2018-04-26 ENCOUNTER — Telehealth: Payer: Self-pay

## 2018-04-26 NOTE — Telephone Encounter (Signed)
Contacted pt to go over lab results pt is aware and doesn't have any questions or concerns 

## 2018-04-30 ENCOUNTER — Encounter: Payer: Self-pay | Admitting: Pharmacist

## 2018-04-30 ENCOUNTER — Ambulatory Visit: Payer: 59 | Attending: Internal Medicine | Admitting: Pharmacist

## 2018-04-30 ENCOUNTER — Other Ambulatory Visit: Payer: Self-pay

## 2018-04-30 VITALS — BP 158/104 | HR 82

## 2018-04-30 DIAGNOSIS — I10 Essential (primary) hypertension: Secondary | ICD-10-CM

## 2018-04-30 NOTE — Patient Instructions (Signed)
Thank you for coming to see [place] today.   Blood pressure today is elevated. Please pick the amlodipine up from your pharmacy.  Continue taking other blood pressure medications as prescribed.   Limiting salt and caffeine, as well as exercising as able for at least 30 minutes for 5 days out of the week, can also help you lower your blood pressure.  Take your blood pressure at home if you are able. Please write down these numbers and bring them to your visits.  If you have any questions about medications, please call me [phone].  Franky Macho

## 2018-04-30 NOTE — Progress Notes (Signed)
   S:    PCP: Dr. Laural Benes  Patient arrives in good spirits. Presents to the clinic for hypertension management. Patient was referred by Dr. Laural Benes on 04/16/18. BP 154/100 at that visit - Dr. Laural Benes added amlodipine to regimen.   Patient denies adherence with medications. Patient denies chest pain, dyspnea, HA or blurred vision.   Current BP Medications include:  Amlodipine 5 mg daily (did not pick up),  Lisinopril-HCTZ 20-25 mg daily  Antihypertensives tried in the past include: single HCTZ, single lisinopril  Dietary habits include: limits salt, denies drinking caffeine Exercise habits include: nothing outside of work Family / Social history:  - FHx: HTN (mother, father) - Current some-day smoker (cigars) - Drinks alcohol occasionally   Home BP readings: does not check at home  O:  L arm after 5 minutes rest: 158/104, HR 82  Last 3 Office BP readings: BP Readings from Last 3 Encounters:  04/16/18 (!) 154/100  04/02/18 (!) 160/100  07/28/17 (!) 142/70   BMET    Component Value Date/Time   NA 141 04/16/2018 1632   K 3.3 (L) 04/16/2018 1632   CL 96 04/16/2018 1632   CO2 28 04/16/2018 1632   GLUCOSE 88 04/16/2018 1632   GLUCOSE 109 (H) 08/22/2016 2213   BUN 10 04/16/2018 1632   CREATININE 1.12 04/16/2018 1632   CALCIUM 9.5 04/16/2018 1632   GFRNONAA 82 04/16/2018 1632   GFRAA 95 04/16/2018 1632   Renal function: Estimated Creatinine Clearance: 157.6 mL/min (by C-G formula based on SCr of 1.12 mg/dL).  Clinical ASCVD: No  The ASCVD Risk score Denman George DC Jr., et al., 2013) failed to calculate for the following reasons:   The 2013 ASCVD risk score is only valid for ages 37 to 10  A/P: Hypertension longstanding currently uncontrolled on current medications. BP Goal <130/80 mmHg. Patient is not adherent with current medications.  -Continued current regimen. Pt instructed to pick up amlodipine.   -Counseled on lifestyle modifications for blood pressure control including  reduced dietary sodium, increased exercise, adequate sleep -HM: Pneumovax recommended with smoking history; patient declined - "I've never had pneumonia in my life"  Results reviewed and written information provided. Total time in face-to-face counseling 15 minutes.   F/U Clinic Visit in 1 month w/ PCP for leg pain.    Butch Penny, PharmD, CPP Clinical Pharmacist [facility] [phone]

## 2018-07-19 ENCOUNTER — Encounter: Payer: Self-pay | Admitting: Internal Medicine

## 2018-07-19 ENCOUNTER — Ambulatory Visit: Payer: 59 | Attending: Internal Medicine | Admitting: Internal Medicine

## 2018-07-19 ENCOUNTER — Ambulatory Visit: Payer: 59 | Admitting: Internal Medicine

## 2018-07-19 DIAGNOSIS — Z720 Tobacco use: Secondary | ICD-10-CM

## 2018-07-19 DIAGNOSIS — M25562 Pain in left knee: Secondary | ICD-10-CM | POA: Diagnosis not present

## 2018-07-19 DIAGNOSIS — F17290 Nicotine dependence, other tobacco product, uncomplicated: Secondary | ICD-10-CM | POA: Diagnosis not present

## 2018-07-19 DIAGNOSIS — M25561 Pain in right knee: Secondary | ICD-10-CM | POA: Diagnosis not present

## 2018-07-19 DIAGNOSIS — I10 Essential (primary) hypertension: Secondary | ICD-10-CM

## 2018-07-19 MED ORDER — BLOOD PRESSURE MONITOR DEVI: 0 refills | Status: DC

## 2018-07-19 MED ORDER — ATENOLOL 25 MG PO TABS: 12.5000 mg | ORAL_TABLET | Freq: Every day | ORAL | 3 refills | Status: DC

## 2018-07-19 MED ORDER — DICLOFENAC SODIUM 1 % TD GEL: 2.0000 g | Freq: Four times a day (QID) | TRANSDERMAL | 2 refills | Status: DC

## 2018-07-19 MED ORDER — AMLODIPINE BESYLATE 5 MG PO TABS: 5.0000 mg | ORAL_TABLET | Freq: Every day | ORAL | 6 refills | Status: DC

## 2018-07-19 NOTE — Progress Notes (Signed)
Virtual Visit via Telephone Note Due to current restrictions/limitations of in-office visits due to the COVID-19 pandemic, this scheduled clinical appointment was converted to a telehealth visit  I connected with [name] [name] on 07/19/18 at 2:35 p.m EDT by telephone and verified that I am speaking with the correct person using two identifiers. I am in my office.  The patient is at home.  Only the patient and myself participated in this encounter.  I discussed the limitations, risks, security and privacy concerns of performing an evaluation and management service by telephone and the availability of in person appointments. I also discussed with the patient that there may be a patient responsible charge related to this service. The patient expressed understanding and agreed to proceed.   History of Present Illness: Pt with hx of HTN and tob dep, obesity.  Patient last seen 04/2018  Patient complains of bilateral knee pain.  Some knee pain on and off with weather changes.  No swelling in the knees.   Had ACL repaired in RT knee and fracture with hardware in placed in LT knee.  Reports in the past previous PCP had given him Voltaren gel along with some Vicodin to use as needed.   Obesity: On last visit he weighed 406 pounds.  Wgh last wk was 363 lbs. he was going to the gym regularly until it closed due to COVID-19.   runs 3 x a wk and rides his bike.  His goal is to get to 270 lbs. On salad kick; does not eat pork or beef No sodas in 2 mths  HTN: He saw the clinical pharmacist since last visit with me.  He was not on amlodipine.  He still is not on it.  He said his pharmacy never received it.  He is taking the lisinopril/HCTZ and the potassium.  He limits salt in the foods.  He denies any chest pains or shortness of breath.  No chronic headaches or dizziness.  He has mild swelling in the left lower leg which is chronic  Tob Dep: Still smoking cigars.  States he is working on trying to quit but  " taking one thing at a time."  He does not feel he needs any medication to help him quit.   Observations/Objective: Results for orders placed or performed in visit on 04/16/18  Comprehensive metabolic panel  Result Value Ref Range   Glucose 88 65 - 99 mg/dL   BUN 10 6 - 20 mg/dL   Creatinine, Ser 1.611.12 0.76 - 1.27 mg/dL   GFR calc non Af Amer 82 >59 mL/min/1.73   GFR calc Af Amer 95 >59 mL/min/1.73   BUN/Creatinine Ratio 9 9 - 20   Sodium 141 134 - 144 mmol/L   Potassium 3.3 (L) 3.5 - 5.2 mmol/L   Chloride 96 96 - 106 mmol/L   CO2 28 20 - 29 mmol/L   Calcium 9.5 8.7 - 10.2 mg/dL   Total Protein 7.0 6.0 - 8.5 g/dL   Albumin 4.4 4.0 - 5.0 g/dL   Globulin, Total 2.6 1.5 - 4.5 g/dL   Albumin/Globulin Ratio 1.7 1.2 - 2.2   Bilirubin Total 0.4 0.0 - 1.2 mg/dL   Alkaline Phosphatase 93 39 - 117 IU/L   AST 39 0 - 40 IU/L   ALT 42 0 - 44 IU/L  Lipid panel  Result Value Ref Range   Cholesterol, Total 165 100 - 199 mg/dL   Triglycerides 096148 0 - 149 mg/dL   HDL 32 (L) >  39 mg/dL   VLDL Cholesterol Cal 30 5 - 40 mg/dL   LDL Calculated 481 (H) 0 - 99 mg/dL   Chol/HDL Ratio 5.2 (H) 0.0 - 5.0 ratio    Assessment and Plan: 1. Essential hypertension Level of control unknown.  I have sent a prescription to his pharmacy for blood pressure monitoring device. -Looks like his insurance does not cover amlodipine.  I think this may have been the issue.  He will continue lisinopril/HCTZ.  I gave him the option of coming in to have his blood pressure checked in about a week to see whether blood pressure is better control given that he has lost significant weight since last visit versus changing amlodipine to a more affordable medication like atenolol.  Patient states that he will be out of town for a month so he would prefer to have the prescription for the other blood pressure medication.  Prescription sent to his pharmacy for atenolol - Blood Pressure Monitor DEVI; Use as directed to check home blood  pressure 2-3 times a week  Dispense: 1 Device; Refill: 0  2. Tobacco use Advised to quit.  Patient states he is working on doing so.  He is aware of the health risks associated with smoking.  He declines any prescription medication to help with smoking cessation.  Less than 5 minutes spent on counseling  3. Morbid obesity (HCC) Commended him on weight loss so far.  Encouraged him to continue healthy eating habits and regular exercise  4. 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  I told him that we do not prescribe Vicodin or Percocet in this practice.  However we can prescribe the Voltaren gel or an oral anti-inflammatory.  He prefers to go with the Voltaren gel as it was helpful in the past. - diclofenac sodium (VOLTAREN) 1 % GEL; Apply 2 g topically 4 (four) times daily.  Dispense: 2 g; Refill: 2   Follow Up Instructions: 3 mth   I discussed the assessment and treatment plan with the patient. The patient was provided an opportunity to ask questions and all were answered. The patient agreed with the plan and demonstrated an understanding of the instructions.   The patient was advised to call back or seek an in-person evaluation if the symptoms worsen or if the condition fails to improve as anticipated.  I provided 15 minutes of non-face-to-face time during this encounter.   Jonah Blue, MD

## 2018-12-02 ENCOUNTER — Other Ambulatory Visit: Payer: Self-pay | Admitting: Internal Medicine

## 2018-12-02 DIAGNOSIS — I10 Essential (primary) hypertension: Secondary | ICD-10-CM

## 2018-12-03 ENCOUNTER — Encounter (HOSPITAL_BASED_OUTPATIENT_CLINIC_OR_DEPARTMENT_OTHER): Payer: Self-pay | Admitting: Emergency Medicine

## 2018-12-03 ENCOUNTER — Other Ambulatory Visit: Payer: Self-pay

## 2018-12-03 ENCOUNTER — Emergency Department (HOSPITAL_BASED_OUTPATIENT_CLINIC_OR_DEPARTMENT_OTHER)
Admission: EM | Admit: 2018-12-03 | Discharge: 2018-12-03 | Disposition: A | Discharge status: Home / Self Care | Payer: 59 | Attending: Emergency Medicine | Admitting: Emergency Medicine

## 2018-12-03 DIAGNOSIS — Z79899 Other long term (current) drug therapy: Secondary | ICD-10-CM | POA: Insufficient documentation

## 2018-12-03 DIAGNOSIS — Z791 Long term (current) use of non-steroidal anti-inflammatories (NSAID): Secondary | ICD-10-CM | POA: Insufficient documentation

## 2018-12-03 DIAGNOSIS — L0231 Cutaneous abscess of buttock: Secondary | ICD-10-CM | POA: Insufficient documentation

## 2018-12-03 DIAGNOSIS — I10 Essential (primary) hypertension: Secondary | ICD-10-CM | POA: Insufficient documentation

## 2018-12-03 DIAGNOSIS — Z8673 Personal history of transient ischemic attack (TIA), and cerebral infarction without residual deficits: Secondary | ICD-10-CM | POA: Diagnosis not present

## 2018-12-03 DIAGNOSIS — F17290 Nicotine dependence, other tobacco product, uncomplicated: Secondary | ICD-10-CM | POA: Insufficient documentation

## 2018-12-03 DIAGNOSIS — L0291 Cutaneous abscess, unspecified: Secondary | ICD-10-CM

## 2018-12-03 MED ORDER — IBUPROFEN 800 MG PO TABS: 800.0000 mg | ORAL_TABLET | Freq: Three times a day (TID) | ORAL | 0 refills | Status: DC

## 2018-12-03 MED ORDER — IBUPROFEN 800 MG PO TABS
800.0000 mg | ORAL_TABLET | Freq: Once | ORAL | Status: AC
  Administered 2018-12-03: 800 mg via ORAL
  Filled 2018-12-03: qty 1

## 2018-12-03 MED ORDER — CLINDAMYCIN HCL 300 MG PO CAPS: 300.0000 mg | ORAL_CAPSULE | Freq: Four times a day (QID) | ORAL | 0 refills | Status: DC

## 2018-12-03 MED ORDER — CLINDAMYCIN HCL 150 MG PO CAPS
300.0000 mg | ORAL_CAPSULE | Freq: Once | ORAL | Status: AC
  Administered 2018-12-03: 300 mg via ORAL
  Filled 2018-12-03: qty 2

## 2018-12-03 NOTE — ED Notes (Signed)
Abscess at the top of the buttock region that is draining very little after his girlfriend opened and drained the abscess.

## 2018-12-03 NOTE — ED Notes (Signed)
Skin assessment data was done at time of Pt. First assesssment

## 2018-12-03 NOTE — ED Triage Notes (Signed)
Pt states he had a bump on his butt a couple of days ago  Pt states the area got hot yesterday and busted open today  Pt is c/o pain  States the area is at the coccyx area

## 2018-12-04 ENCOUNTER — Encounter (HOSPITAL_BASED_OUTPATIENT_CLINIC_OR_DEPARTMENT_OTHER): Payer: Self-pay | Admitting: Emergency Medicine

## 2018-12-04 NOTE — ED Provider Notes (Signed)
[facility] EMERGENCY DEPARTMENT Provider Note   CSN: [id number] Arrival date & time: 12/03/18  2315     History   Chief Complaint Chief Complaint  Patient presents with   Abscess    HPI [name] [name] is a 38 y.o. male.     The history is provided by the patient.  Abscess Location:  Pelvis Pelvic abscess location:  Gluteal cleft Abscess quality: draining and painful   Red streaking: no   Duration:  2 days Progression:  Partially resolved Pain details:    Quality:  Dull   Severity:  Moderate   Duration:  2 days   Timing:  Constant   Progression:  Unchanged Chronicity:  New Context: not diabetes   Relieved by:  Nothing Worsened by:  Nothing Ineffective treatments:  None tried Associated symptoms: no anorexia   Risk factors: no family hx of MRSA   Girlfriend decompressed it and it has been draining since earlier today.  No f/c/r.   Past Medical History:  Diagnosis Date   Hypercholesteremia    Hypertension    Stroke [facility](HCC)    Stroke [facility](HCC) 2012    Patient Active Problem List   Diagnosis Date Noted   Tobacco use 04/16/2018   Morbid obesity (HCC) 04/16/2018   Hypertension 06/02/2015    Past Surgical History:  Procedure Laterality Date   FOOT SURGERY     KNEE SURGERY     ORIF RADIUS & ULNA FRACTURES     ORIF TIBIA & FIBULA FRACTURES     ORTHOPEDIC SURGERY     SHOULDER SURGERY          Home Medications    Prior to Admission medications   Medication Sig Start Date End Date Taking? Authorizing Provider  Ascorbic Acid (VITAMIN C PO) Take by mouth.    [provider]  atenolol (TENORMIN) 25 MG tablet Take 0.5 tablets (12.5 mg total) by mouth daily. Stop Amlodipine 07/19/18   Marcine MatarJohnson, Deborah B, MD  Blood Pressure Monitor DEVI Use as directed to check home blood pressure 2-3 times a week 07/19/18   Marcine MatarJohnson, Deborah B, MD  clindamycin (CLEOCIN) 300 MG capsule Take 1 capsule (300 mg total) by mouth 4 (four) times daily. X  7 days 12/03/18   Lorcan Shelp, MD  diclofenac sodium (VOLTAREN) 1 % GEL Apply 2 g topically 4 (four) times daily. 07/19/18   Marcine MatarJohnson, Deborah B, MD  esomeprazole (NEXIUM) 40 MG capsule Take by mouth. 10/29/16   [provider]  ibuprofen (ADVIL) 800 MG tablet Take 1 tablet (800 mg total) by mouth 3 (three) times daily. 12/03/18   Daesha Insco, MD  lisinopril-hydrochlorothiazide (ZESTORETIC) 20-25 MG tablet TAKE 1 TABLET BY MOUTH DAILY 12/02/18   Marcine MatarJohnson, Deborah B, MD  potassium chloride (KLOR-CON) 8 MEQ tablet Take 2 tablets (16 mEq total) by mouth daily. 04/17/18   Marcine MatarJohnson, Deborah B, MD    Family History Family History  Problem Relation Age of Onset   Diabetes Mother     Social History Social History   Tobacco Use   Smoking status: Light Tobacco Smoker    Types: Cigars   Smokeless tobacco: Never Used  Substance Use Topics   Alcohol use: Yes    Comment: occasionally   Drug use: No     Allergies   Tea   Review of Systems Review of Systems  Constitutional: Negative for unexpected weight change.  HENT: Negative for congestion.   Eyes: Negative for visual disturbance.  Respiratory: Negative for  cough.   Cardiovascular: Negative for chest pain.  Gastrointestinal: Negative for abdominal pain and anorexia.  Genitourinary: Negative for difficulty urinating.  Musculoskeletal: Negative for arthralgias.  Skin: Negative for rash.  Neurological: Negative for dizziness.  Psychiatric/Behavioral: Negative for agitation.  All other systems reviewed and are negative.    Physical Exam Updated Vital Signs BP (!) 160/96 (BP Location: Right Arm)    Pulse 95    Temp 98.9 F (37.2 C) (Oral)    Resp 20    Ht 6\' 4"  (1.93 m)    Wt (!) 181.4 kg    SpO2 97%    BMI 48.69 kg/m   Physical Exam Vitals signs and nursing note reviewed.  Constitutional:      General: He is not in acute distress.    Appearance: He is obese.  HENT:     Head: Normocephalic and atraumatic.      Nose: Nose normal.  Eyes:     Conjunctiva/sclera: Conjunctivae normal.     Pupils: Pupils are equal, round, and reactive to light.  Neck:     Musculoskeletal: Normal range of motion and neck supple.  Cardiovascular:     Rate and Rhythm: Normal rate and regular rhythm.     Pulses: Normal pulses.     Heart sounds: Normal heart sounds.  Pulmonary:     Effort: Pulmonary effort is normal.     Breath sounds: Normal breath sounds.  Abdominal:     General: Abdomen is flat. Bowel sounds are normal.     Tenderness: There is no abdominal tenderness. There is no guarding.  Genitourinary:    Comments: Chaperone present. Skin nick 9 mm Left gluteal cleft, no cellulitis, no abscess Musculoskeletal: Normal range of motion.  Skin:    General: Skin is warm and dry.     Capillary Refill: Capillary refill takes less than 2 seconds.  Neurological:     General: No focal deficit present.     Mental Status: He is alert and oriented to person, place, and time.  Psychiatric:        Mood and Affect: Mood normal.        Behavior: Behavior normal.      ED Treatments / Results  Labs (all labs ordered are listed, but only abnormal results are displayed) Labs Reviewed - No data to display  EKG None  Radiology No results found.  Procedures Procedures (including critical care time)  Medications Ordered in ED Medications  clindamycin (CLEOCIN) capsule 300 mg (300 mg Oral Given 12/03/18 2344)  ibuprofen (ADVIL) tablet 800 mg (800 mg Oral Given 12/03/18 2344)     Given the abscess was drained without cleaning by partner will start antibiotics.  There is nothing to drain and no signs of infection at this time.  Strict return precautions given.   [name] [name] was evaluated in Emergency Department on 12/04/2018 for the symptoms described in the history of present illness. He was evaluated in the context of the global COVID-19 pandemic, which necessitated consideration that the patient might be  at risk for infection with the SARS-CoV-2 virus that causes COVID-19. Institutional protocols and algorithms that pertain to the evaluation of patients at risk for COVID-19 are in a state of rapid change based on information released by regulatory bodies including the CDC and federal and state organizations. These policies and algorithms were followed during the patient's care in the ED.   Final Clinical Impressions(s) / ED Diagnoses   Final diagnoses:  Abscess  Return for weakness, numbness, changes in vision or speech, fevers >100.4 unrelieved by medication, shortness of breath, intractable vomiting, or diarrhea, abdominal pain, Inability to tolerate liquids or food, cough, altered mental status or any concerns. No signs of systemic illness or infection. The patient is nontoxic-appearing on exam and vital signs are within normal limits.   I have reviewed the triage vital signs and the nursing notes. Pertinent labs &imaging results that were available during my care of the patient were reviewed by me and considered in my medical decision making (see chart for details).  After history, exam, and medical workup I feel the patient has been appropriately medically screened and is safe for discharge home. Pertinent diagnoses were discussed with the patient. Patient was given return precautions. ED Discharge Orders         Ordered    clindamycin (CLEOCIN) 300 MG capsule  4 times daily,   Status:  Discontinued     12/03/18 2331    ibuprofen (ADVIL) 800 MG tablet  3 times daily,   Status:  Discontinued     12/03/18 2331    clindamycin (CLEOCIN) 300 MG capsule  4 times daily     12/03/18 2340    ibuprofen (ADVIL) 800 MG tablet  3 times daily     12/03/18 2340           Joshaua Epple, MD 12/04/18 [phone]

## 2018-12-08 ENCOUNTER — Emergency Department (HOSPITAL_BASED_OUTPATIENT_CLINIC_OR_DEPARTMENT_OTHER)
Admission: EM | Admit: 2018-12-08 | Discharge: 2018-12-08 | Disposition: A | Discharge status: Home / Self Care | Payer: 59 | Attending: Emergency Medicine | Admitting: Emergency Medicine

## 2018-12-08 ENCOUNTER — Encounter (HOSPITAL_BASED_OUTPATIENT_CLINIC_OR_DEPARTMENT_OTHER): Payer: Self-pay | Admitting: Emergency Medicine

## 2018-12-08 ENCOUNTER — Other Ambulatory Visit: Payer: Self-pay

## 2018-12-08 DIAGNOSIS — F17290 Nicotine dependence, other tobacco product, uncomplicated: Secondary | ICD-10-CM | POA: Diagnosis not present

## 2018-12-08 DIAGNOSIS — Z79899 Other long term (current) drug therapy: Secondary | ICD-10-CM | POA: Diagnosis not present

## 2018-12-08 DIAGNOSIS — I10 Essential (primary) hypertension: Secondary | ICD-10-CM | POA: Diagnosis not present

## 2018-12-08 DIAGNOSIS — L0501 Pilonidal cyst with abscess: Secondary | ICD-10-CM

## 2018-12-08 MED ORDER — IBUPROFEN 800 MG PO TABS: 800.0000 mg | ORAL_TABLET | Freq: Three times a day (TID) | ORAL | 0 refills | Status: DC

## 2018-12-08 MED ORDER — CLINDAMYCIN HCL 300 MG PO CAPS: 300.0000 mg | ORAL_CAPSULE | Freq: Four times a day (QID) | ORAL | 0 refills | Status: DC

## 2018-12-08 MED ORDER — HYDROCODONE-ACETAMINOPHEN 5-325 MG PO TABS: 1.0000 | ORAL_TABLET | Freq: Four times a day (QID) | ORAL | 0 refills | Status: DC | PRN

## 2018-12-08 MED ORDER — LIDOCAINE-EPINEPHRINE (PF) 2 %-1:200000 IJ SOLN
10.0000 mL | Freq: Once | INTRAMUSCULAR | Status: AC
  Administered 2018-12-08: 10 mL
  Filled 2018-12-08: qty 10

## 2018-12-08 MED ORDER — HYDROCODONE-ACETAMINOPHEN 5-325 MG PO TABS
2.0000 | ORAL_TABLET | Freq: Once | ORAL | Status: AC
  Administered 2018-12-08: 14:00:00 2 via ORAL
  Filled 2018-12-08: qty 2

## 2018-12-08 NOTE — ED Notes (Signed)
Pt teaching provided on medications that may cause drowsiness. Pt instructed not to drive or operate heavy machinery while taking the prescribed medication. Pt verbalized understanding.   

## 2018-12-08 NOTE — ED Provider Notes (Signed)
[facility] EMERGENCY DEPARTMENT Provider Note   CSN: [id number] Arrival date & time: 12/08/18  1131     History   Chief Complaint Chief Complaint  Patient presents with  . Abscess    HPI [name] [name] is a 38 y.o. male.     HPI Patient started treatment for pilonidal abscess 4 days ago.  He has been taking clindamycin as prescribed.  He reports that the pain and swelling has increased significantly.  He reports now he can hardly sit down.  He has not developed any fever, chills or malaise.  Reports otherwise he feels well.  He has never had a similar problem. Past Medical History:  Diagnosis Date  . Hypercholesteremia   . Hypertension   . Stroke (HCC)   . Stroke [facility]) 2012    Patient Active Problem List   Diagnosis Date Noted  . Tobacco use 04/16/2018  . Morbid obesity (HCC) 04/16/2018  . Hypertension 06/02/2015    Past Surgical History:  Procedure Laterality Date  . FOOT SURGERY    . KNEE SURGERY    . ORIF RADIUS & ULNA FRACTURES    . ORIF TIBIA & FIBULA FRACTURES    . ORTHOPEDIC SURGERY    . SHOULDER SURGERY          Home Medications    Prior to Admission medications   Medication Sig Start Date End Date Taking? Authorizing Provider  Ascorbic Acid (VITAMIN C PO) Take by mouth.   Yes [provider]  calcium carbonate (TUMS - DOSED IN MG ELEMENTAL CALCIUM) 500 MG chewable tablet Chew 1 tablet by mouth daily.   Yes [provider]  clindamycin (CLEOCIN) 300 MG capsule Take 1 capsule (300 mg total) by mouth 4 (four) times daily. X 7 days 12/03/18  Yes Palumbo, April, MD  lisinopril-hydrochlorothiazide (ZESTORETIC) 20-25 MG tablet TAKE 1 TABLET BY MOUTH DAILY 12/02/18  Yes Marcine Matar, MD  potassium chloride (KLOR-CON) 8 MEQ tablet Take 2 tablets (16 mEq total) by mouth daily. 04/17/18  Yes Marcine Matar, MD  Blood Pressure Monitor DEVI Use as directed to check home blood pressure 2-3 times a week 07/19/18   Marcine Matar, MD  clindamycin (CLEOCIN) 300 MG capsule Take 1 capsule (300 mg total) by mouth 4 (four) times daily. X 7 days 12/08/18   Arby Barrette, MD  diclofenac sodium (VOLTAREN) 1 % GEL Apply 2 g topically 4 (four) times daily. 07/19/18   Marcine Matar, MD  HYDROcodone-acetaminophen (NORCO/VICODIN) 5-325 MG tablet Take 1-2 tablets by mouth every 6 (six) hours as needed for moderate pain or severe pain. 12/08/18   Arby Barrette, MD  ibuprofen (ADVIL) 800 MG tablet Take 1 tablet (800 mg total) by mouth 3 (three) times daily. 12/03/18   Palumbo, April, MD  ibuprofen (ADVIL) 800 MG tablet Take 1 tablet (800 mg total) by mouth 3 (three) times daily. 12/08/18   Arby Barrette, MD    Family History Family History  Problem Relation Age of Onset  . Diabetes Mother     Social History Social History   Tobacco Use  . Smoking status: Light Tobacco Smoker    Types: Cigars  . Smokeless tobacco: Never Used  Substance Use Topics  . Alcohol use: Yes    Comment: occasionally  . Drug use: No     Allergies   Tea   Review of Systems Review of Systems 10 Systems reviewed and are negative for acute change except as noted in  the HPI.  Physical Exam Updated Vital Signs BP (!) 150/98 (BP Location: Right Arm)   Pulse 78   Temp 98.6 F (37 C) (Oral)   Resp 20   Ht 6\' 4"  (1.93 m)   Wt (!) 181.4 kg   SpO2 99%   BMI 48.69 kg/m   Physical Exam Constitutional:      Appearance: Normal appearance. He is obese.  Pulmonary:     Effort: Pulmonary effort is normal.  Musculoskeletal: Normal range of motion.        General: Swelling and tenderness present.     Comments: Fluctuant pilonidal abscess see attached images.  Patient is ambulatory with normal gait.  No peripheral edema.  Neurological:     General: No focal deficit present.     Mental Status: He is alert and oriented to person, place, and time.     Coordination: Coordination normal.  Psychiatric:        Mood and Affect: Mood  normal.        ED Treatments / Results  Labs (all labs ordered are listed, but only abnormal results are displayed) Labs Reviewed - No data to display  EKG None  Radiology No results found.  Procedures .Marland KitchenIncision and Drainage  Date/Time: 12/08/2018 2:22 PM Performed by: Charlesetta Shanks, MD Authorized by: Charlesetta Shanks, MD   Consent:    Consent obtained:  Verbal   Consent given by:  Patient   Risks discussed:  Bleeding, incomplete drainage, pain, infection and damage to other organs   Alternatives discussed:  Delayed treatment and alternative treatment Location:    Type:  Abscess   Size:  4   Location:  Anogenital   Anogenital location:  Gluteal cleft Pre-procedure details:    Skin preparation:  Betadine Anesthesia (see MAR for exact dosages):    Anesthesia method:  Local infiltration   Local anesthetic:  Lidocaine 2% WITH epi Procedure type:    Complexity:  Complex Procedure details:    Incision types:  Single straight   Incision depth:  Subcutaneous   Scalpel blade:  11   Wound management:  Probed and deloculated   Drainage:  Purulent   Drainage amount:  Copious   Packing materials:  1/4 in iodoform gauze   Amount 1/4" iodoform:  10 Post-procedure details:    Patient tolerance of procedure:  Tolerated well, no immediate complications   (including critical care time)  Medications Ordered in ED Medications  lidocaine-EPINEPHrine (XYLOCAINE W/EPI) 2 %-1:200000 (PF) injection 10 mL (10 mLs Infiltration Given by Other 12/08/18 1337)  HYDROcodone-acetaminophen (NORCO/VICODIN) 5-325 MG per tablet 2 tablet (2 tablets Oral Given 12/08/18 1335)     Initial Impression / Assessment and Plan / ED Course  I have reviewed the triage vital signs and the nursing notes.  Pertinent labs & imaging results that were available during my care of the patient were reviewed by me and considered in my medical decision making (see chart for details).       Patient is  clinically well.  He has a focal gluteal cleft abscess.  No involvement of the perianal area.  He has been on clindamycin without improvement.  This was fluctuant and drained with copious output.  Packing placed.  Patient is counseled on return precautions.  He is counseled on 48-hour return for packing removal and examination.  Final Clinical Impressions(s) / ED Diagnoses   Final diagnoses:  Pilonidal abscess    ED Discharge Orders         Ordered  ibuprofen (ADVIL) 800 MG tablet  3 times daily     12/08/18 1419    HYDROcodone-acetaminophen (NORCO/VICODIN) 5-325 MG tablet  Every 6 hours PRN     12/08/18 1419    clindamycin (CLEOCIN) 300 MG capsule  4 times daily     12/08/18 1419           Arby BarrettePfeiffer, Neosha Switalski, MD 12/08/18 1426

## 2018-12-08 NOTE — Discharge Instructions (Addendum)
1.  Try to leave the packing in place for 2 days.  You should be seen by your doctor or return to the emergency department to have the packing removed and wound examined.  You may change the outer dressing if it becomes wet or soiled. 2.  Take ibuprofen every 8 hours for pain control.  You may also take 1-2 Vicodin every 6 hours if needed for additional pain control.  The pain should start improving fairly quickly now that it has been drained. 3.  Return to the emergency department if you develop fever, worsening pain, redness that is moving out from the wound or other concerning symptoms.

## 2018-12-08 NOTE — ED Triage Notes (Signed)
Abscess to buttocks. Was seen here last week and given abx and had it drained. Pt states it is not getting better.

## 2018-12-31 ENCOUNTER — Telehealth: Payer: Self-pay | Admitting: Internal Medicine

## 2018-12-31 DIAGNOSIS — I10 Essential (primary) hypertension: Secondary | ICD-10-CM

## 2018-12-31 MED ORDER — POTASSIUM CHLORIDE ER 8 MEQ PO TBCR: 16.0000 meq | EXTENDED_RELEASE_TABLET | Freq: Every day | ORAL | 0 refills | Status: DC

## 2018-12-31 MED ORDER — LISINOPRIL-HYDROCHLOROTHIAZIDE 20-25 MG PO TABS: 1.0000 | ORAL_TABLET | Freq: Every day | ORAL | 0 refills | Status: DC

## 2018-12-31 NOTE — Telephone Encounter (Signed)
1) Medication(s) Requested (by name): -lisinopril-hydrochlorothiazide (ZESTORETIC) 20-25 MG tablet  -potassium chloride (KLOR-CON) 8 MEQ tablet   2) Pharmacy of Choice: -[id number] - [place], [place] - [address] AT [address]  Pt denied appt. Will schedule at another time

## 2019-02-03 ENCOUNTER — Telehealth: Payer: Self-pay | Admitting: Internal Medicine

## 2019-02-03 NOTE — Telephone Encounter (Signed)
1) Medication(s) Requested (by name): -lisinopril-hydrochlorothiazide (ZESTORETIC) 20-25 MG tablet  -potassium chloride (KLOR-CON) 8 MEQ tablet   2) Pharmacy of Choice: [id number] - [place], [address]   3.) Special request: Pt wants medication sent with refills if possible. Pt denied appt.

## 2019-02-04 NOTE — Telephone Encounter (Signed)
Patient must have appointment

## 2019-02-08 ENCOUNTER — Encounter (HOSPITAL_COMMUNITY): Payer: Self-pay

## 2019-02-08 ENCOUNTER — Other Ambulatory Visit: Payer: Self-pay

## 2019-02-08 ENCOUNTER — Ambulatory Visit (HOSPITAL_COMMUNITY)
Admission: EM | Admit: 2019-02-08 | Discharge: 2019-02-08 | Disposition: A | Discharge status: Home / Self Care | Payer: 59 | Attending: Urgent Care | Admitting: Urgent Care

## 2019-02-08 DIAGNOSIS — I10 Essential (primary) hypertension: Secondary | ICD-10-CM

## 2019-02-08 DIAGNOSIS — Z760 Encounter for issue of repeat prescription: Secondary | ICD-10-CM

## 2019-02-08 DIAGNOSIS — R030 Elevated blood-pressure reading, without diagnosis of hypertension: Secondary | ICD-10-CM

## 2019-02-08 MED ORDER — LISINOPRIL-HYDROCHLOROTHIAZIDE 20-25 MG PO TABS: 1.0000 | ORAL_TABLET | Freq: Every day | ORAL | 0 refills | Status: DC

## 2019-02-08 MED ORDER — POTASSIUM CHLORIDE ER 10 MEQ PO TBCR: 10.0000 meq | EXTENDED_RELEASE_TABLET | Freq: Every day | ORAL | 0 refills | Status: DC

## 2019-02-08 NOTE — ED Triage Notes (Signed)
Pt presents for medication refill on blood pressure medication; pt has had an elevated blood pressure since he has been without medication.

## 2019-02-08 NOTE — Discharge Instructions (Signed)
For your diabetes, please make sure you are avoiding starchy, carbohydrate foods like pasta, breads, pastry, rice, potatoes, desserts. These foods can elevated your blood sugar. Also, limit your alcohol drinking to 1 per day, avoid sodas, sweet teas. For elevated blood pressure, make sure you are monitoring salt in your diet.  Do not eat restaurant foods and limit processed foods at home.  Processed foods include things like frozen meals preseason meats and dinners.  Make sure your pain attention to sodium labels on foods you by at the grocery store.  For seasoning you can use a brand called Mrs. Dash which includes a lot of salt free seasonings. ° °Salads - kale, spinach, cabbage, spring mix; use seeds like pumpkin seeds or sunflower seeds, almonds; you can also use 1-2 hard boiled eggs in your salads °Fruits - avocadoes, berries (blueberries, raspberries, blackberries), apples, oranges °Vegetables - aspargus, cauliflower, broccoli, green beans, brussel spouts, bell peppers; stay away from starchy vegetables like potatoes, carrots, peas ° °Regarding meat it is better to eat lean meats and limit your red meat consumption including pork.  Wild caught fish, chicken breast are good options. ° °Do not eat any foods on this list that you are allergic to. ° °

## 2019-02-08 NOTE — ED Provider Notes (Signed)
[place]   MRN: [id number] DOB: 21-Feb-1980  Subjective:   [name] [name] is a 38 y.o. male presenting for refill on his medication.  Patient has longstanding history of hypertension with associated hyperkalemia.  Needs a refill of his lisinopril hydrochlorothiazide and potassium supplement.  Patient does have a PCP and has had a very difficult time trying to get back in with him.  He is currently in the process of establishing with a new PCP and would like a recommendation.  No current facility-administered medications for this encounter.  Current Outpatient Medications:  .  Ascorbic Acid (VITAMIN C PO), Take by mouth., Disp: , Rfl:  .  Blood Pressure Monitor DEVI, Use as directed to check home blood pressure 2-3 times a week, Disp: 1 Device, Rfl: 0 .  calcium carbonate (TUMS - DOSED IN MG ELEMENTAL CALCIUM) 500 MG chewable tablet, Chew 1 tablet by mouth daily., Disp: , Rfl:  .  clindamycin (CLEOCIN) 300 MG capsule, Take 1 capsule (300 mg total) by mouth 4 (four) times daily. X 7 days, Disp: 28 capsule, Rfl: 0 .  clindamycin (CLEOCIN) 300 MG capsule, Take 1 capsule (300 mg total) by mouth 4 (four) times daily. X 7 days, Disp: 12 capsule, Rfl: 0 .  diclofenac sodium (VOLTAREN) 1 % GEL, Apply 2 g topically 4 (four) times daily., Disp: 2 g, Rfl: 2 .  HYDROcodone-acetaminophen (NORCO/VICODIN) 5-325 MG tablet, Take 1-2 tablets by mouth every 6 (six) hours as needed for moderate pain or severe pain., Disp: 12 tablet, Rfl: 0 .  ibuprofen (ADVIL) 800 MG tablet, Take 1 tablet (800 mg total) by mouth 3 (three) times daily., Disp: 21 tablet, Rfl: 0 .  ibuprofen (ADVIL) 800 MG tablet, Take 1 tablet (800 mg total) by mouth 3 (three) times daily., Disp: 21 tablet, Rfl: 0 .  lisinopril-hydrochlorothiazide (ZESTORETIC) 20-25 MG tablet, Take 1 tablet by mouth daily. Must have office visit for refills. Last fill., Disp: 30 tablet, Rfl: 0 .  potassium chloride (KLOR-CON) 8 MEQ tablet, Take 2  tablets (16 mEq total) by mouth daily. Must have office visit for refills. Last fill., Disp: 60 tablet, Rfl: 0   Allergies  Allergen Reactions  . Tea Anaphylaxis    Past Medical History:  Diagnosis Date  . Hypercholesteremia   . Hypertension   . Stroke ([place])   . Stroke [facility]) 2012     Past Surgical History:  Procedure Laterality Date  . FOOT SURGERY    . KNEE SURGERY    . ORIF RADIUS & ULNA FRACTURES    . ORIF TIBIA & FIBULA FRACTURES    . ORTHOPEDIC SURGERY    . SHOULDER SURGERY      Family History  Problem Relation Age of Onset  . Diabetes Mother     Social History   Tobacco Use  . Smoking status: Light Tobacco Smoker    Types: Cigars  . Smokeless tobacco: Never Used  Substance Use Topics  . Alcohol use: Yes    Comment: occasionally  . Drug use: No    Review of Systems  Constitutional: Negative for fever and malaise/fatigue.  HENT: Negative for congestion, ear pain, sinus pain and sore throat.   Eyes: Negative for blurred vision, double vision, discharge and redness.  Respiratory: Negative for cough, hemoptysis, shortness of breath and wheezing.   Cardiovascular: Negative for chest pain.  Gastrointestinal: Negative for abdominal pain, diarrhea, nausea and vomiting.  Genitourinary: Negative for dysuria, flank pain and hematuria.  Musculoskeletal: Negative for  myalgias.  Skin: Negative for rash.  Neurological: Negative for dizziness, tingling, tremors, sensory change, speech change, focal weakness, weakness and headaches.  Psychiatric/Behavioral: Negative for depression and substance abuse.     Objective:   Vitals: BP (!) 154/99 (BP Location: Right Arm)   Pulse 71   Temp 98.7 F (37.1 C) (Oral)   Resp 18   SpO2 100%   Physical Exam Constitutional:      General: He is not in acute distress.    Appearance: Normal appearance. He is well-developed. He is obese. He is not ill-appearing, toxic-appearing or diaphoretic.  HENT:     Head: Normocephalic  and atraumatic.     Right Ear: External ear normal.     Left Ear: External ear normal.     Nose: Nose normal.     Mouth/Throat:     Mouth: Mucous membranes are moist.     Pharynx: Oropharynx is clear.  Eyes:     General: No scleral icterus.    Extraocular Movements: Extraocular movements intact.     Pupils: Pupils are equal, round, and reactive to light.  Cardiovascular:     Rate and Rhythm: Normal rate and regular rhythm.     Heart sounds: Normal heart sounds. No murmur. No friction rub. No gallop.   Pulmonary:     Effort: Pulmonary effort is normal. No respiratory distress.     Breath sounds: Normal breath sounds. No stridor. No wheezing, rhonchi or rales.  Skin:    General: Skin is warm and dry.  Neurological:     Mental Status: He is alert and oriented to person, place, and time.     Cranial Nerves: No cranial nerve deficit.     Motor: No weakness.     Coordination: Coordination normal.     Gait: Gait normal.     Deep Tendon Reflexes: Reflexes normal.  Psychiatric:        Mood and Affect: Mood normal.        Behavior: Behavior normal.        Thought Content: Thought content normal.      Assessment and Plan :   1. Encounter for medication refill   2. Essential hypertension   3. Elevated blood pressure reading   4. Morbid obesity (HCC)     Patient does not have signs of stroke, ACS or other acute cardiovascular event related to uncontrolled hypertension.  I will refill his medications for 19-month supply given that he is in the process of trying to establish with a new PCP.  Recommendation made for new primary care provider and PA [place]. Counseled patient on potential for adverse effects with medications prescribed/recommended today, ER and return-to-clinic precautions discussed, patient verbalized understanding.    Wallis Bamberg, PA-C 02/08/19 1450

## 2019-05-09 ENCOUNTER — Ambulatory Visit (HOSPITAL_COMMUNITY)
Admission: EM | Admit: 2019-05-09 | Discharge: 2019-05-09 | Disposition: A | Discharge status: Home / Self Care | Payer: Self-pay | Attending: Emergency Medicine | Admitting: Emergency Medicine

## 2019-05-09 ENCOUNTER — Other Ambulatory Visit: Payer: Self-pay

## 2019-05-09 ENCOUNTER — Encounter (HOSPITAL_COMMUNITY): Payer: Self-pay

## 2019-05-09 DIAGNOSIS — K2100 Gastro-esophageal reflux disease with esophagitis, without bleeding: Secondary | ICD-10-CM | POA: Insufficient documentation

## 2019-05-09 DIAGNOSIS — I10 Essential (primary) hypertension: Secondary | ICD-10-CM | POA: Insufficient documentation

## 2019-05-09 LAB — BASIC METABOLIC PANEL
Anion gap: 12 (ref 5–15)
BUN: 12 mg/dL (ref 6–20)
CO2: 29 mmol/L (ref 22–32)
Calcium: 9.1 mg/dL (ref 8.9–10.3)
Chloride: 97 mmol/L — ABNORMAL LOW (ref 98–111)
Creatinine, Ser: 1.25 mg/dL — ABNORMAL HIGH (ref 0.61–1.24)
GFR calc Af Amer: >60 mL/min (ref >60)
GFR calc non Af Amer: >60 mL/min (ref >60)
Glucose, Bld: 166 mg/dL — ABNORMAL HIGH (ref 70–99)
Potassium: 3.7 mmol/L (ref 3.5–5.1)
Sodium: 138 mmol/L (ref 135–145)

## 2019-05-09 LAB — HEMOGLOBIN AND HEMATOCRIT, BLOOD
HCT: 47.9 % (ref 39.0–52.0)
Hemoglobin: 15.6 g/dL (ref 13.0–17.0)

## 2019-05-09 MED ORDER — POTASSIUM CHLORIDE ER 10 MEQ PO TBCR: 10.0000 meq | EXTENDED_RELEASE_TABLET | Freq: Every day | ORAL | 0 refills | Status: DC

## 2019-05-09 MED ORDER — PANTOPRAZOLE SODIUM 20 MG PO TBEC: 20.0000 mg | DELAYED_RELEASE_TABLET | Freq: Every day | ORAL | 0 refills | Status: DC

## 2019-05-09 MED ORDER — LISINOPRIL-HYDROCHLOROTHIAZIDE 20-25 MG PO TABS: 1.0000 | ORAL_TABLET | Freq: Every day | ORAL | 0 refills | Status: DC

## 2019-05-09 NOTE — Discharge Instructions (Addendum)
Please make a follow up appointment with your PCP and/or a new PCP if you are interested, for follow up for recheck.  You may need to be seen by Gastroenterology if these symptoms persist.  If worsening of pain, dizziness, vomiting, blood, or black in stool, please go to the ER Please start protonix daily.  Avoid ibuprofen or aleve/ naproxen

## 2019-05-09 NOTE — ED Triage Notes (Signed)
Pt presents with acid reflux (epigastric pain) X 1 week.

## 2019-05-09 NOTE — ED Provider Notes (Signed)
[place]    CSN: [id number] Arrival date & time: 05/09/19  1318      History   Chief Complaint Chief Complaint  Patient presents with  . Gastroesophageal Reflux    HPI [name] [name] is a 39 y.o. male.   [name] [name] presents with complaints of reflux symptoms, throat burning, heart burn, which is now causing nausea, vomiting. Started 1 month ago and has been worsening. Occasionally will vomit red/ burgundy colored emesis. No blood or black in stool. No other abdominal pain. Years ago took nexium but currently has not been taking any prescribed medications. Has recently started taking OTC reflux medication which hasn't helped. Requesting refills of his blood pressure medication as well. States he has had upper endoscopy in the past but was years ago. No dizziness or light headedness. History  Of htn and stroke.     ROS per HPI, negative if not otherwise mentioned.      Past Medical History:  Diagnosis Date  . Hypercholesteremia   . Hypertension   . Stroke ([place])   . Stroke [facility]) 2012    Patient Active Problem List   Diagnosis Date Noted  . Tobacco use 04/16/2018  . Morbid obesity ([place]) 04/16/2018  . Hypertension 06/02/2015    Past Surgical History:  Procedure Laterality Date  . FOOT SURGERY    . KNEE SURGERY    . ORIF RADIUS & ULNA FRACTURES    . ORIF TIBIA & FIBULA FRACTURES    . ORTHOPEDIC SURGERY    . SHOULDER SURGERY         Home Medications    Prior to Admission medications   Medication Sig Start Date End Date Taking? Authorizing Provider  Ascorbic Acid (VITAMIN C PO) Take by mouth.    [provider]  Blood Pressure Monitor DEVI Use as directed to check home blood pressure 2-3 times a week 07/19/18   Ladell Pier, MD  calcium carbonate (TUMS - DOSED IN MG ELEMENTAL CALCIUM) 500 MG chewable tablet Chew 1 tablet by mouth daily.    [provider]  clindamycin (CLEOCIN) 300 MG capsule Take 1 capsule (300  mg total) by mouth 4 (four) times daily. X 7 days 12/03/18   Palumbo, April, MD  clindamycin (CLEOCIN) 300 MG capsule Take 1 capsule (300 mg total) by mouth 4 (four) times daily. X 7 days 12/08/18   Charlesetta Shanks, MD  diclofenac sodium (VOLTAREN) 1 % GEL Apply 2 g topically 4 (four) times daily. 07/19/18   Ladell Pier, MD  HYDROcodone-acetaminophen (NORCO/VICODIN) 5-325 MG tablet Take 1-2 tablets by mouth every 6 (six) hours as needed for moderate pain or severe pain. 12/08/18   Charlesetta Shanks, MD  ibuprofen (ADVIL) 800 MG tablet Take 1 tablet (800 mg total) by mouth 3 (three) times daily. 12/03/18   Palumbo, April, MD  ibuprofen (ADVIL) 800 MG tablet Take 1 tablet (800 mg total) by mouth 3 (three) times daily. 12/08/18   Charlesetta Shanks, MD  lisinopril-hydrochlorothiazide (ZESTORETIC) 20-25 MG tablet Take 1 tablet by mouth daily. 05/09/19   Zigmund Gottron, NP  pantoprazole (PROTONIX) 20 MG tablet Take 1 tablet (20 mg total) by mouth daily. 05/09/19   Zigmund Gottron, NP  potassium chloride (KLOR-CON) 10 MEQ tablet Take 1 tablet (10 mEq total) by mouth daily. 05/09/19   Zigmund Gottron, NP    Family History Family History  Problem Relation Age of Onset  . Diabetes Mother     Social  History Social History   Tobacco Use  . Smoking status: Light Tobacco Smoker    Types: Cigars  . Smokeless tobacco: Never Used  Substance Use Topics  . Alcohol use: Yes    Comment: occasionally  . Drug use: No     Allergies   Tea   Review of Systems Review of Systems   Physical Exam Triage Vital Signs ED Triage Vitals  Enc Vitals Group     BP 05/09/19 1333 (!) 157/97     Pulse Rate 05/09/19 1333 78     Resp 05/09/19 1333 18     Temp 05/09/19 1333 98.1 F (36.7 C)     Temp Source 05/09/19 1333 Oral     SpO2 05/09/19 1333 99 %     Weight --      Height --      Head Circumference --      Peak Flow --      Pain Score 05/09/19 1331 4     Pain Loc --      Pain Edu? --      Excl.  in [place]? --    No data found.  Updated Vital Signs BP (!) 157/97 (BP Location: Right Arm)   Pulse 78   Temp 98.1 F (36.7 C) (Oral)   Resp 18   SpO2 99%    Physical Exam Constitutional:      Appearance: He is well-developed.  Cardiovascular:     Rate and Rhythm: Normal rate and regular rhythm.  Pulmonary:     Effort: Pulmonary effort is normal.     Breath sounds: Normal breath sounds.  Abdominal:     Tenderness: There is no abdominal tenderness.  Skin:    General: Skin is warm and dry.  Neurological:     Mental Status: He is alert and oriented to person, place, and time.     EKG:  NSR rate of 83 . Previous EKG was available for review. No stwave changes as interpreted by me.   UC Treatments / Results  Labs (all labs ordered are listed, but only abnormal results are displayed) Labs Reviewed  BASIC METABOLIC PANEL - Abnormal; Notable for the following components:      Result Value   Chloride 97 (*)    Glucose, Bld 166 (*)    Creatinine, Ser 1.25 (*)    All other components within normal limits  HEMOGLOBIN AND HEMATOCRIT, BLOOD    EKG   Radiology No results found.  Procedures Procedures (including critical care time)  Medications Ordered in UC Medications - No data to display  Initial Impression / Assessment and Plan / UC Course  I have reviewed the triage vital signs and the nursing notes.  Pertinent labs & imaging results that were available during my care of the patient were reviewed by me and considered in my medical decision making (see chart for details).     Reflux with concern for esophagitis, potentially causing hematemesis. protonix initiated. ekg reassuring. H&h stable. Medications refilled. Noted some elevation in creatinine as well as in glucose, patient to be notified by follow up team of these to emphasize follow up with PCP for recheck. May need GI referral if persistent symptoms. Encouraged to avoid NSAIds and diet recommendations discussed.  Return precautions provided. Patient verbalized understanding and agreeable to plan. . Final Clinical Impressions(s) / UC Diagnoses   Final diagnoses:  Gastroesophageal reflux disease with esophagitis, unspecified whether hemorrhage     Discharge Instructions  Please make a follow up appointment with your PCP and/or a new PCP if you are interested, for follow up for recheck.  You may need to be seen by Gastroenterology if these symptoms persist.  If worsening of pain, dizziness, vomiting, blood, or black in stool, please go to the ER Please start protonix daily.  Avoid ibuprofen or aleve/ naproxen   ED Prescriptions    Medication Sig Dispense Auth. Provider   lisinopril-hydrochlorothiazide (ZESTORETIC) 20-25 MG tablet Take 1 tablet by mouth daily. 30 tablet Linus Mako B, NP   potassium chloride (KLOR-CON) 10 MEQ tablet Take 1 tablet (10 mEq total) by mouth daily. 30 tablet Linus Mako B, NP   pantoprazole (PROTONIX) 20 MG tablet Take 1 tablet (20 mg total) by mouth daily. 30 tablet Georgetta Haber, NP     PDMP not reviewed this encounter.   Georgetta Haber, NP 05/09/19 2040

## 2019-06-06 ENCOUNTER — Other Ambulatory Visit: Payer: Self-pay

## 2019-06-06 ENCOUNTER — Ambulatory Visit (HOSPITAL_COMMUNITY)
Admission: EM | Admit: 2019-06-06 | Discharge: 2019-06-06 | Disposition: A | Discharge status: Home / Self Care | Payer: Self-pay | Attending: Family Medicine | Admitting: Family Medicine

## 2019-06-06 ENCOUNTER — Ambulatory Visit (INDEPENDENT_AMBULATORY_CARE_PROVIDER_SITE_OTHER): Payer: Self-pay

## 2019-06-06 ENCOUNTER — Encounter (HOSPITAL_COMMUNITY): Payer: Self-pay

## 2019-06-06 DIAGNOSIS — M79642 Pain in left hand: Secondary | ICD-10-CM

## 2019-06-06 DIAGNOSIS — I10 Essential (primary) hypertension: Secondary | ICD-10-CM

## 2019-06-06 DIAGNOSIS — Z760 Encounter for issue of repeat prescription: Secondary | ICD-10-CM

## 2019-06-06 DIAGNOSIS — S6992XA Unspecified injury of left wrist, hand and finger(s), initial encounter: Secondary | ICD-10-CM

## 2019-06-06 MED ORDER — LISINOPRIL-HYDROCHLOROTHIAZIDE 20-25 MG PO TABS: 1.0000 | ORAL_TABLET | Freq: Every day | ORAL | 3 refills | Status: DC

## 2019-06-06 MED ORDER — POTASSIUM CHLORIDE ER 10 MEQ PO TBCR: 10.0000 meq | EXTENDED_RELEASE_TABLET | Freq: Every day | ORAL | 3 refills | Status: DC

## 2019-06-06 NOTE — Discharge Instructions (Signed)
Your x-ray is normal.  Most likely just strained some ligaments.  We will put you in a thumb spica splint to help stabilize the area.  He can rest, ice and do ibuprofen as needed for pain.  Refills given on blood pressure medication and contact given for primary care at [facility] to follow-up

## 2019-06-06 NOTE — ED Triage Notes (Signed)
Pt c/o L thumb pain after falling and catching self with hand 2 days ago.   Also requesting BP med refill, ran out yesterday

## 2019-06-09 NOTE — ED Provider Notes (Signed)
[facility]    CSN: [id number] Arrival date & time: 06/06/19  1325      History   Chief Complaint Chief Complaint  Patient presents with  . Hand Pain  . Medication Refill    HPI [name] [name] is a 39 y.o. male.   Patient is a 39 year old male with past medical history of hypercholesterolemia, hypertension, stroke.  He resents today with left thumb pain after falling and catching himself with his hand 2 days ago.  The pain has somewhat improved.  Swelling has improved.  No numbness, tingling or radiation of pain.  No bruising, swelling or deformities.  Patient also requesting refill blood pressure medication.  Last dose was today     Past Medical History:  Diagnosis Date  . Hypercholesteremia   . Hypertension   . Stroke (HCC)   . Stroke [facility]) 2012    Patient Active Problem List   Diagnosis Date Noted  . Tobacco use 04/16/2018  . Morbid obesity (HCC) 04/16/2018  . Hypertension 06/02/2015    Past Surgical History:  Procedure Laterality Date  . FOOT SURGERY    . KNEE SURGERY    . ORIF RADIUS & ULNA FRACTURES    . ORIF TIBIA & FIBULA FRACTURES    . ORTHOPEDIC SURGERY    . SHOULDER SURGERY         Home Medications    Prior to Admission medications   Medication Sig Start Date End Date Taking? Authorizing Provider  Ascorbic Acid (VITAMIN C PO) Take by mouth.    [provider]  Blood Pressure Monitor DEVI Use as directed to check home blood pressure 2-3 times a week 07/19/18   Marcine Matar, MD  calcium carbonate (TUMS - DOSED IN MG ELEMENTAL CALCIUM) 500 MG chewable tablet Chew 1 tablet by mouth daily.    [provider]  diclofenac sodium (VOLTAREN) 1 % GEL Apply 2 g topically 4 (four) times daily. 07/19/18   Marcine Matar, MD  HYDROcodone-acetaminophen (NORCO/VICODIN) 5-325 MG tablet Take 1-2 tablets by mouth every 6 (six) hours as needed for moderate pain or severe pain. Patient taking differently: Take 1-2 tablets by  mouth every 6 (six) hours as needed for moderate pain or severe pain. Pt out of medication 12/08/18   Arby Barrette, MD  ibuprofen (ADVIL) 800 MG tablet Take 1 tablet (800 mg total) by mouth 3 (three) times daily. 12/03/18   Palumbo, April, MD  ibuprofen (ADVIL) 800 MG tablet Take 1 tablet (800 mg total) by mouth 3 (three) times daily. 12/08/18   Arby Barrette, MD  lisinopril-hydrochlorothiazide (ZESTORETIC) 20-25 MG tablet Take 1 tablet by mouth daily. 06/06/19   Dahlia Byes A, NP  pantoprazole (PROTONIX) 20 MG tablet Take 1 tablet (20 mg total) by mouth daily. 05/09/19   Georgetta Haber, NP  potassium chloride (KLOR-CON) 10 MEQ tablet Take 1 tablet (10 mEq total) by mouth daily. 06/06/19   Janace Aris, NP    Family History Family History  Problem Relation Age of Onset  . Diabetes Mother     Social History Social History   Tobacco Use  . Smoking status: Light Tobacco Smoker    Types: Cigars  . Smokeless tobacco: Never Used  Substance Use Topics  . Alcohol use: Yes    Comment: occasionally  . Drug use: No     Allergies   Tea   Review of Systems Review of Systems   Physical Exam Triage Vital Signs ED Triage Vitals [  06/06/19 1440]  Enc Vitals Group     BP 138/82     Pulse Rate 81     Resp 16     Temp 98 F (36.7 C)     Temp src      SpO2 96 %     Weight      Height      Head Circumference      Peak Flow      Pain Score 7     Pain Loc      Pain Edu?      Excl. in [place]?    No data found.  Updated Vital Signs BP 138/82   Pulse 81   Temp 98 F (36.7 C)   Resp 16   SpO2 96%   Visual Acuity Right Eye Distance:   Left Eye Distance:   Bilateral Distance:    Right Eye Near:   Left Eye Near:    Bilateral Near:     Physical Exam Vitals and nursing note reviewed.  Constitutional:      Appearance: Normal appearance.  HENT:     Head: Normocephalic and atraumatic.     Nose: Nose normal.  Eyes:     Conjunctiva/sclera: Conjunctivae normal.    Pulmonary:     Effort: Pulmonary effort is normal.  Abdominal:     Palpations: Abdomen is soft.     Tenderness: There is no abdominal tenderness.  Musculoskeletal:        General: Normal range of motion.       Hands:     Cervical back: Normal range of motion.     Comments: Mildly TTP No swelling Normal ROM.   Skin:    General: Skin is warm and dry.  Neurological:     Mental Status: He is alert.  Psychiatric:        Mood and Affect: Mood normal.      UC Treatments / Results  Labs (all labs ordered are listed, but only abnormal results are displayed) Labs Reviewed - No data to display  EKG   Radiology No results found.  Procedures Procedures (including critical care time)  Medications Ordered in UC Medications - No data to display  Initial Impression / Assessment and Plan / UC Course  I have reviewed the triage vital signs and the nursing notes.  Pertinent labs & imaging results that were available during my care of the patient were reviewed by me and considered in my medical decision making (see chart for details).    Hand injury X-ray without any acute findings. Most likely bad strain. We will have him wear a thumb spica splint to help stabilize the joint and promote faster healing. Rest, ice Ibuprofen as needed for pain  Medication refill Refilled blood pressure medication.  Patient given contact for primary care [facility] for follow-up and blood pressure management.  Final Clinical Impressions(s) / UC Diagnoses   Final diagnoses:  Hand injury, left, initial encounter  Encounter for medication refill     Discharge Instructions     Your x-ray is normal.  Most likely just strained some ligaments.  We will put you in a thumb spica splint to help stabilize the area.  He can rest, ice and do ibuprofen as needed for pain.  Refills given on blood pressure medication and contact given for primary care at [facility] to follow-up    ED  Prescriptions    Medication Sig Dispense Auth. Provider   lisinopril-hydrochlorothiazide (ZESTORETIC) 20-25 MG  tablet Take 1 tablet by mouth daily. 30 tablet Genia Perin A, NP   potassium chloride (KLOR-CON) 10 MEQ tablet Take 1 tablet (10 mEq total) by mouth daily. 30 tablet Dahlia Byes A, NP     PDMP not reviewed this encounter.   Janace Aris, NP 06/09/19 [phone]

## 2019-08-14 ENCOUNTER — Emergency Department (HOSPITAL_COMMUNITY): Payer: Self-pay

## 2019-08-14 ENCOUNTER — Emergency Department (HOSPITAL_COMMUNITY)
Admission: EM | Admit: 2019-08-14 | Discharge: 2019-08-14 | Disposition: A | Discharge status: Home / Self Care | Payer: Self-pay | Attending: Emergency Medicine | Admitting: Emergency Medicine

## 2019-08-14 ENCOUNTER — Encounter (HOSPITAL_COMMUNITY): Payer: Self-pay

## 2019-08-14 ENCOUNTER — Other Ambulatory Visit: Payer: Self-pay

## 2019-08-14 DIAGNOSIS — Y998 Other external cause status: Secondary | ICD-10-CM | POA: Insufficient documentation

## 2019-08-14 DIAGNOSIS — W010XXA Fall on same level from slipping, tripping and stumbling without subsequent striking against object, initial encounter: Secondary | ICD-10-CM | POA: Insufficient documentation

## 2019-08-14 DIAGNOSIS — Y9389 Activity, other specified: Secondary | ICD-10-CM | POA: Insufficient documentation

## 2019-08-14 DIAGNOSIS — F17290 Nicotine dependence, other tobacco product, uncomplicated: Secondary | ICD-10-CM | POA: Insufficient documentation

## 2019-08-14 DIAGNOSIS — S43004A Unspecified dislocation of right shoulder joint, initial encounter: Secondary | ICD-10-CM | POA: Insufficient documentation

## 2019-08-14 DIAGNOSIS — W19XXXA Unspecified fall, initial encounter: Secondary | ICD-10-CM

## 2019-08-14 DIAGNOSIS — Z8673 Personal history of transient ischemic attack (TIA), and cerebral infarction without residual deficits: Secondary | ICD-10-CM | POA: Insufficient documentation

## 2019-08-14 DIAGNOSIS — I10 Essential (primary) hypertension: Secondary | ICD-10-CM | POA: Insufficient documentation

## 2019-08-14 DIAGNOSIS — Y9289 Other specified places as the place of occurrence of the external cause: Secondary | ICD-10-CM | POA: Insufficient documentation

## 2019-08-14 MED ORDER — TRAMADOL HCL 50 MG PO TABS
50.0000 mg | ORAL_TABLET | Freq: Once | ORAL | Status: AC
  Administered 2019-08-14: 50 mg via ORAL
  Filled 2019-08-14: qty 1

## 2019-08-14 MED ORDER — LABETALOL HCL 5 MG/ML IV SOLN
INTRAVENOUS | Status: AC
  Filled 2019-08-14: qty 4

## 2019-08-14 MED ORDER — LISINOPRIL-HYDROCHLOROTHIAZIDE 20-25 MG PO TABS: 1.0000 | ORAL_TABLET | Freq: Every day | ORAL | 3 refills | Status: DC

## 2019-08-14 MED ORDER — HYDROMORPHONE HCL 1 MG/ML IJ SOLN
INTRAMUSCULAR | Status: AC
  Filled 2019-08-14: qty 1

## 2019-08-14 MED ORDER — KETOROLAC TROMETHAMINE 30 MG/ML IJ SOLN
30.0000 mg | Freq: Once | INTRAMUSCULAR | Status: AC
  Administered 2019-08-14: 30 mg via INTRAMUSCULAR
  Filled 2019-08-14: qty 1

## 2019-08-14 NOTE — ED Triage Notes (Signed)
Patient arrived stating that he fell today around 545 pm and dislocated his right shoulder, reports placing it himself and now having difficultly moving arm. Declines taking anything for pain prior to arrival.

## 2019-08-14 NOTE — Discharge Instructions (Signed)
Thank you for allowing me to care for you today in the Emergency Department.   You were seen today for a fall and suspected dislocation of your right shoulder, which was reduced prior to coming to the ER.  The x-ray of your right shoulder was unremarkable.  Please wear the shoulder immobilizer constantly until you can be seen by Dr. Rennis Chris in the office.  After shoulder dislocation, it is possible that you can redislocate your shoulder due to laxity of the muscles around the joint.  I would recommend calling his office tomorrow to schedule a follow-up appointment.  Take 650 mg of Tylenol or 600 mg of ibuprofen with food every 6 hours for pain.  You can alternate between these 2 medications every 3 hours if your pain returns.  For instance, you can take Tylenol at noon, followed by a dose of ibuprofen at 3, followed by second dose of Tylenol and 6.  Apply an ice pack for 15 to 20 minutes up to 3-5 times a day to areas that are sore.   I have called in a refill of your home lisinopril hydrochlorothiazide.  Please follow-up with your primary care provider in the office for future refills.  Return to the emergency department if your shoulder dislocates again, if you develop new numbness, if your fingers turn blue, if you develop numbness or weakness in your left arm, or other new, concerning symptoms.

## 2019-08-14 NOTE — ED Provider Notes (Signed)
[facility]-EMERGENCY DEPT Provider Note   CSN: [id number] Arrival date & time: 08/14/19  0174     History Chief Complaint  Patient presents with  . Shoulder Pain    [name] [name] is a 39 y.o. male with a history of CVA, HTN, hyperlipidemia who presents the emergency department with a chief complaint of fall.  The patient reports that he tripped over his dog at approximately 17:45 and fell onto his right side.  He denies hitting his head, LOC, nausea, or vomiting.  Reports that his right shoulder popped out of place for approximately 45 seconds before reducing spontaneously.  He denies any other complaints related to the fall.  He has continued to have some pain in his right shoulder that is worse with movement of his arm.  He also feels as if his grip strength is decreased.  He denies numbness, weakness, right elbow or breast pain, neck or back pain, chest pain, pain in the right lower extremity.  No treatment prior to arrival.  Reports a history of a previous right shoulder dislocation when he played football in high school.  He is also requesting a refill of his home lisinopril hydrochlorothiazide.  He states that he takes tramadol for chronic pain in his knees and is out of the medication because he reported PCP and is requesting a refill.  He is established with Dr. Rennis Chris, orthopedic surgery.  The history is provided by the patient. No language interpreter was used.       Past Medical History:  Diagnosis Date  . Hypercholesteremia   . Hypertension   . Stroke (HCC)   . Stroke [facility]) 2012    Patient Active Problem List   Diagnosis Date Noted  . Tobacco use 04/16/2018  . Morbid obesity (HCC) 04/16/2018  . Hypertension 06/02/2015    Past Surgical History:  Procedure Laterality Date  . FOOT SURGERY    . KNEE SURGERY    . ORIF RADIUS & ULNA FRACTURES    . ORIF TIBIA & FIBULA FRACTURES    . ORTHOPEDIC SURGERY    . SHOULDER SURGERY          Family History  Problem Relation Age of Onset  . Diabetes Mother     Social History   Tobacco Use  . Smoking status: Light Tobacco Smoker    Types: Cigars  . Smokeless tobacco: Never Used  Vaping Use  . Vaping Use: Never used  Substance Use Topics  . Alcohol use: Yes    Comment: occasionally  . Drug use: No    Home Medications Prior to Admission medications   Medication Sig Start Date End Date Taking? Authorizing Provider  Ascorbic Acid (VITAMIN C PO) Take by mouth.    [provider]  Blood Pressure Monitor DEVI Use as directed to check home blood pressure 2-3 times a week 07/19/18   Marcine Matar, MD  calcium carbonate (TUMS - DOSED IN MG ELEMENTAL CALCIUM) 500 MG chewable tablet Chew 1 tablet by mouth daily.    [provider]  diclofenac sodium (VOLTAREN) 1 % GEL Apply 2 g topically 4 (four) times daily. 07/19/18   Marcine Matar, MD  ibuprofen (ADVIL) 800 MG tablet Take 1 tablet (800 mg total) by mouth 3 (three) times daily. 12/03/18   Palumbo, April, MD  ibuprofen (ADVIL) 800 MG tablet Take 1 tablet (800 mg total) by mouth 3 (three) times daily. 12/08/18   Arby Barrette, MD  lisinopril-hydrochlorothiazide (ZESTORETIC) 20-25 MG  tablet Take 1 tablet by mouth daily. 08/14/19   Galilee Pierron A, PA-C  pantoprazole (PROTONIX) 20 MG tablet Take 1 tablet (20 mg total) by mouth daily. 05/09/19   Georgetta Haber, NP  potassium chloride (KLOR-CON) 10 MEQ tablet Take 1 tablet (10 mEq total) by mouth daily. 06/06/19   Janace Aris, NP    Allergies    Tea  Review of Systems   Review of Systems  Constitutional: Negative for appetite change, chills and fever.  Respiratory: Negative for shortness of breath.   Cardiovascular: Negative for chest pain.  Gastrointestinal: Negative for abdominal pain, diarrhea, nausea and vomiting.  Genitourinary: Negative for dysuria.  Musculoskeletal: Positive for arthralgias and myalgias. Negative for back pain, gait  problem, neck pain and neck stiffness.  Skin: Negative for rash.  Allergic/Immunologic: Negative for immunocompromised state.  Neurological: Negative for dizziness, syncope, weakness, numbness and headaches.  Psychiatric/Behavioral: Negative for confusion.    Physical Exam Updated Vital Signs BP (!) 146/101 (BP Location: Left Arm)   Pulse 88   Temp 98.6 F (37 C) (Oral)   Resp 17   Ht 6' (1.829 m)   Wt (!) 181.4 kg   SpO2 94%   BMI 54.25 kg/m   Physical Exam Vitals and nursing note reviewed.  Constitutional:      Appearance: He is well-developed. He is not ill-appearing or toxic-appearing.     Comments: NAD.  HENT:     Head: Normocephalic.  Eyes:     Conjunctiva/sclera: Conjunctivae normal.  Cardiovascular:     Rate and Rhythm: Normal rate and regular rhythm.     Heart sounds: No murmur heard.   Pulmonary:     Effort: Pulmonary effort is normal. No respiratory distress.     Breath sounds: No stridor. No wheezing, rhonchi or rales.  Chest:     Chest wall: No tenderness.  Abdominal:     General: There is no distension.     Palpations: Abdomen is soft.  Musculoskeletal:     Cervical back: Neck supple.     Comments: Cervical, thoracic, and lumbar spinous processes and bilateral paraspinal muscles are nontender without crepitus or step-offs.  Diffuse tenderness to palpation to the right shoulder.  No obvious deformity.  Range of motion deferred.  No tenderness to the right elbow or wrist and full active and passive range of motion is intact.  Sensation is intact and equal to the bilateral upper extremities.  Grip strength is intact and equal bilaterally.   Ambulatory without difficulty.  Skin:    General: Skin is warm and dry.  Neurological:     Mental Status: He is alert.  Psychiatric:        Behavior: Behavior normal.     ED Results / Procedures / Treatments   Labs (all labs ordered are listed, but only abnormal results are displayed) Labs Reviewed - No data  to display  EKG None  Radiology DG Shoulder Right  Result Date: 08/14/2019 CLINICAL DATA:  Initial evaluation for acute pain, recent shoulder dislocation. EXAM: RIGHT SHOULDER - 2+ VIEW COMPARISON:  Prior radiograph from 12/11/2012. FINDINGS: Humeral head in normal alignment with the glenoid. No acute fracture dislocation. AC joint approximated. No periarticular calcification or other soft tissue abnormality. Visualized right hemithorax clear. IMPRESSION: No acute osseous abnormality about the right shoulder. No fracture or dislocation. Electronically Signed   By: Rise Mu M.D.   On: 08/14/2019 03:09    Procedures Procedures (including critical care time)  Medications Ordered  in ED Medications  traMADol (ULTRAM) tablet 50 mg (50 mg Oral Given 08/14/19 0406)  ketorolac (TORADOL) 30 MG/ML injection 30 mg (30 mg Intramuscular Given 08/14/19 0406)    ED Course  I have reviewed the triage vital signs and the nursing notes.  Pertinent labs & imaging results that were available during my care of the patient were reviewed by me and considered in my medical decision making (see chart for details).    MDM Rules/Calculators/A&P                          39 year old male with a history of CVA, HTN, hyperlipidemia presented to the ER after he tripped and fell from a standing earlier tonight.  After he fell, he reports that he dislocated his right shoulder, but it spontaneously reduced within 1 minute.  He has a history of a prior right shoulder dislocation secondary to a football injury in high school.  He is also requesting a refill of his home medications, which he states are lisinopril hydrochlorothiazide and tramadol.  I have reviewed the [place] PMP and did not see a prescription for tramadol.  Discussed that based on the [place] Stop Act that medications for chronic injuries must be refilled by primary care.  However, I will give him a dose in the ER.  He has also been given a refill of  his home lisinopril hydrochlorothiazide.  X-ray of the right shoulder has been reviewed by me and is negative for fracture or dislocation.  His neurovascular intact on exam.  He has no other injuries related to his fall and did not hit his head or have a loss of consciousness.  We will place the patient in a shoulder immobilizer and recommended RICE therapy and to follow-up with orthopedic surgery.  ER return precautions given.  He is hemodynamically stable in no acute distress.  Safe for discharge home with outpatient follow-up.  Final Clinical Impression(s) / ED Diagnoses Final diagnoses:  Fall from standing, initial encounter  Closed dislocation of right shoulder, initial encounter    Rx / DC Orders ED Discharge Orders         Ordered    lisinopril-hydrochlorothiazide (ZESTORETIC) 20-25 MG tablet  Daily     Discontinue  Reprint    Note to Pharmacy: Please schedule office visit for additional refills.   08/14/19 0356           Barkley Boards, PA-C 08/14/19 0413    Shon Baton, MD 08/14/19 [phone]

## 2019-08-20 ENCOUNTER — Telehealth: Payer: Self-pay | Admitting: Internal Medicine

## 2019-08-20 NOTE — Telephone Encounter (Signed)
Copied from CRM [id number]. Topic: Medical Record Request - Other >> Aug 13, 2019  9:44 AM Leafy Ro wrote: Patient Name/DOB/MRN #:Requestor Name/Agency: lea with [employer] is calling Call Back #: [phone] Information Requested: last 5 years of records , office, labs, ekg , imaging  etc excluding phyiscal therapy notes. Lea said she spoke with haley >> Aug 20, 2019 10:31 AM Claudia Desanctis E wrote: Will contact office requesting for for records. In the need of a request form, unable to do verbal medical release.   Called cb# and spoke with Dia. Dia was informed that the records were sent over 6.30.2021  And she verified that it was sent to the correct fax number. Dia then requested for the records to be resent to same fax number. Rep did so and the fax delivered at 1035.

## 2019-10-10 ENCOUNTER — Other Ambulatory Visit: Payer: Self-pay

## 2019-10-10 ENCOUNTER — Emergency Department (HOSPITAL_BASED_OUTPATIENT_CLINIC_OR_DEPARTMENT_OTHER): Payer: Self-pay

## 2019-10-10 ENCOUNTER — Encounter (HOSPITAL_BASED_OUTPATIENT_CLINIC_OR_DEPARTMENT_OTHER): Payer: Self-pay | Admitting: Emergency Medicine

## 2019-10-10 ENCOUNTER — Ambulatory Visit (HOSPITAL_COMMUNITY)
Admission: EM | Admit: 2019-10-10 | Discharge: 2019-10-10 | Disposition: A | Discharge status: Home / Self Care | Payer: Self-pay

## 2019-10-10 ENCOUNTER — Emergency Department (HOSPITAL_BASED_OUTPATIENT_CLINIC_OR_DEPARTMENT_OTHER)
Admission: EM | Admit: 2019-10-10 | Discharge: 2019-10-10 | Disposition: A | Discharge status: Home / Self Care | Payer: Self-pay | Attending: Emergency Medicine | Admitting: Emergency Medicine

## 2019-10-10 ENCOUNTER — Encounter (HOSPITAL_COMMUNITY): Payer: Self-pay

## 2019-10-10 DIAGNOSIS — F17290 Nicotine dependence, other tobacco product, uncomplicated: Secondary | ICD-10-CM | POA: Insufficient documentation

## 2019-10-10 DIAGNOSIS — I10 Essential (primary) hypertension: Secondary | ICD-10-CM | POA: Insufficient documentation

## 2019-10-10 DIAGNOSIS — G43809 Other migraine, not intractable, without status migrainosus: Secondary | ICD-10-CM | POA: Insufficient documentation

## 2019-10-10 DIAGNOSIS — R079 Chest pain, unspecified: Secondary | ICD-10-CM

## 2019-10-10 LAB — CBC
HCT: 44.8 % (ref 39.0–52.0)
Hemoglobin: 14.8 g/dL (ref 13.0–17.0)
MCH: 28.1 pg (ref 26.0–34.0)
MCHC: 33 g/dL (ref 30.0–36.0)
MCV: 85 fL (ref 80.0–100.0)
Platelets: 249 10*3/uL (ref 150–400)
RBC: 5.27 MIL/uL (ref 4.22–5.81)
RDW: 13.6 % (ref 11.5–15.5)
WBC: 9 10*3/uL (ref 4.0–10.5)
nRBC: 0 % (ref 0.0–0.2)

## 2019-10-10 LAB — TROPONIN I (HIGH SENSITIVITY)
Troponin I (High Sensitivity): 4 ng/L (ref <18)
Troponin I (High Sensitivity): 4 ng/L (ref <18)

## 2019-10-10 LAB — BASIC METABOLIC PANEL
Anion gap: 12 (ref 5–15)
BUN: 9 mg/dL (ref 6–20)
CO2: 24 mmol/L (ref 22–32)
Calcium: 8.9 mg/dL (ref 8.9–10.3)
Chloride: 104 mmol/L (ref 98–111)
Creatinine, Ser: 0.97 mg/dL (ref 0.61–1.24)
GFR calc Af Amer: >60 mL/min (ref >60)
GFR calc non Af Amer: >60 mL/min (ref >60)
Glucose, Bld: 176 mg/dL — ABNORMAL HIGH (ref 70–99)
Potassium: 3.6 mmol/L (ref 3.5–5.1)
Sodium: 140 mmol/L (ref 135–145)

## 2019-10-10 MED ORDER — PROCHLORPERAZINE EDISYLATE 10 MG/2ML IJ SOLN
10.0000 mg | Freq: Once | INTRAMUSCULAR | Status: AC
  Administered 2019-10-10: 10 mg via INTRAVENOUS
  Filled 2019-10-10: qty 2

## 2019-10-10 MED ORDER — KETOROLAC TROMETHAMINE 15 MG/ML IJ SOLN
15.0000 mg | Freq: Once | INTRAMUSCULAR | Status: AC
  Administered 2019-10-10: 15 mg via INTRAVENOUS
  Filled 2019-10-10: qty 1

## 2019-10-10 MED ORDER — LISINOPRIL-HYDROCHLOROTHIAZIDE 20-25 MG PO TABS: 1.0000 | ORAL_TABLET | Freq: Every day | ORAL | 2 refills | Status: DC

## 2019-10-10 MED ORDER — SODIUM CHLORIDE 0.9 % IV BOLUS
1000.0000 mL | Freq: Once | INTRAVENOUS | Status: AC
  Administered 2019-10-10: 1000 mL via INTRAVENOUS

## 2019-10-10 MED ORDER — DIPHENHYDRAMINE HCL 50 MG/ML IJ SOLN
25.0000 mg | Freq: Once | INTRAMUSCULAR | Status: AC
  Administered 2019-10-10: 25 mg via INTRAVENOUS
  Filled 2019-10-10: qty 1

## 2019-10-10 NOTE — Discharge Instructions (Addendum)
You were evaluated in the Emergency Department and after careful evaluation, we did not find any emergent condition requiring admission or further testing in the hospital.  Your exam/testing today was overall reassuring.  Your symptoms seem to be due to a migraine.  Please return to the Emergency Department if you experience any worsening of your condition.  Thank you for allowing [place] to be a part of your care.

## 2019-10-10 NOTE — ED Notes (Signed)
Pt requesting IV out.

## 2019-10-10 NOTE — ED Provider Notes (Signed)
[facility]-EMERGENCY DEPT [facility] Emergency Department Provider Note MRN:  [id number]  Arrival date & time: 10/10/19     Chief Complaint   Chest Pain and Headache   History of Present Illness   [name] [name] is a 39 y.o. year-old male with a history of hypertension, stroke presenting to the ED with chief complaint of headache.  Gradual onset headache 2 or 3 days ago.  Similar to prior migraines.  Explains that he gets a headache twice monthly.  Located on the right forehead region, worse with bright lights and loud noises, currently moderate in severity.  Also felt some chest tightness today and expressed these symptoms to urgent care doctor today, who did EKG and sent him to emergency department.  Patient explains that he belched and then his chest pain went away.  No pain in the chest since this morning.  Headache continues, single episode vomiting this morning, no numbness or weakness to the arms or legs.  Review of Systems  A complete 10 system review of systems was obtained and all systems are negative except as noted in the HPI and PMH.   Patient's Health History    Past Medical History:  Diagnosis Date  . Hypercholesteremia   . Hypertension   . Stroke (HCC)   . Stroke [facility]) 2012    Past Surgical History:  Procedure Laterality Date  . FOOT SURGERY    . KNEE SURGERY    . ORIF RADIUS & ULNA FRACTURES    . ORIF TIBIA & FIBULA FRACTURES    . ORTHOPEDIC SURGERY    . SHOULDER SURGERY      Family History  Problem Relation Age of Onset  . Diabetes Mother   . Healthy Father     Social History   Socioeconomic History  . Marital status: Single    Spouse name: Not on file  . Number of children: Not on file  . Years of education: Not on file  . Highest education level: Not on file  Occupational History  . Not on file  Tobacco Use  . Smoking status: Light Tobacco Smoker    Types: Cigars  . Smokeless tobacco: Never Used  Vaping Use  . Vaping Use: Never used   Substance and Sexual Activity  . Alcohol use: Yes    Comment: occasionally  . Drug use: No  . Sexual activity: Yes    Birth control/protection: None  Other Topics Concern  . Not on file  Social History Narrative  . Not on file   Social Determinants of Health   Financial Resource Strain:   . Difficulty of Paying Living Expenses: Not on file  Food Insecurity:   . Worried About Programme researcher, broadcasting/film/video in the Last Year: Not on file  . Ran Out of Food in the Last Year: Not on file  Transportation Needs:   . Lack of Transportation (Medical): Not on file  . Lack of Transportation (Non-Medical): Not on file  Physical Activity:   . Days of Exercise per Week: Not on file  . Minutes of Exercise per Session: Not on file  Stress:   . Feeling of Stress : Not on file  Social Connections:   . Frequency of Communication with Friends and Family: Not on file  . Frequency of Social Gatherings with Friends and Family: Not on file  . Attends Religious Services: Not on file  . Active Member of Clubs or Organizations: Not on file  . Attends Banker Meetings:  Not on file  . Marital Status: Not on file  Intimate Partner Violence:   . Fear of Current or Ex-Partner: Not on file  . Emotionally Abused: Not on file  . Physically Abused: Not on file  . Sexually Abused: Not on file     Physical Exam   Vitals:   10/10/19 1648 10/10/19 1941  BP: (!) 158/114 137/86  Pulse: 84 70  Resp: (!) 22 16  Temp: 98.3 F (36.8 C)   SpO2: 98% 98%    CONSTITUTIONAL: Well-appearing, NAD NEURO:  Alert and oriented x 3, normal and symmetric strength, decreased sensation to the left leg (chronic), normal speech, normal coordination EYES:  eyes equal and reactive ENT/NECK:  no LAD, no JVD CARDIO: Regular rate, well-perfused, normal S1 and S2 PULM:  CTAB no wheezing or rhonchi GI/GU:  normal bowel sounds, non-distended, non-tender MSK/SPINE:  No gross deformities, no edema SKIN:  no rash,  atraumatic PSYCH:  Appropriate speech and behavior  *Additional and/or pertinent findings included in MDM below  Diagnostic and Interventional Summary    EKG Interpretation  Date/Time:  Friday October 10 2019 16:48:54 EDT Ventricular Rate:  81 PR Interval:  142 QRS Duration: 82 QT Interval:  352 QTC Calculation: 408 R Axis:   50 Text Interpretation: Normal sinus rhythm Nonspecific T wave abnormality Abnormal ECG Confirmed by Kennis Carina [phone]) on 10/10/2019 7:16:58 PM      Labs Reviewed  BASIC METABOLIC PANEL - Abnormal; Notable for the following components:      Result Value   Glucose, Bld 176 (*)    All other components within normal limits  CBC  TROPONIN I (HIGH SENSITIVITY)  TROPONIN I (HIGH SENSITIVITY)    DG Chest 2 View  Final Result      Medications  ketorolac (TORADOL) 15 MG/ML injection 15 mg (15 mg Intravenous Given 10/10/19 1953)  diphenhydrAMINE (BENADRYL) injection 25 mg (25 mg Intravenous Given 10/10/19 1951)  prochlorperazine (COMPAZINE) injection 10 mg (10 mg Intravenous Given 10/10/19 1954)  sodium chloride 0.9 % bolus 1,000 mL ( Intravenous Stopped 10/10/19 2043)     Procedures  /  Critical Care Procedures  ED Course and Medical Decision Making  I have reviewed the triage vital signs, the nursing notes, and pertinent available records from the EMR.  Listed above are laboratory and imaging tests that I personally ordered, reviewed, and interpreted and then considered in my medical decision making (see below for details).  Atypical chest pain favoring GI in etiology, EKG is actually reassuring and largely unchanged from prior, troponin is negative.  Patient seems to be here mostly for migraine headache, no fever, no meningismus, no neurological deficits.  Does have decreased sensation to the left leg related to tib-fib fracture in the past.  Will provide migraine cocktail and reassess.     Feeling much better after migraine cocktail, appropriate for  discharge.  Elmer Sow. Pilar Plate, MD [facility] Emergency Medicine [facility] [email or web address]  Final Clinical Impressions(s) / ED Diagnoses     ICD-10-CM   1. Other migraine without status migrainosus, not intractable  G43.809     ED Discharge Orders    None       Discharge Instructions Discussed with and Provided to Patient:     Discharge Instructions     You were evaluated in the Emergency Department and after careful evaluation, we did not find any emergent condition requiring admission or further testing in the hospital.  Your exam/testing today was overall reassuring.  Your symptoms seem to be due to a migraine.  Please return to the Emergency Department if you experience any worsening of your condition.  Thank you for allowing [place] to be a part of your care.        Sabas Sous, MD 10/10/19 2215

## 2019-10-10 NOTE — ED Triage Notes (Signed)
Pt is here with a migraine and chest pain that started yesterday, pt states this has happened before in the past. Pt has taken [facility] powders to relieve discomfort.

## 2019-10-10 NOTE — ED Triage Notes (Signed)
Seen at [facility] urgent care today at 1300 for headache, was told to come right to ED because of ekg changes. C/o chest tightness. Amb. Alert.

## 2019-11-24 ENCOUNTER — Encounter (HOSPITAL_BASED_OUTPATIENT_CLINIC_OR_DEPARTMENT_OTHER): Payer: Self-pay | Admitting: Emergency Medicine

## 2019-11-24 ENCOUNTER — Other Ambulatory Visit: Payer: Self-pay

## 2019-11-24 ENCOUNTER — Emergency Department (HOSPITAL_BASED_OUTPATIENT_CLINIC_OR_DEPARTMENT_OTHER)
Admission: EM | Admit: 2019-11-24 | Discharge: 2019-11-24 | Disposition: A | Discharge status: Home / Self Care | Payer: Self-pay | Attending: Emergency Medicine | Admitting: Emergency Medicine

## 2019-11-24 DIAGNOSIS — I10 Essential (primary) hypertension: Secondary | ICD-10-CM | POA: Insufficient documentation

## 2019-11-24 DIAGNOSIS — B349 Viral infection, unspecified: Secondary | ICD-10-CM | POA: Insufficient documentation

## 2019-11-24 DIAGNOSIS — Z79899 Other long term (current) drug therapy: Secondary | ICD-10-CM | POA: Insufficient documentation

## 2019-11-24 DIAGNOSIS — Z20822 Contact with and (suspected) exposure to covid-19: Secondary | ICD-10-CM | POA: Insufficient documentation

## 2019-11-24 DIAGNOSIS — R519 Headache, unspecified: Secondary | ICD-10-CM

## 2019-11-24 DIAGNOSIS — F17290 Nicotine dependence, other tobacco product, uncomplicated: Secondary | ICD-10-CM | POA: Insufficient documentation

## 2019-11-24 LAB — RESPIRATORY PANEL BY RT PCR (FLU A&B, COVID)
Influenza A by PCR: NEGATIVE
Influenza B by PCR: NEGATIVE
SARS Coronavirus 2 by RT PCR: NEGATIVE

## 2019-11-24 LAB — GROUP A STREP BY PCR: Group A Strep by PCR: NOT DETECTED

## 2019-11-24 MED ORDER — ACETAMINOPHEN 325 MG PO TABS
650.0000 mg | ORAL_TABLET | Freq: Once | ORAL | Status: AC
  Administered 2019-11-24: 650 mg via ORAL
  Filled 2019-11-24: qty 2

## 2019-11-24 MED ORDER — SODIUM CHLORIDE 0.9 % IV BOLUS
1000.0000 mL | Freq: Once | INTRAVENOUS | Status: AC
  Administered 2019-11-24: 1000 mL via INTRAVENOUS

## 2019-11-24 MED ORDER — PANTOPRAZOLE SODIUM 20 MG PO TBEC: 20.0000 mg | DELAYED_RELEASE_TABLET | Freq: Every day | ORAL | 0 refills | Status: DC

## 2019-11-24 MED ORDER — METOCLOPRAMIDE HCL 5 MG/ML IJ SOLN
10.0000 mg | Freq: Once | INTRAMUSCULAR | Status: AC
  Administered 2019-11-24: 10 mg via INTRAVENOUS
  Filled 2019-11-24: qty 2

## 2019-11-24 MED ORDER — PROMETHAZINE HCL 25 MG PO TABS: 25.0000 mg | ORAL_TABLET | Freq: Four times a day (QID) | ORAL | 0 refills | Status: DC | PRN

## 2019-11-24 MED ORDER — DIPHENHYDRAMINE HCL 50 MG/ML IJ SOLN
25.0000 mg | Freq: Once | INTRAMUSCULAR | Status: AC
  Administered 2019-11-24: 25 mg via INTRAVENOUS
  Filled 2019-11-24: qty 1

## 2019-11-24 MED ORDER — POTASSIUM CHLORIDE ER 10 MEQ PO TBCR: 10.0000 meq | EXTENDED_RELEASE_TABLET | Freq: Every day | ORAL | 0 refills | Status: DC

## 2019-11-24 NOTE — ED Provider Notes (Signed)
[facility] EMERGENCY DEPARTMENT Provider Note   CSN: [id number] Arrival date & time: 11/24/19  1813     History Chief Complaint  Patient presents with  . Sore Throat    [name] [name] is a 39 y.o. male.  Patient with history of migraine headache presents the emergency department for evaluation of chills, body aches, sore throat, headache, decreased energy and appetite starting yesterday.  Patient denies documented fevers.  He has been having difficulty swallowing due to his sore throat which he rates at 6/10.  Headache is generalized, similar to previous migraines.  No neck pain or difficulty with movement of the neck.  No confusion or head injury.  He is sensitive to light and sound.  He has had several episodes of nausea and vomiting, no diarrhea.  No congestion or cough.  No skin rashes, painful urination.  He has been taking OTC meds at home without improvement.  No known sick contacts.  Covid test here was negative.        Past Medical History:  Diagnosis Date  . Hypercholesteremia   . Hypertension   . Stroke (HCC)   . Stroke [facility]) 2012    Patient Active Problem List   Diagnosis Date Noted  . Tobacco use 04/16/2018  . Morbid obesity (HCC) 04/16/2018  . Hypertension 06/02/2015    Past Surgical History:  Procedure Laterality Date  . FOOT SURGERY    . KNEE SURGERY    . ORIF RADIUS & ULNA FRACTURES    . ORIF TIBIA & FIBULA FRACTURES    . ORTHOPEDIC SURGERY    . SHOULDER SURGERY         Family History  Problem Relation Age of Onset  . Diabetes Mother   . Healthy Father     Social History   Tobacco Use  . Smoking status: Light Tobacco Smoker    Types: Cigars  . Smokeless tobacco: Never Used  Vaping Use  . Vaping Use: Never used  Substance Use Topics  . Alcohol use: Yes    Comment: occasionally  . Drug use: No    Home Medications Prior to Admission medications   Medication Sig Start Date End Date Taking? Authorizing Provider    Ascorbic Acid (VITAMIN C PO) Take by mouth.    [provider]  Blood Pressure Monitor DEVI Use as directed to check home blood pressure 2-3 times a week 07/19/18   Marcine Matar, MD  calcium carbonate (TUMS - DOSED IN MG ELEMENTAL CALCIUM) 500 MG chewable tablet Chew 1 tablet by mouth daily.    [provider]  diclofenac sodium (VOLTAREN) 1 % GEL Apply 2 g topically 4 (four) times daily. 07/19/18   Marcine Matar, MD  ibuprofen (ADVIL) 800 MG tablet Take 1 tablet (800 mg total) by mouth 3 (three) times daily. 12/03/18   Palumbo, April, MD  ibuprofen (ADVIL) 800 MG tablet Take 1 tablet (800 mg total) by mouth 3 (three) times daily. 12/08/18   Arby Barrette, MD  lisinopril-hydrochlorothiazide (ZESTORETIC) 20-25 MG tablet Take 1 tablet by mouth daily. 10/10/19   Sabas Sous, MD  pantoprazole (PROTONIX) 20 MG tablet Take 1 tablet (20 mg total) by mouth daily. 05/09/19   Georgetta Haber, NP  potassium chloride (KLOR-CON) 10 MEQ tablet Take 1 tablet (10 mEq total) by mouth daily. 06/06/19   Janace Aris, NP    Allergies    Tea  Review of Systems   Review of Systems  Constitutional: Positive  for appetite change, chills and fatigue. Negative for fever.  HENT: Positive for sore throat. Negative for congestion, dental problem, ear pain, rhinorrhea and sinus pressure.   Eyes: Positive for photophobia. Negative for discharge, redness and visual disturbance.  Respiratory: Negative for cough, shortness of breath (denies to me) and wheezing.   Cardiovascular: Negative for chest pain.  Gastrointestinal: Positive for nausea and vomiting. Negative for abdominal pain and diarrhea.  Genitourinary: Negative for dysuria.  Musculoskeletal: Positive for myalgias. Negative for gait problem, neck pain and neck stiffness.  Skin: Negative for rash.  Neurological: Positive for headaches. Negative for syncope, speech difficulty, weakness, light-headedness and numbness.  Hematological:  Negative for adenopathy.  Psychiatric/Behavioral: Negative for confusion.    Physical Exam Updated Vital Signs BP (!) 155/105   Pulse 79   Temp 98.8 F (37.1 C) (Oral)   Resp 18   Ht 6\' 4"  (1.93 m)   Wt (!) 183.7 kg   SpO2 97%   BMI 49.30 kg/m   Physical Exam Vitals and nursing note reviewed.  Constitutional:      Appearance: He is well-developed.  HENT:     Head: Normocephalic and atraumatic.     Right Ear: Tympanic membrane, ear canal and external ear normal.     Left Ear: Tympanic membrane, ear canal and external ear normal.     Nose: Nose normal.     Mouth/Throat:     Pharynx: Uvula midline. Pharyngeal swelling, oropharyngeal exudate and posterior oropharyngeal erythema present.  Eyes:     General: Lids are normal.     Conjunctiva/sclera: Conjunctivae normal.     Pupils: Pupils are equal, round, and reactive to light.  Cardiovascular:     Rate and Rhythm: Normal rate and regular rhythm.  Pulmonary:     Effort: Pulmonary effort is normal.     Breath sounds: Normal breath sounds.  Abdominal:     Palpations: Abdomen is soft.     Tenderness: There is no abdominal tenderness.  Musculoskeletal:        General: Normal range of motion.     Cervical back: Normal range of motion and neck supple. No tenderness or bony tenderness.  Skin:    General: Skin is warm and dry.  Neurological:     Mental Status: He is alert and oriented to person, place, and time.     GCS: GCS eye subscore is 4. GCS verbal subscore is 5. GCS motor subscore is 6.     Cranial Nerves: No cranial nerve deficit.     Sensory: No sensory deficit.     Motor: No abnormal muscle tone.     Coordination: Coordination normal.     Gait: Gait normal.     Deep Tendon Reflexes: Reflexes are normal and symmetric.     ED Results / Procedures / Treatments   Labs (all labs ordered are listed, but only abnormal results are displayed) Labs Reviewed  RESPIRATORY PANEL BY RT PCR (FLU A&B, COVID)  GROUP A STREP  BY PCR    EKG None  Radiology No results found.  Procedures Procedures (including critical care time)  Medications Ordered in ED Medications  acetaminophen (TYLENOL) tablet 650 mg (650 mg Oral Given 11/24/19 1840)  metoCLOPramide (REGLAN) injection 10 mg (10 mg Intravenous Given 11/24/19 2004)  diphenhydrAMINE (BENADRYL) injection 25 mg (25 mg Intravenous Given 11/24/19 2004)  sodium chloride 0.9 % bolus 1,000 mL (1,000 mLs Intravenous New Bag/Given 11/24/19 2007)    ED Course  I have reviewed the  triage vital signs and the nursing notes.  Pertinent labs & imaging results that were available during my care of the patient were reviewed by me and considered in my medical decision making (see chart for details).  Patient seen and examined.  Covid negative.  Patient appears nontoxic, but does appear uncomfortable.  He is agreeable to IV for migraine cocktail, fluids, antiemetic.  Will need fluid challenge.  Will check for strep throat.  He seems reassured that his Covid is negative.  Vital signs reviewed and are as follows: BP (!) 155/105   Pulse 79   Temp 98.8 F (37.1 C) (Oral)   Resp 18   Ht 6\' 4"  (1.93 m)   Wt (!) 183.7 kg   SpO2 97%   BMI 49.30 kg/m   9:01 PM Pt rechecked.  Wife at bedside.  Patient is starting to feel better.  Reports improvement in headache.  He has tolerated crackers and fluid at bedside.  Awaiting completion of fluid bolus.  If continuing to do well, anticipate discharge to home.   9:24 PM patient up to restroom, fluids complete, requesting discharge home.  We will give prescription for Phenergan.  Encouraged PCP follow-up not improving, return to the emergency department with worsening including persistent vomiting, severe headache, confusion, high persistent fever.   MDM Rules/Calculators/A&P                          Patient presents with generalized aches and symptoms suggestive of a infection, generalized viral syndrome.  Covid, strep  negative.  He does have appears to be pharyngitis on exam.  No indications for antibiotics.  He also has a migraine headache without signs of meningismus which responded well to migraine cocktail.  Tolerating p.o.'s.  He looks well, ambulatory without difficulty.  Normal gross neurologic exam.  No indication for advanced imaging, LP at this point.   Final Clinical Impression(s) / ED Diagnoses Final diagnoses:  Viral syndrome  Acute nonintractable headache, unspecified headache type    Rx / DC Orders ED Discharge Orders         Ordered    promethazine (PHENERGAN) 25 MG tablet  Every 6 hours PRN        11/24/19 2122           2123, PA-C 11/24/19 2125    2126, DO 11/24/19 2156

## 2019-11-24 NOTE — Discharge Instructions (Signed)
Please read and follow all provided instructions.  Your diagnoses today include:  1. Viral syndrome   2. Acute nonintractable headache, unspecified headache type     Tests performed today include:  COVID, flu, strep testing - negative  Vital signs. See below for your results today.   Medications:  In the Emergency Department you received:  Reglan - antinausea/headache medication  Benadryl - antihistamine to counteract potential side effects of reglan  Take any prescribed medications only as directed.  Additional information:  Follow any educational materials contained in this packet.  You are having a headache. No specific cause was found today for your headache. It may have been a migraine or other cause of headache. Stress, anxiety, fatigue, and depression are common triggers for headaches.   Your headache today does not appear to be life-threatening or require hospitalization, but often the exact cause of headaches is not determined in the emergency department. Therefore, follow-up with your doctor is very important to find out what may have caused your headache and whether or not you need any further diagnostic testing or treatment.   Sometimes headaches can appear benign (not harmful), but then more serious symptoms can develop which should prompt an immediate re-evaluation by your doctor or the emergency department.  BE VERY CAREFUL not to take multiple medicines containing Tylenol (also called acetaminophen). Doing so can lead to an overdose which can damage your liver and cause liver failure and possibly death.   Follow-up instructions: Please follow-up with your primary care provider in the next 3 days for further evaluation of your symptoms.   Return instructions:   Please return to the Emergency Department if you experience worsening symptoms.  Return if the medications do not resolve your headache, if it recurs, or if you have multiple episodes of vomiting or  cannot keep down fluids.  Return if you have a change from the usual headache.  RETURN IMMEDIATELY IF you:  Develop a sudden, severe headache  Develop confusion or become poorly responsive or faint  Develop a fever above 100.35F or problem breathing  Have a change in speech, vision, swallowing, or understanding  Develop new weakness, numbness, tingling, incoordination in your arms or legs  Have a seizure  Please return if you have any other emergent concerns.  Additional Information:  Your vital signs today were: BP (!) 168/122   Pulse 74   Temp 98.8 F (37.1 C) (Oral)   Resp (!) 25   Ht 6\' 4"  (1.93 m)   Wt (!) 183.7 kg   SpO2 98%   BMI 49.30 kg/m  If your blood pressure (BP) was elevated above 135/85 this visit, please have this repeated by your doctor within one month. --------------

## 2019-11-24 NOTE — ED Triage Notes (Signed)
Sore throat x 1 days , took 800 mg ibuprofen at 03 pm today . Headache , chest pain . Shortness of breath

## 2019-11-24 NOTE — ED Notes (Signed)
Pt on monitor 

## 2020-01-24 ENCOUNTER — Encounter (HOSPITAL_COMMUNITY): Payer: Self-pay | Admitting: Emergency Medicine

## 2020-01-24 ENCOUNTER — Other Ambulatory Visit: Payer: Self-pay

## 2020-01-24 ENCOUNTER — Ambulatory Visit (HOSPITAL_COMMUNITY)
Admission: EM | Admit: 2020-01-24 | Discharge: 2020-01-24 | Disposition: A | Discharge status: Home / Self Care | Payer: Self-pay | Attending: Physician Assistant | Admitting: Physician Assistant

## 2020-01-24 DIAGNOSIS — Z760 Encounter for issue of repeat prescription: Secondary | ICD-10-CM

## 2020-01-24 DIAGNOSIS — I10 Essential (primary) hypertension: Secondary | ICD-10-CM

## 2020-01-24 DIAGNOSIS — M546 Pain in thoracic spine: Secondary | ICD-10-CM

## 2020-01-24 MED ORDER — LISINOPRIL-HYDROCHLOROTHIAZIDE 20-25 MG PO TABS: 1.0000 | ORAL_TABLET | Freq: Every day | ORAL | 0 refills | Status: DC

## 2020-01-24 MED ORDER — TIZANIDINE HCL 2 MG PO TABS: 2.0000 mg | ORAL_TABLET | Freq: Three times a day (TID) | ORAL | 0 refills | Status: DC | PRN

## 2020-01-24 MED ORDER — MELOXICAM 7.5 MG PO TABS: 7.5000 mg | ORAL_TABLET | Freq: Every day | ORAL | 0 refills | Status: DC

## 2020-01-24 NOTE — ED Triage Notes (Signed)
Pt states that he is having back that started two days ago. Pt states that he hasn't done anything abnormal. Pt states that the pain is right in the middle of his back.

## 2020-01-24 NOTE — ED Provider Notes (Addendum)
[facility]    CSN: [id number] Arrival date & time: 01/24/20  1518      History   Chief Complaint Chief Complaint  Patient presents with  . Back Pain    HPI [name] [name] is a 39 y.o. male.   39 year old male comes in for 2 day history of back pain. States coughed and felt pain to midline thoracic pain. Pain is improved in certain positions, worse with moving, deep breathing. Denies radiation of pain. Denies shortness of breath. Voltaren gel and heating pad without relief.      Past Medical History:  Diagnosis Date  . Hypercholesteremia   . Hypertension   . Stroke (HCC)   . Stroke [facility]) 2012    Patient Active Problem List   Diagnosis Date Noted  . Tobacco use 04/16/2018  . Morbid obesity (HCC) 04/16/2018  . Hypertension 06/02/2015    Past Surgical History:  Procedure Laterality Date  . FOOT SURGERY    . KNEE SURGERY    . ORIF RADIUS & ULNA FRACTURES    . ORIF TIBIA & FIBULA FRACTURES    . ORTHOPEDIC SURGERY    . SHOULDER SURGERY         Home Medications    Prior to Admission medications   Medication Sig Start Date End Date Taking? Authorizing Provider  diclofenac sodium (VOLTAREN) 1 % GEL Apply 2 g topically 4 (four) times daily. 07/19/18  Yes Marcine Matar, MD  lisinopril-hydrochlorothiazide (ZESTORETIC) 20-25 MG tablet Take 1 tablet by mouth daily. 10/10/19  Yes Sabas Sous, MD  potassium chloride (KLOR-CON) 10 MEQ tablet Take 1 tablet (10 mEq total) by mouth daily. 11/24/19  Yes Renne Crigler, PA-C  Ascorbic Acid (VITAMIN C PO) Take by mouth.    [provider]  Blood Pressure Monitor DEVI Use as directed to check home blood pressure 2-3 times a week 07/19/18   Marcine Matar, MD  calcium carbonate (TUMS - DOSED IN MG ELEMENTAL CALCIUM) 500 MG chewable tablet Chew 1 tablet by mouth daily.    [provider]  meloxicam (MOBIC) 7.5 MG tablet Take 1 tablet (7.5 mg total) by mouth daily. 01/24/20   Cathie Hoops, Mekala Winger V,  PA-C  pantoprazole (PROTONIX) 20 MG tablet Take 1 tablet (20 mg total) by mouth daily. 11/24/19   Renne Crigler, PA-C  promethazine (PHENERGAN) 25 MG tablet Take 1 tablet (25 mg total) by mouth every 6 (six) hours as needed for nausea or vomiting. 11/24/19   Renne Crigler, PA-C  tiZANidine (ZANAFLEX) 2 MG tablet Take 1 tablet (2 mg total) by mouth every 8 (eight) hours as needed for muscle spasms. 01/24/20   Belinda Fisher, PA-C    Family History Family History  Problem Relation Age of Onset  . Diabetes Mother   . Healthy Father     Social History Social History   Tobacco Use  . Smoking status: Light Tobacco Smoker    Types: Cigars  . Smokeless tobacco: Never Used  Vaping Use  . Vaping Use: Never used  Substance Use Topics  . Alcohol use: Yes    Comment: occasionally  . Drug use: No     Allergies   Tea   Review of Systems Review of Systems  Reason unable to perform ROS: See HPI as above.     Physical Exam Triage Vital Signs ED Triage Vitals  Enc Vitals Group     BP 01/24/20 1704 (!) 178/112     Pulse Rate  01/24/20 1704 82     Resp 01/24/20 1704 20     Temp 01/24/20 1704 98.3 F (36.8 C)     Temp Source 01/24/20 1704 Oral     SpO2 01/24/20 1704 96 %     Weight --      Height --      Head Circumference --      Peak Flow --      Pain Score 01/24/20 1658 8     Pain Loc --      Pain Edu? --      Excl. in GC? --    No data found.  Updated Vital Signs BP (!) 178/112 (BP Location: Right Arm)   Pulse 82   Temp 98.3 F (36.8 C) (Oral)   Resp 20   SpO2 96%   Physical Exam Constitutional:      General: He is not in acute distress.    Appearance: Normal appearance. He is well-developed. He is not toxic-appearing or diaphoretic.  HENT:     Head: Normocephalic and atraumatic.  Eyes:     Conjunctiva/sclera: Conjunctivae normal.     Pupils: Pupils are equal, round, and reactive to light.  Cardiovascular:     Rate and Rhythm: Normal rate and regular rhythm.   Pulmonary:     Effort: Pulmonary effort is normal. No respiratory distress.     Comments: LCTAB Musculoskeletal:     Cervical back: Normal range of motion and neck supple.     Comments: No tenderness to palpation of spinous processes. Tenderness to palpation of mid thoracic paraspinal muscle. Full ROM of BUE. Strength 5/5. Sensation intact.  Skin:    General: Skin is warm and dry.  Neurological:     Mental Status: He is alert and oriented to person, place, and time.      UC Treatments / Results  Labs (all labs ordered are listed, but only abnormal results are displayed) Labs Reviewed - No data to display  EKG   Radiology No results found.  Procedures Procedures (including critical care time)  Medications Ordered in UC Medications - No data to display  Initial Impression / Assessment and Plan / UC Course  I have reviewed the triage vital signs and the nursing notes.  Pertinent labs & imaging results that were available during my care of the patient were reviewed by me and considered in my medical decision making (see chart for details).    NSAID as directed. Muscle relaxant as needed. Ice/heat compresses. Expected course of healing discussed. Return precautions given.   Patient states would like is BP med to be refilled. Has PCP appointment next month, but ran out of medicine. Lisinopril-HCTZ refilled for 30 days. To follow up with PCP for further refills.   Final Clinical Impressions(s) / UC Diagnoses   Final diagnoses:  Acute bilateral thoracic back pain    ED Prescriptions    Medication Sig Dispense Auth. Provider   meloxicam (MOBIC) 7.5 MG tablet Take 1 tablet (7.5 mg total) by mouth daily. 10 tablet Richell Corker V, PA-C   tiZANidine (ZANAFLEX) 2 MG tablet Take 1 tablet (2 mg total) by mouth every 8 (eight) hours as needed for muscle spasms. 15 tablet Belinda Fisher, PA-C     PDMP not reviewed this encounter.   Belinda Fisher, PA-C 01/24/20 1757    Belinda Fisher,  PA-C 01/24/20 [name]

## 2020-01-24 NOTE — Discharge Instructions (Signed)
Start Mobic. Do not take ibuprofen (motrin/advil)/ naproxen (aleve) while on mobic. Tizanidine as needed, this can make you drowsy, so do not take if you are going to drive, operate heavy machinery, or make important decisions. Ice/heat compresses as needed. This can take up to 3-4 weeks to completely resolve, but you should be feeling better each week. Follow up with PCP/orthopedics if symptoms worsen, changes for reevaluation. If sudden shortness of breath, chest pain, go to the emergency department for further evaluation.

## 2020-08-26 ENCOUNTER — Other Ambulatory Visit: Payer: Self-pay | Admitting: Internal Medicine

## 2020-08-26 NOTE — Telephone Encounter (Signed)
Expired script   Review for continued use and refill    Requested Prescriptions  Pending Prescriptions Disp Refills   potassium chloride (KLOR-CON) 8 MEQ tablet [Pharmacy Med Name: POTASSIUM CHLORIDE ER TABLETS] 60 tablet 0    Sig: TAKE 2 TABLETS(16 MEQ) BY MOUTH DAILY      Off-Protocol Failed - 08/26/2020  2:27 PM      Failed - Medication not assigned to a protocol, review manually.      Failed - Valid encounter within last 12 months    Recent Outpatient Visits           2 years ago Essential hypertension   [facility] Marcine Matar, MD   2 years ago Essential hypertension   [facility] Marcine Matar, MD   5 years ago Essential hypertension   [facility] Swords, Valetta Mole, MD               Endocrinology:  Minerals - Potassium Supplementation Failed - 08/26/2020  2:27 PM      Failed - Valid encounter within last 12 months    Recent Outpatient Visits           2 years ago Essential hypertension   [facility] Marcine Matar, MD   2 years ago Essential hypertension   [facility] Marcine Matar, MD   5 years ago Essential hypertension   [facility] Swords, Valetta Mole, MD                Passed - K in normal range and within 360 days    Potassium  Date Value Ref Range Status  10/10/2019 3.6 3.5 - 5.1 mmol/L Final          Passed - Cr in normal range and within 360 days    Creatinine, Ser  Date Value Ref Range Status  10/10/2019 0.97 0.61 - 1.24 mg/dL Final

## 2020-08-30 ENCOUNTER — Other Ambulatory Visit: Payer: Self-pay

## 2020-08-30 ENCOUNTER — Encounter (HOSPITAL_COMMUNITY): Payer: Self-pay

## 2020-08-30 ENCOUNTER — Ambulatory Visit (HOSPITAL_COMMUNITY)
Admission: EM | Admit: 2020-08-30 | Discharge: 2020-08-30 | Disposition: A | Discharge status: Home / Self Care | Payer: Self-pay | Attending: Physician Assistant | Admitting: Physician Assistant

## 2020-08-30 DIAGNOSIS — M62830 Muscle spasm of back: Secondary | ICD-10-CM

## 2020-08-30 DIAGNOSIS — I10 Essential (primary) hypertension: Secondary | ICD-10-CM

## 2020-08-30 MED ORDER — KETOROLAC TROMETHAMINE 30 MG/ML IJ SOLN
INTRAMUSCULAR | Status: AC
  Filled 2020-08-30: qty 1

## 2020-08-30 MED ORDER — LISINOPRIL-HYDROCHLOROTHIAZIDE 20-25 MG PO TABS: 1.0000 | ORAL_TABLET | Freq: Every day | ORAL | 2 refills | Status: DC

## 2020-08-30 MED ORDER — CYCLOBENZAPRINE HCL 5 MG PO TABS: 5.0000 mg | ORAL_TABLET | Freq: Three times a day (TID) | ORAL | 0 refills | Status: DC | PRN

## 2020-08-30 MED ORDER — KETOROLAC TROMETHAMINE 30 MG/ML IJ SOLN
30.0000 mg | Freq: Once | INTRAMUSCULAR | Status: AC
  Administered 2020-08-30: 30 mg via INTRAMUSCULAR

## 2020-08-30 NOTE — ED Provider Notes (Signed)
[facility]    CSN: [id number] Arrival date & time: 08/30/20  1431      History   Chief Complaint Chief Complaint  Patient presents with   Back Pain    HPI [name] [name] is a 40 y.o. male.   Pt complains of recurrent lower back pain that became worse yesterday.  Denies recent injury or trauma.  Complains of left foot numbness, sometimes left buttocks numbness and leg numbness.  Denies saddle anesthesia or loss of bowel or bladder control.  Nothing seems to make it better or worse.  He reports with his last exacerbation of sx muscle relaxer helped. He has taken nothing for the sx.  He does not currently have an orthopedic doctor. History of left leg ortho surgery which could contribute to left leg numbness.   Pt reports he is out of BP medication, did not have his dose today.  He is requesting refill today.  He denies sx.  He reports he is moving to [place] and will establish care there.    Past Medical History:  Diagnosis Date   Hypercholesteremia    Hypertension    Stroke [facility])    Stroke [facility]) 2012    Patient Active Problem List   Diagnosis Date Noted   Tobacco use 04/16/2018   Morbid obesity (HCC) 04/16/2018   Hypertension 06/02/2015    Past Surgical History:  Procedure Laterality Date   FOOT SURGERY     KNEE SURGERY     ORIF RADIUS & ULNA FRACTURES     ORIF TIBIA & FIBULA FRACTURES     ORTHOPEDIC SURGERY     SHOULDER SURGERY         Home Medications    Prior to Admission medications   Medication Sig Start Date End Date Taking? Authorizing Provider  Ascorbic Acid (VITAMIN C PO) Take by mouth.    [provider]  Blood Pressure Monitor DEVI Use as directed to check home blood pressure 2-3 times a week 07/19/18   Marcine Matar, MD  calcium carbonate (TUMS - DOSED IN MG ELEMENTAL CALCIUM) 500 MG chewable tablet Chew 1 tablet by mouth daily.    [provider]  diclofenac sodium (VOLTAREN) 1 % GEL Apply 2 g topically 4  (four) times daily. 07/19/18   Marcine Matar, MD  lisinopril-hydrochlorothiazide (ZESTORETIC) 20-25 MG tablet Take 1 tablet by mouth daily. 01/24/20   Cathie Hoops, Amy V, PA-C  meloxicam (MOBIC) 7.5 MG tablet Take 1 tablet (7.5 mg total) by mouth daily. 01/24/20   Cathie Hoops, Amy V, PA-C  pantoprazole (PROTONIX) 20 MG tablet Take 1 tablet (20 mg total) by mouth daily. 11/24/19   Renne Crigler, PA-C  potassium chloride (KLOR-CON) 10 MEQ tablet Take 1 tablet (10 mEq total) by mouth daily. 11/24/19   Renne Crigler, PA-C  promethazine (PHENERGAN) 25 MG tablet Take 1 tablet (25 mg total) by mouth every 6 (six) hours as needed for nausea or vomiting. 11/24/19   Renne Crigler, PA-C  tiZANidine (ZANAFLEX) 2 MG tablet Take 1 tablet (2 mg total) by mouth every 8 (eight) hours as needed for muscle spasms. 01/24/20   Belinda Fisher, PA-C    Family History Family History  Problem Relation Age of Onset   Diabetes Mother    Healthy Father     Social History Social History   Tobacco Use   Smoking status: Light Smoker    Types: Cigars   Smokeless tobacco: Never  Vaping Use   Vaping Use:  Never used  Substance Use Topics   Alcohol use: Yes    Comment: occasionally   Drug use: No     Allergies   Tea   Review of Systems Review of Systems  Constitutional:  Negative for chills and fever.  HENT:  Negative for ear pain and sore throat.   Eyes:  Negative for pain and visual disturbance.  Respiratory:  Negative for cough and shortness of breath.   Cardiovascular:  Negative for chest pain and palpitations.  Gastrointestinal:  Negative for abdominal pain and vomiting.  Genitourinary:  Negative for dysuria and hematuria.  Musculoskeletal:  Positive for back pain and myalgias. Negative for arthralgias.  Skin:  Negative for color change and rash.  Neurological:  Negative for seizures and syncope.  All other systems reviewed and are negative.   Physical Exam Triage Vital Signs ED Triage Vitals  Enc Vitals Group      BP 08/30/20 1652 (!) 158/109     Pulse Rate 08/30/20 1652 71     Resp 08/30/20 1652 17     Temp 08/30/20 1652 98.3 F (36.8 C)     Temp Source 08/30/20 1652 Oral     SpO2 08/30/20 1652 95 %     Weight --      Height --      Head Circumference --      Peak Flow --      Pain Score 08/30/20 1701 8     Pain Loc --      Pain Edu? --      Excl. in GC? --    No data found.  Updated Vital Signs BP (!) 158/109 (BP Location: Left Arm) Comment: Out of BP medication  Pulse 71   Temp 98.3 F (36.8 C) (Oral)   Resp 17   SpO2 95%   Visual Acuity Right Eye Distance:   Left Eye Distance:   Bilateral Distance:    Right Eye Near:   Left Eye Near:    Bilateral Near:     Physical Exam Vitals and nursing note reviewed.  Constitutional:      Appearance: He is well-developed.  HENT:     Head: Normocephalic and atraumatic.  Eyes:     Conjunctiva/sclera: Conjunctivae normal.  Cardiovascular:     Rate and Rhythm: Normal rate and regular rhythm.     Heart sounds: No murmur heard. Pulmonary:     Effort: Pulmonary effort is normal. No respiratory distress.     Breath sounds: Normal breath sounds.  Abdominal:     Palpations: Abdomen is soft.     Tenderness: There is no abdominal tenderness.  Musculoskeletal:     Cervical back: Neck supple.     Lumbar back: Spasms and tenderness present. No bony tenderness. Negative right straight leg raise test and negative left straight leg raise test.  Skin:    General: Skin is warm and dry.  Neurological:     Mental Status: He is alert.     UC Treatments / Results  Labs (all labs ordered are listed, but only abnormal results are displayed) Labs Reviewed - No data to display  EKG   Radiology No results found.  Procedures Procedures (including critical care time)  Medications Ordered in UC Medications - No data to display  Initial Impression / Assessment and Plan / UC Course  I have reviewed the triage vital signs and the  nursing notes.  Pertinent labs & imaging results that were available during my care of the  patient were reviewed by me and considered in my medical decision making (see chart for details).     No bony tenderness, tttp with spasm noted to bilateral lumbar paraspinal musculature.  Negative SLR. Toradol given today.  Muscle relaxer prescribed.  Advised ice to affected area, stretching, light walking.   Elevated BP here in clinic today.  He denies headache, visual changes, chest pain, shortness of breath. Pt reports he is out of BP medication and requesting refill today.  He reports he does not currently have a PCP and will soon be moving to [place] and will establish care there. Strict return precautions discussed.  Final Clinical Impressions(s) / UC Diagnoses   Final diagnoses:  None   Discharge Instructions   None    ED Prescriptions   None    PDMP not reviewed this encounter.   Jodell Cipro, PA-C 08/30/20 1719

## 2020-08-30 NOTE — ED Triage Notes (Signed)
Pt is also requesting refill on blood pressure medication.

## 2020-08-30 NOTE — ED Triage Notes (Signed)
Pt presents with chronic recurring lower back pain.

## 2020-08-30 NOTE — Discharge Instructions (Addendum)
Take flexeril as needed for muscle spasm, can continue tylenol.  Apply ice to affected areas.  Recommend light stretching and walking.   Monitor blood pressure and resume taking BP medications.  It is important to establish care with primary care physician.  If you develop chest pain, headache, shortness of breath, lower extremity swelling go to the ED for evaluation.

## 2020-12-02 ENCOUNTER — Other Ambulatory Visit: Payer: Self-pay

## 2020-12-02 ENCOUNTER — Encounter (HOSPITAL_COMMUNITY): Payer: Self-pay | Admitting: Emergency Medicine

## 2020-12-02 ENCOUNTER — Ambulatory Visit (HOSPITAL_COMMUNITY)
Admission: EM | Admit: 2020-12-02 | Discharge: 2020-12-02 | Disposition: A | Discharge status: Home / Self Care | Payer: Self-pay | Attending: Emergency Medicine | Admitting: Emergency Medicine

## 2020-12-02 DIAGNOSIS — M545 Low back pain, unspecified: Secondary | ICD-10-CM

## 2020-12-02 DIAGNOSIS — I10 Essential (primary) hypertension: Secondary | ICD-10-CM

## 2020-12-02 DIAGNOSIS — G8929 Other chronic pain: Secondary | ICD-10-CM

## 2020-12-02 MED ORDER — METHYLPREDNISOLONE SODIUM SUCC 125 MG IJ SOLR
INTRAMUSCULAR | Status: AC
  Filled 2020-12-02: qty 2

## 2020-12-02 MED ORDER — METHYLPREDNISOLONE SODIUM SUCC 125 MG IJ SOLR
125.0000 mg | Freq: Once | INTRAMUSCULAR | Status: AC
  Administered 2020-12-02: 125 mg via INTRAMUSCULAR

## 2020-12-02 MED ORDER — LISINOPRIL-HYDROCHLOROTHIAZIDE 20-25 MG PO TABS: 1.0000 | ORAL_TABLET | Freq: Every day | ORAL | 0 refills | Status: DC

## 2020-12-02 NOTE — Discharge Instructions (Addendum)
Take motrin or tylenol for pain  Can use heat as needed  Will need to see ortho for back pain  Need to follow up with a new pcp for bp control

## 2020-12-02 NOTE — ED Provider Notes (Signed)
[facility]    CSN: [id number] Arrival date & time: 12/02/20  1327      History   Chief Complaint Chief Complaint  Patient presents with   Back Pain    HPI [name] [name] is a 40 y.o. male.   Pt is here for blood pressure refill and lower back pain. Pt states he has chronic back pain was putting on shoes today and felt a pull sensation to lower back. Last visit he received a shot and it was better. Not able to see a pcp or ortho due to insurance reasons. Took last bp pill before he came denies any headache no blurred vision.    Past Medical History:  Diagnosis Date   Hypercholesteremia    Hypertension    Stroke [facility])    Stroke [facility]) 2012    Patient Active Problem List   Diagnosis Date Noted   Tobacco use 04/16/2018   Morbid obesity (HCC) 04/16/2018   Hypertension 06/02/2015    Past Surgical History:  Procedure Laterality Date   FOOT SURGERY     KNEE SURGERY     ORIF RADIUS & ULNA FRACTURES     ORIF TIBIA & FIBULA FRACTURES     ORTHOPEDIC SURGERY     SHOULDER SURGERY         Home Medications    Prior to Admission medications   Medication Sig Start Date End Date Taking? Authorizing Provider  Ascorbic Acid (VITAMIN C PO) Take by mouth.    [provider]  Blood Pressure Monitor DEVI Use as directed to check home blood pressure 2-3 times a week 07/19/18   Marcine Matar, MD  calcium carbonate (TUMS - DOSED IN MG ELEMENTAL CALCIUM) 500 MG chewable tablet Chew 1 tablet by mouth daily.    [provider]  cyclobenzaprine (FLEXERIL) 5 MG tablet Take 1 tablet (5 mg total) by mouth 3 (three) times daily as needed for muscle spasms. Patient not taking: Reported on 12/02/2020 08/30/20   Ward, Tylene Fantasia, PA-C  diclofenac sodium (VOLTAREN) 1 % GEL Apply 2 g topically 4 (four) times daily. Patient not taking: Reported on 12/02/2020 07/19/18   Marcine Matar, MD  lisinopril-hydrochlorothiazide (ZESTORETIC) 20-25 MG tablet Take 1 tablet  by mouth daily. 12/02/20   Coralyn Mark, NP  meloxicam (MOBIC) 7.5 MG tablet Take 1 tablet (7.5 mg total) by mouth daily. Patient not taking: Reported on 12/02/2020 01/24/20   Belinda Fisher, PA-C  pantoprazole (PROTONIX) 20 MG tablet Take 1 tablet (20 mg total) by mouth daily. Patient not taking: Reported on 12/02/2020 11/24/19   Renne Crigler, PA-C  potassium chloride (KLOR-CON) 10 MEQ tablet Take 1 tablet (10 mEq total) by mouth daily. Patient not taking: Reported on 12/02/2020 11/24/19   Renne Crigler, PA-C  promethazine (PHENERGAN) 25 MG tablet Take 1 tablet (25 mg total) by mouth every 6 (six) hours as needed for nausea or vomiting. Patient not taking: Reported on 12/02/2020 11/24/19   Renne Crigler, PA-C    Family History Family History  Problem Relation Age of Onset   Diabetes Mother    Healthy Father     Social History Social History   Tobacco Use   Smoking status: Light Smoker    Types: Cigars   Smokeless tobacco: Never  Vaping Use   Vaping Use: Never used  Substance Use Topics   Alcohol use: Yes    Comment: occasionally   Drug use: No     Allergies  Tea   Review of Systems Review of Systems  Constitutional: Negative.  Negative for fever.  Eyes: Negative.   Respiratory: Negative.    Cardiovascular: Negative.   Gastrointestinal: Negative.   Genitourinary: Negative.   Musculoskeletal:  Positive for back pain.       Lower back pain with movement and reaching , no recent injury   Skin: Negative.   Neurological: Negative.  Negative for dizziness, syncope, facial asymmetry, weakness, light-headedness, numbness and headaches.    Physical Exam Triage Vital Signs ED Triage Vitals  Enc Vitals Group     BP 12/02/20 1416 (!) 194/128     Pulse Rate 12/02/20 1416 75     Resp 12/02/20 1416 (!) 22     Temp 12/02/20 1422 98.1 F (36.7 C)     Temp Source 12/02/20 1422 Oral     SpO2 12/02/20 1416 97 %     Weight --      Height --      Head Circumference  --      Peak Flow --      Pain Score 12/02/20 1413 8     Pain Loc --      Pain Edu? --      Excl. in GC? --    No data found.  Updated Vital Signs BP (!) 201/127 (BP Location: Right Arm) Comment (BP Location): RIGHT FOREARM, LARGE CUFF  Pulse 75   Temp 98.1 F (36.7 C) (Oral)   Resp (!) 22   SpO2 97%   Visual Acuity Right Eye Distance:   Left Eye Distance:   Bilateral Distance:    Right Eye Near:   Left Eye Near:    Bilateral Near:     Physical Exam Constitutional:      Appearance: Normal appearance. He is obese.  Eyes:     Pupils: Pupils are equal, round, and reactive to light.  Cardiovascular:     Rate and Rhythm: Normal rate.  Pulmonary:     Effort: Pulmonary effort is normal.  Abdominal:     General: Abdomen is flat.  Musculoskeletal:        General: No signs of injury.     Comments: Lower lumbar region pain with flexion. Pt able to walk. Chronic pain.   Skin:    General: Skin is warm.  Neurological:     General: No focal deficit present.     Mental Status: He is alert.     UC Treatments / Results  Labs (all labs ordered are listed, but only abnormal results are displayed) Labs Reviewed - No data to display  EKG   Radiology No results found.  Procedures Procedures (including critical care time)  Medications Ordered in UC Medications  methylPREDNISolone sodium succinate (SOLU-MEDROL) 125 mg/2 mL injection 125 mg (has no administration in time range)    Initial Impression / Assessment and Plan / UC Course  I have reviewed the triage vital signs and the nursing notes.  Pertinent labs & imaging results that were available during my care of the patient were reviewed by me and considered in my medical decision making (see chart for details).     Discussed with pt that his bp is high and went over risk of stroke again . Pt states that he is fine that he doesn't have any of those symptoms.  Also educated the need for a pcp with medical hx and  that some one needs to monitor his bp.  Will need to see ortho for  possible arthritis and pain  Will need to go to er if symptoms are not better  Final Clinical Impressions(s) / UC Diagnoses   Final diagnoses:  Chronic midline low back pain without sciatica  Essential hypertension     Discharge Instructions      Take motrin or tylenol for pain  Can use heat as needed  Will need to see ortho for back pain  Need to follow up with a new pcp for bp control       ED Prescriptions     Medication Sig Dispense Auth. Provider   lisinopril-hydrochlorothiazide (ZESTORETIC) 20-25 MG tablet Take 1 tablet by mouth daily. 30 tablet Coralyn Mark, NP      PDMP not reviewed this encounter.   Coralyn Mark, NP 12/02/20 [phone]

## 2020-12-02 NOTE — ED Triage Notes (Signed)
Back pain noticed after bending forward to put on slippers.  Pain in lower back  Answered phone during in take

## 2020-12-16 ENCOUNTER — Encounter: Payer: Self-pay | Admitting: *Deleted

## 2021-01-04 ENCOUNTER — Other Ambulatory Visit: Payer: Self-pay

## 2021-01-04 ENCOUNTER — Encounter (HOSPITAL_COMMUNITY): Payer: Self-pay | Admitting: Emergency Medicine

## 2021-01-04 ENCOUNTER — Ambulatory Visit (HOSPITAL_COMMUNITY)
Admission: EM | Admit: 2021-01-04 | Discharge: 2021-01-04 | Disposition: A | Discharge status: Home / Self Care | Payer: Self-pay | Attending: Urgent Care | Admitting: Urgent Care

## 2021-01-04 DIAGNOSIS — J069 Acute upper respiratory infection, unspecified: Secondary | ICD-10-CM

## 2021-01-04 DIAGNOSIS — I10 Essential (primary) hypertension: Secondary | ICD-10-CM

## 2021-01-04 DIAGNOSIS — R030 Elevated blood-pressure reading, without diagnosis of hypertension: Secondary | ICD-10-CM

## 2021-01-04 MED ORDER — LISINOPRIL-HYDROCHLOROTHIAZIDE 20-25 MG PO TABS: 1.0000 | ORAL_TABLET | Freq: Every day | ORAL | 0 refills | Status: DC

## 2021-01-04 MED ORDER — PROMETHAZINE-DM 6.25-15 MG/5ML PO SYRP: 5.0000 mL | ORAL_SOLUTION | Freq: Every evening | ORAL | 0 refills | Status: DC | PRN

## 2021-01-04 MED ORDER — CETIRIZINE HCL 10 MG PO TABS: 10.0000 mg | ORAL_TABLET | Freq: Every day | ORAL | 0 refills | Status: DC

## 2021-01-04 MED ORDER — IPRATROPIUM BROMIDE 0.03 % NA SOLN: 2.0000 | Freq: Two times a day (BID) | NASAL | 0 refills | Status: DC

## 2021-01-04 MED ORDER — BENZONATATE 100 MG PO CAPS: 100.0000 mg | ORAL_CAPSULE | Freq: Three times a day (TID) | ORAL | 0 refills | Status: DC | PRN

## 2021-01-04 NOTE — ED Provider Notes (Signed)
[facility]   MRN: [id number] DOB: 18-Jul-1980  Subjective:   [name] [name] is a 40 y.o. male presenting for 2-day history of acute onset left ear pain, coughing, throat pain.  States that he does not feel that sick.  Does not want to be tested for influenza.  Regarding his blood pressure, ran out today.  States that he needs a refill.  Has not been back to his PCP.  Has a history of stroke.  Denies headache, confusion, weakness, numbness or tingling, chest pain, shortness of breath, wheezing, nausea, vomiting, abdominal pain, hematuria.  No current facility-administered medications for this encounter.  Current Outpatient Medications:    Ascorbic Acid (VITAMIN C PO), Take by mouth., Disp: , Rfl:    Blood Pressure Monitor DEVI, Use as directed to check home blood pressure 2-3 times a week, Disp: 1 Device, Rfl: 0   calcium carbonate (TUMS - DOSED IN MG ELEMENTAL CALCIUM) 500 MG chewable tablet, Chew 1 tablet by mouth daily., Disp: , Rfl:    cyclobenzaprine (FLEXERIL) 5 MG tablet, Take 1 tablet (5 mg total) by mouth 3 (three) times daily as needed for muscle spasms. (Patient not taking: Reported on 12/02/2020), Disp: 30 tablet, Rfl: 0   diclofenac sodium (VOLTAREN) 1 % GEL, Apply 2 g topically 4 (four) times daily. (Patient not taking: Reported on 12/02/2020), Disp: 2 g, Rfl: 2   lisinopril-hydrochlorothiazide (ZESTORETIC) 20-25 MG tablet, Take 1 tablet by mouth daily., Disp: 30 tablet, Rfl: 0   meloxicam (MOBIC) 7.5 MG tablet, Take 1 tablet (7.5 mg total) by mouth daily. (Patient not taking: Reported on 12/02/2020), Disp: 10 tablet, Rfl: 0   pantoprazole (PROTONIX) 20 MG tablet, Take 1 tablet (20 mg total) by mouth daily. (Patient not taking: Reported on 12/02/2020), Disp: 30 tablet, Rfl: 0   potassium chloride (KLOR-CON) 10 MEQ tablet, Take 1 tablet (10 mEq total) by mouth daily. (Patient not taking: Reported on 12/02/2020), Disp: 30 tablet, Rfl: 0   promethazine  (PHENERGAN) 25 MG tablet, Take 1 tablet (25 mg total) by mouth every 6 (six) hours as needed for nausea or vomiting. (Patient not taking: Reported on 12/02/2020), Disp: 10 tablet, Rfl: 0    Allergies  Allergen Reactions   Tea Anaphylaxis    Past Medical History:  Diagnosis Date   Hypercholesteremia    Hypertension    Stroke ([place])    Stroke ([place]) 2012     Past Surgical History:  Procedure Laterality Date   FOOT SURGERY     KNEE SURGERY     ORIF RADIUS & ULNA FRACTURES     ORIF TIBIA & FIBULA FRACTURES     ORTHOPEDIC SURGERY     SHOULDER SURGERY      Family History  Problem Relation Age of Onset   Diabetes Mother    Healthy Father     Social History   Tobacco Use   Smoking status: Light Smoker    Types: Cigars   Smokeless tobacco: Never  Vaping Use   Vaping Use: Never used  Substance Use Topics   Alcohol use: Yes    Comment: occasionally   Drug use: No    ROS   Objective:   Vitals: BP (!) 180/111 (BP Location: Left Arm)   Pulse 84   Temp 98.4 F (36.9 C) (Oral)   Resp 18   SpO2 98%   Physical Exam Constitutional:      General: He is not in acute distress.    Appearance:  Normal appearance. He is well-developed. He is obese. He is not ill-appearing, toxic-appearing or diaphoretic.  HENT:     Head: Normocephalic and atraumatic.     Right Ear: Tympanic membrane, ear canal and external ear normal. There is no impacted cerumen.     Left Ear: Tympanic membrane, ear canal and external ear normal. There is no impacted cerumen.     Nose: Congestion present. No rhinorrhea.     Mouth/Throat:     Mouth: Mucous membranes are moist.     Pharynx: No oropharyngeal exudate or posterior oropharyngeal erythema.  Eyes:     General: No scleral icterus.       Right eye: No discharge.        Left eye: No discharge.     Extraocular Movements: Extraocular movements intact.     Conjunctiva/sclera: Conjunctivae normal.     Pupils: Pupils are equal, round, and reactive  to light.  Cardiovascular:     Rate and Rhythm: Normal rate and regular rhythm.     Heart sounds: Normal heart sounds. No murmur heard.   No friction rub. No gallop.  Pulmonary:     Effort: Pulmonary effort is normal. No respiratory distress.     Breath sounds: Normal breath sounds. No stridor. No wheezing, rhonchi or rales.  Musculoskeletal:     Cervical back: Normal range of motion and neck supple. No rigidity. No muscular tenderness.  Skin:    General: Skin is warm and dry.  Neurological:     General: No focal deficit present.     Mental Status: He is alert and oriented to person, place, and time.     Cranial Nerves: No cranial nerve deficit.     Motor: No weakness.     Coordination: Coordination normal.     Gait: Gait normal.     Comments: Negative Romberg and pronator drift.  No facial asymmetry.  Psychiatric:        Mood and Affect: Mood normal.        Behavior: Behavior normal.        Thought Content: Thought content normal.    Assessment and Plan :   PDMP not reviewed this encounter.  1. Viral URI with cough   2. Essential hypertension   3. Elevated blood pressure reading    Suspect viral URI, viral syndrome. Physical exam findings reassuring and vital signs stable for discharge. Advised supportive care, offered symptomatic relief.  No signs of an acute encephalopathy, TIA or stroke.  Emphasized need for medical compliance.  Refilled his blood pressure medications for 30 days.  The medication notes themselves state that he is due back for an office visit prior to further refills.  Discussed this with patient and let him know that he has to set up a follow-up appointment with his regular doctor so they can continue management of his chronic conditions including high blood pressure.  Counseled patient on potential for adverse effects with medications prescribed/recommended today, strict ER and return-to-clinic precautions discussed, patient verbalized understanding.      Wallis Bamberg, [place] 01/04/21 [phone]

## 2021-01-04 NOTE — ED Triage Notes (Signed)
Pt is present today with cough, sore throat, and left ear pain. Pt states sx started x2 days ago

## 2021-02-01 ENCOUNTER — Encounter (HOSPITAL_BASED_OUTPATIENT_CLINIC_OR_DEPARTMENT_OTHER): Payer: Self-pay

## 2021-02-01 ENCOUNTER — Other Ambulatory Visit: Payer: Self-pay

## 2021-02-01 DIAGNOSIS — M545 Low back pain, unspecified: Secondary | ICD-10-CM | POA: Insufficient documentation

## 2021-02-01 DIAGNOSIS — I10 Essential (primary) hypertension: Secondary | ICD-10-CM | POA: Insufficient documentation

## 2021-02-01 DIAGNOSIS — Z79899 Other long term (current) drug therapy: Secondary | ICD-10-CM | POA: Insufficient documentation

## 2021-02-01 DIAGNOSIS — F17290 Nicotine dependence, other tobacco product, uncomplicated: Secondary | ICD-10-CM | POA: Insufficient documentation

## 2021-02-01 NOTE — ED Triage Notes (Addendum)
Pt c/o lower back pain started today while bowling ~15 min PTA-slow gait/grimacing

## 2021-02-02 ENCOUNTER — Emergency Department (HOSPITAL_BASED_OUTPATIENT_CLINIC_OR_DEPARTMENT_OTHER)
Admission: EM | Admit: 2021-02-02 | Discharge: 2021-02-02 | Disposition: A | Discharge status: Home / Self Care | Payer: Self-pay | Attending: Emergency Medicine | Admitting: Emergency Medicine

## 2021-02-02 DIAGNOSIS — M5410 Radiculopathy, site unspecified: Secondary | ICD-10-CM

## 2021-02-02 DIAGNOSIS — I10 Essential (primary) hypertension: Secondary | ICD-10-CM

## 2021-02-02 MED ORDER — LISINOPRIL-HYDROCHLOROTHIAZIDE 20-25 MG PO TABS: 1.0000 | ORAL_TABLET | Freq: Every day | ORAL | 2 refills | Status: DC

## 2021-02-02 MED ORDER — HYDROCODONE-ACETAMINOPHEN 5-325 MG PO TABS: 1.0000 | ORAL_TABLET | Freq: Four times a day (QID) | ORAL | 0 refills | Status: DC | PRN

## 2021-02-02 MED ORDER — PREDNISONE 10 MG PO TABS: 20.0000 mg | ORAL_TABLET | Freq: Two times a day (BID) | ORAL | 0 refills | Status: DC

## 2021-02-02 MED ORDER — KETOROLAC TROMETHAMINE 60 MG/2ML IM SOLN
60.0000 mg | Freq: Once | INTRAMUSCULAR | Status: AC
  Administered 2021-02-02: 01:00:00 60 mg via INTRAMUSCULAR
  Filled 2021-02-02: qty 2

## 2021-02-02 MED ORDER — PREDNISONE 50 MG PO TABS
60.0000 mg | ORAL_TABLET | Freq: Once | ORAL | Status: AC
  Administered 2021-02-02: 01:00:00 60 mg via ORAL
  Filled 2021-02-02: qty 1

## 2021-02-02 MED ORDER — OXYCODONE-ACETAMINOPHEN 5-325 MG PO TABS
2.0000 | ORAL_TABLET | Freq: Once | ORAL | Status: AC
  Administered 2021-02-02: 01:00:00 2 via ORAL
  Filled 2021-02-02: qty 2

## 2021-02-02 NOTE — ED Notes (Signed)
Pt verbalizes understanding of discharge instructions. Opportunity for questioning and answers were provided. Pt discharged from ED to home.   ? ?

## 2021-02-02 NOTE — ED Provider Notes (Signed)
[facility] EMERGENCY DEPARTMENT Provider Note   CSN: [id number] Arrival date & time: 02/01/21  2302     History Chief Complaint  Patient presents with   Back Pain    [name] [name] is a 40 y.o. male.  Patient is a 40 year old male with past medical history of hypertension and hyperlipidemia.  Patient presenting today with complaints of low back pain.  He was bowling this evening.  When he tossed the ball, he felt a pull in his low back with radiation down the back of his left leg.  He describes numbness, but no weakness.  He denies any bowel or bladder complaints.  The history is provided by the patient.  Back Pain Location:  Lumbar spine Quality:  Stabbing Radiates to:  L posterior upper leg Pain severity:  Severe Onset quality:  Sudden Duration:  4 hours Timing:  Constant Progression:  Worsening Chronicity:  New Relieved by:  Nothing Worsened by:  Palpation, twisting and movement     Past Medical History:  Diagnosis Date   Hypercholesteremia    Hypertension    Stroke [facility])    Stroke (HCC) 2012    Patient Active Problem List   Diagnosis Date Noted   Tobacco use 04/16/2018   Morbid obesity (HCC) 04/16/2018   Hypertension 06/02/2015    Past Surgical History:  Procedure Laterality Date   FOOT SURGERY     KNEE SURGERY     ORIF RADIUS & ULNA FRACTURES     ORIF TIBIA & FIBULA FRACTURES     ORTHOPEDIC SURGERY     SHOULDER SURGERY         Family History  Problem Relation Age of Onset   Diabetes Mother    Healthy Father     Social History   Tobacco Use   Smoking status: Some Days    Types: Cigars   Smokeless tobacco: Never  Vaping Use   Vaping Use: Never used  Substance Use Topics   Alcohol use: Yes    Comment: occasionally   Drug use: No    Home Medications Prior to Admission medications   Medication Sig Start Date End Date Taking? Authorizing Provider  Ascorbic Acid (VITAMIN C PO) Take by mouth.    [provider]   benzonatate (TESSALON) 100 MG capsule Take 1-2 capsules (100-200 mg total) by mouth 3 (three) times daily as needed for cough. 01/04/21   Wallis Bamberg, PA-C  Blood Pressure Monitor DEVI Use as directed to check home blood pressure 2-3 times a week 07/19/18   Marcine Matar, MD  calcium carbonate (TUMS - DOSED IN MG ELEMENTAL CALCIUM) 500 MG chewable tablet Chew 1 tablet by mouth daily.    [provider]  cetirizine (ZYRTEC ALLERGY) 10 MG tablet Take 1 tablet (10 mg total) by mouth daily. 01/04/21   Wallis Bamberg, PA-C  cyclobenzaprine (FLEXERIL) 5 MG tablet Take 1 tablet (5 mg total) by mouth 3 (three) times daily as needed for muscle spasms. Patient not taking: Reported on 12/02/2020 08/30/20   Ward, Tylene Fantasia, PA-C  diclofenac sodium (VOLTAREN) 1 % GEL Apply 2 g topically 4 (four) times daily. Patient not taking: Reported on 12/02/2020 07/19/18   Marcine Matar, MD  ipratropium (ATROVENT) 0.03 % nasal spray Place 2 sprays into both nostrils 2 (two) times daily. 01/04/21   Wallis Bamberg, PA-C  lisinopril-hydrochlorothiazide (ZESTORETIC) 20-25 MG tablet Take 1 tablet by mouth daily. 01/04/21   Wallis Bamberg, PA-C  meloxicam (MOBIC) 7.5 MG tablet  Take 1 tablet (7.5 mg total) by mouth daily. Patient not taking: Reported on 12/02/2020 01/24/20   Belinda Fisher, PA-C  pantoprazole (PROTONIX) 20 MG tablet Take 1 tablet (20 mg total) by mouth daily. Patient not taking: Reported on 12/02/2020 11/24/19   Renne Crigler, PA-C  potassium chloride (KLOR-CON) 10 MEQ tablet Take 1 tablet (10 mEq total) by mouth daily. Patient not taking: Reported on 12/02/2020 11/24/19   Renne Crigler, PA-C  promethazine (PHENERGAN) 25 MG tablet Take 1 tablet (25 mg total) by mouth every 6 (six) hours as needed for nausea or vomiting. Patient not taking: Reported on 12/02/2020 11/24/19   Renne Crigler, PA-C  promethazine-dextromethorphan (PROMETHAZINE-DM) 6.25-15 MG/5ML syrup Take 5 mLs by mouth at bedtime as needed for  cough. 01/04/21   Wallis Bamberg, PA-C    Allergies    Tea  Review of Systems   Review of Systems  Musculoskeletal:  Positive for back pain.  All other systems reviewed and are negative.  Physical Exam Updated Vital Signs BP (!) 158/100 (BP Location: Left Arm)    Pulse 99    Temp 98.7 F (37.1 C) (Oral)    Resp 20    Ht 6\' 5"  (1.956 m)    Wt (!) 191.4 kg    SpO2 98%    BMI 50.04 kg/m   Physical Exam Vitals and nursing note reviewed.  Constitutional:      General: He is not in acute distress.    Appearance: Normal appearance. He is not ill-appearing.  HENT:     Head: Normocephalic and atraumatic.  Pulmonary:     Effort: Pulmonary effort is normal.  Musculoskeletal:        General: Normal range of motion.     Comments: There is tenderness to palpation in the soft tissues of the left lower lumbar region.  There is no bony tenderness or step-off.  Neurological:     Mental Status: He is alert and oriented to person, place, and time.     Comments: Strength is 5 out of 5 in both lower extremities.  Reflexes are trace and symmetrical in both lower extremities.  Is able to ambulate, but with antalgic gait.    ED Results / Procedures / Treatments   Labs (all labs ordered are listed, but only abnormal results are displayed) Labs Reviewed - No data to display  EKG None  Radiology No results found.  Procedures Procedures   Medications Ordered in ED Medications  predniSONE (DELTASONE) tablet 60 mg (has no administration in time range)  ketorolac (TORADOL) injection 60 mg (has no administration in time range)  oxyCODONE-acetaminophen (PERCOCET/ROXICET) 5-325 MG per tablet 2 tablet (has no administration in time range)    ED Course  I have reviewed the triage vital signs and the nursing notes.  Pertinent labs & imaging results that were available during my care of the patient were reviewed by me and considered in my medical decision making (see chart for details).    MDM  Rules/Calculators/A&P  Patient presenting with radicular low back pain that started acutely while bowling.  He has equal reflexes and strength and no bowel or bladder complaints or other signs or symptoms that would indicate an emergent situation.  Patient to be treated with Toradol, prednisone, and Percocet here, then discharged with prednisone and hydrocodone.  He is to follow-up with primary doctor in 1 week if not improving to discuss physical therapy or imaging studies.  Final Clinical Impression(s) / ED Diagnoses Final diagnoses:  None    Rx / DC Orders ED Discharge Orders     None        Geoffery Lyons, MD 02/02/21 [phone]

## 2021-02-02 NOTE — Discharge Instructions (Addendum)
Begin taking prednisone as prescribed.  Begin taking hydrocodone as prescribed as needed for pain.  Follow-up with your primary doctor if not improving in the next week.

## 2021-06-14 ENCOUNTER — Encounter (HOSPITAL_BASED_OUTPATIENT_CLINIC_OR_DEPARTMENT_OTHER): Payer: Self-pay | Admitting: Urology

## 2021-06-14 ENCOUNTER — Emergency Department (HOSPITAL_BASED_OUTPATIENT_CLINIC_OR_DEPARTMENT_OTHER): Payer: Self-pay

## 2021-06-14 ENCOUNTER — Other Ambulatory Visit: Payer: Self-pay

## 2021-06-14 ENCOUNTER — Emergency Department (HOSPITAL_BASED_OUTPATIENT_CLINIC_OR_DEPARTMENT_OTHER)
Admission: EM | Admit: 2021-06-14 | Discharge: 2021-06-14 | Disposition: A | Discharge status: Home / Self Care | Payer: Self-pay | Attending: Emergency Medicine | Admitting: Emergency Medicine

## 2021-06-14 DIAGNOSIS — M545 Low back pain, unspecified: Secondary | ICD-10-CM | POA: Insufficient documentation

## 2021-06-14 DIAGNOSIS — R079 Chest pain, unspecified: Secondary | ICD-10-CM

## 2021-06-14 DIAGNOSIS — I10 Essential (primary) hypertension: Secondary | ICD-10-CM | POA: Insufficient documentation

## 2021-06-14 DIAGNOSIS — R519 Headache, unspecified: Secondary | ICD-10-CM

## 2021-06-14 DIAGNOSIS — Z79899 Other long term (current) drug therapy: Secondary | ICD-10-CM | POA: Insufficient documentation

## 2021-06-14 DIAGNOSIS — R059 Cough, unspecified: Secondary | ICD-10-CM | POA: Insufficient documentation

## 2021-06-14 LAB — CBC
HCT: 45.6 % (ref 39.0–52.0)
Hemoglobin: 15.4 g/dL (ref 13.0–17.0)
MCH: 29.6 pg (ref 26.0–34.0)
MCHC: 33.8 g/dL (ref 30.0–36.0)
MCV: 87.7 fL (ref 80.0–100.0)
Platelets: 221 10*3/uL (ref 150–400)
RBC: 5.2 MIL/uL (ref 4.22–5.81)
RDW: 13.2 % (ref 11.5–15.5)
WBC: 10.4 10*3/uL (ref 4.0–10.5)
nRBC: 0 % (ref 0.0–0.2)

## 2021-06-14 LAB — BASIC METABOLIC PANEL
Anion gap: 6 (ref 5–15)
BUN: 9 mg/dL (ref 6–20)
CO2: 28 mmol/L (ref 22–32)
Calcium: 8.9 mg/dL (ref 8.9–10.3)
Chloride: 105 mmol/L (ref 98–111)
Creatinine, Ser: 1.17 mg/dL (ref 0.61–1.24)
GFR, Estimated: >60 mL/min (ref >60)
Glucose, Bld: 124 mg/dL — ABNORMAL HIGH (ref 70–99)
Potassium: 3.6 mmol/L (ref 3.5–5.1)
Sodium: 139 mmol/L (ref 135–145)

## 2021-06-14 LAB — TROPONIN I (HIGH SENSITIVITY): Troponin I (High Sensitivity): 6 ng/L (ref <18)

## 2021-06-14 MED ORDER — METHOCARBAMOL 500 MG PO TABS: 500.0000 mg | ORAL_TABLET | Freq: Three times a day (TID) | ORAL | 0 refills | Status: DC | PRN

## 2021-06-14 MED ORDER — LISINOPRIL-HYDROCHLOROTHIAZIDE 20-25 MG PO TABS: 1.0000 | ORAL_TABLET | Freq: Every day | ORAL | 2 refills | Status: DC

## 2021-06-14 MED ORDER — KETOROLAC TROMETHAMINE 30 MG/ML IJ SOLN
15.0000 mg | Freq: Once | INTRAMUSCULAR | Status: AC
  Administered 2021-06-14: 15 mg via INTRAVENOUS
  Filled 2021-06-14: qty 1

## 2021-06-14 MED ORDER — METHOCARBAMOL 500 MG PO TABS
500.0000 mg | ORAL_TABLET | Freq: Once | ORAL | Status: AC
  Administered 2021-06-14: 500 mg via ORAL
  Filled 2021-06-14: qty 1

## 2021-06-14 NOTE — ED Notes (Signed)
Rx x 2 given  Written and verbal inst to pt  Verbalized an understanding  To home  

## 2021-06-14 NOTE — ED Provider Notes (Signed)
?[facility] EMERGENCY DEPARTMENT ?Provider Note ? ? ?CSN: [id number] ?Arrival date & time: 06/14/21  1850 ? ?  ? ?History ? ?Chief Complaint  ?Patient presents with  ? Chest Pain  ? Dizziness  ? ? ?[name] [name] is a 41 y.o. male. ? ? ?Chest Pain ?Associated symptoms: back pain and dizziness   ?Dizziness ?Associated symptoms: chest pain   ?Patient presents with low back pain.  States began after having a cough.  States then developed some chest pain dizziness and a headache.  No numbness weakness.  States he also ran out of his lisinopril.  States that he took the dose today but is out early because he lost some of the pills when he was traveling.  Pain is dull.  Worse with certain movements in the back.  Tends to get headaches sometimes with his chest pain. ?  ?Past Medical History:  ?Diagnosis Date  ? Hypercholesteremia   ? Hypertension   ? Stroke [facility])   ? Stroke [facility]) 2012  ? ? ?Home Medications ?Prior to Admission medications   ?Medication Sig Start Date End Date Taking? Authorizing Provider  ?methocarbamol (ROBAXIN) 500 MG tablet Take 1 tablet (500 mg total) by mouth every 8 (eight) hours as needed for muscle spasms. 06/14/21  Yes Benjiman Core, MD  ?Ascorbic Acid (VITAMIN C PO) Take by mouth.    [provider]  ?benzonatate (TESSALON) 100 MG capsule Take 1-2 capsules (100-200 mg total) by mouth 3 (three) times daily as needed for cough. 01/04/21   Wallis Bamberg, PA-C  ?Blood Pressure Monitor DEVI Use as directed to check home blood pressure 2-3 times a week 07/19/18   Marcine Matar, MD  ?calcium carbonate (TUMS - DOSED IN MG ELEMENTAL CALCIUM) 500 MG chewable tablet Chew 1 tablet by mouth daily.    [provider]  ?cetirizine (ZYRTEC ALLERGY) 10 MG tablet Take 1 tablet (10 mg total) by mouth daily. 01/04/21   Wallis Bamberg, PA-C  ?diclofenac sodium (VOLTAREN) 1 % GEL Apply 2 g topically 4 (four) times daily. ?Patient not taking: Reported on 12/02/2020 07/19/18   Marcine Matar, MD  ?HYDROcodone-acetaminophen (NORCO/VICODIN) 5-325 MG tablet Take 1-2 tablets by mouth every 6 (six) hours as needed. 02/02/21   Geoffery Lyons, MD  ?ipratropium (ATROVENT) 0.03 % nasal spray Place 2 sprays into both nostrils 2 (two) times daily. 01/04/21   Wallis Bamberg, PA-C  ?lisinopril-hydrochlorothiazide (ZESTORETIC) 20-25 MG tablet Take 1 tablet by mouth daily. 06/14/21   Benjiman Core, MD  ?meloxicam (MOBIC) 7.5 MG tablet Take 1 tablet (7.5 mg total) by mouth daily. ?Patient not taking: Reported on 12/02/2020 01/24/20   Belinda Fisher, PA-C  ?pantoprazole (PROTONIX) 20 MG tablet Take 1 tablet (20 mg total) by mouth daily. ?Patient not taking: Reported on 12/02/2020 11/24/19   Renne Crigler, PA-C  ?potassium chloride (KLOR-CON) 10 MEQ tablet Take 1 tablet (10 mEq total) by mouth daily. ?Patient not taking: Reported on 12/02/2020 11/24/19   Renne Crigler, PA-C  ?predniSONE (DELTASONE) 10 MG tablet Take 2 tablets (20 mg total) by mouth 2 (two) times daily with a meal. 02/02/21   Geoffery Lyons, MD  ?promethazine (PHENERGAN) 25 MG tablet Take 1 tablet (25 mg total) by mouth every 6 (six) hours as needed for nausea or vomiting. ?Patient not taking: Reported on 12/02/2020 11/24/19   Renne Crigler, PA-C  ?promethazine-dextromethorphan (PROMETHAZINE-DM) 6.25-15 MG/5ML syrup Take 5 mLs by mouth at bedtime as needed for cough. 01/04/21   Wallis Bamberg,  PA-C  ?   ? ?Allergies    ?Tea   ? ?Review of Systems   ?Review of Systems  ?Constitutional:  Negative for appetite change.  ?Cardiovascular:  Positive for chest pain.  ?Gastrointestinal:  Negative for anal bleeding.  ?Musculoskeletal:  Positive for back pain.  ?Neurological:  Positive for dizziness.  ? ?Physical Exam ?Updated Vital Signs ?BP (!) 137/94 (BP Location: Right Arm)   Pulse 73   Temp 98.8 ?F (37.1 ?C) (Oral)   Resp 16   Ht 6\' 4"  (1.93 m)   Wt (!) 190.5 kg   SpO2 100%   BMI 51.12 kg/m?  ?Physical Exam ?Vitals and nursing note reviewed.   ?Constitutional:   ?   Appearance: He is obese.  ?Cardiovascular:  ?   Rate and Rhythm: Regular rhythm.  ?Pulmonary:  ?   Breath sounds: No decreased breath sounds.  ?Chest:  ?   Chest wall: No tenderness.  ?Abdominal:  ?   Tenderness: There is no abdominal tenderness.  ?Musculoskeletal:  ?   Cervical back: Neck supple.  ?   Right lower leg: No edema.  ?   Left lower leg: No edema.  ?Skin: ?   General: Skin is warm.  ?Neurological:  ?   Mental Status: He is alert.  ? ? ?ED Results / Procedures / Treatments   ?Labs ?(all labs ordered are listed, but only abnormal results are displayed) ?Labs Reviewed  ?BASIC METABOLIC PANEL - Abnormal; Notable for the following components:  ?    Result Value  ? Glucose, Bld 124 (*)   ? All other components within normal limits  ?CBC  ?TROPONIN I (HIGH SENSITIVITY)  ? ? ?EKG ?EKG Interpretation ? ?Date/Time:  Tuesday Jun 14 2021 19:02:24 EDT ?Ventricular Rate:  76 ?PR Interval:  151 ?QRS Duration: 87 ?QT Interval:  369 ?QTC Calculation: 415 ?R Axis:   46 ?Text Interpretation: duplicate Confirmed by [birth year] [phone]) on 06/14/2021 7:14:45 PM ? ?Radiology ?DG Chest 2 View ? ?Result Date: 06/14/2021 ?CLINICAL DATA:  Chest pain, dizziness EXAM: CHEST - 2 VIEW COMPARISON:  10/10/2019 FINDINGS: The heart size and mediastinal contours are within normal limits. Both lungs are clear. No pleural effusion or pneumothorax. No acute osseous abnormality. IMPRESSION: No acute process in the chest. Electronically Signed   By: 10/12/2019 M.D.   On: 06/14/2021 19:25   ? ?Procedures ?Procedures  ? ? ?Medications Ordered in ED ?Medications  ?ketorolac (TORADOL) 30 MG/ML injection 15 mg (15 mg Intravenous Given 06/14/21 1942)  ?methocarbamol (ROBAXIN) tablet 500 mg (500 mg Oral Given 06/14/21 1954)  ? ? ?ED Course/ Medical Decision Making/ A&P ?  ?                        ?Medical Decision Making ?Amount and/or Complexity of Data Reviewed ?Labs: ordered. ?Radiology: ordered. ? ?Risk ?Prescription drug  management. ? ? ?Patient presents with complaints of chest pain back pain dizziness headache.  Back pain began after a cough.  Then everything else started to hurt.  History of hypertension.  States he ran out of his blood pressure medicines but did take his last dose today.  Feels better after treatment.  Blood pressures improved some.  Headache improved some to but still present.  Chest x-ray reassuring.  Blood work also reassuring.  Doubt cardiac ischemia as the cause of the pain.  Doubt stroke or intracranial hemorrhage.  Refilled patient's blood pressure medicine and will add muscle relaxer  to help with the back pain.  Appears stable for discharge home with outpatient follow-up. ? ? ? ? ? ? ? ?Final Clinical Impression(s) / ED Diagnoses ?Final diagnoses:  ?Acute low back pain without sciatica, unspecified back pain laterality  ?Nonspecific chest pain  ?Generalized headache  ?Hypertension, unspecified type  ? ? ?Rx / DC Orders ?ED Discharge Orders   ? ?      Ordered  ?  lisinopril-hydrochlorothiazide (ZESTORETIC) 20-25 MG tablet  Daily       ? 06/14/21 2105  ?  methocarbamol (ROBAXIN) 500 MG tablet  Every 8 hours PRN       ? 06/14/21 2106  ? ?  ?  ? ?  ? ? ?  ?Benjiman CorePickering, Herminio Kniskern, MD ?06/14/21 2330 ? ?

## 2021-06-14 NOTE — ED Triage Notes (Signed)
Headache througout the day ?States Chest pain and dizziness that started 15 min PTA  ?States "back pain after cough today" as well  ?

## 2021-08-15 ENCOUNTER — Emergency Department (HOSPITAL_BASED_OUTPATIENT_CLINIC_OR_DEPARTMENT_OTHER)
Admission: EM | Admit: 2021-08-15 | Discharge: 2021-08-15 | Disposition: A | Discharge status: Home / Self Care | Payer: Commercial Managed Care - HMO | Attending: Emergency Medicine | Admitting: Emergency Medicine

## 2021-08-15 ENCOUNTER — Other Ambulatory Visit: Payer: Self-pay

## 2021-08-15 DIAGNOSIS — D72829 Elevated white blood cell count, unspecified: Secondary | ICD-10-CM | POA: Diagnosis not present

## 2021-08-15 DIAGNOSIS — I10 Essential (primary) hypertension: Secondary | ICD-10-CM | POA: Diagnosis not present

## 2021-08-15 DIAGNOSIS — L0231 Cutaneous abscess of buttock: Secondary | ICD-10-CM | POA: Diagnosis present

## 2021-08-15 LAB — CBC WITH DIFFERENTIAL/PLATELET
Abs Immature Granulocytes: 0.04 10*3/uL (ref 0.00–0.07)
Basophils Absolute: 0.1 10*3/uL (ref 0.0–0.1)
Basophils Relative: 1 %
Eosinophils Absolute: 0.3 10*3/uL (ref 0.0–0.5)
Eosinophils Relative: 2 %
HCT: 47.3 % (ref 39.0–52.0)
Hemoglobin: 16.2 g/dL (ref 13.0–17.0)
Immature Granulocytes: 0 %
Lymphocytes Relative: 27 %
Lymphs Abs: 3.5 10*3/uL (ref 0.7–4.0)
MCH: 29.1 pg (ref 26.0–34.0)
MCHC: 34.2 g/dL (ref 30.0–36.0)
MCV: 85.1 fL (ref 80.0–100.0)
Monocytes Absolute: 1.2 10*3/uL — ABNORMAL HIGH (ref 0.1–1.0)
Monocytes Relative: 9 %
Neutro Abs: 7.9 10*3/uL — ABNORMAL HIGH (ref 1.7–7.7)
Neutrophils Relative %: 61 %
Platelets: 247 10*3/uL (ref 150–400)
RBC: 5.56 MIL/uL (ref 4.22–5.81)
RDW: 13.1 % (ref 11.5–15.5)
WBC: 12.9 10*3/uL — ABNORMAL HIGH (ref 4.0–10.5)
nRBC: 0 % (ref 0.0–0.2)

## 2021-08-15 LAB — COMPREHENSIVE METABOLIC PANEL
ALT: 78 U/L — ABNORMAL HIGH (ref 0–44)
AST: 58 U/L — ABNORMAL HIGH (ref 15–41)
Albumin: 3.9 g/dL (ref 3.5–5.0)
Alkaline Phosphatase: 80 U/L (ref 38–126)
Anion gap: 8 (ref 5–15)
BUN: 9 mg/dL (ref 6–20)
CO2: 30 mmol/L (ref 22–32)
Calcium: 9.2 mg/dL (ref 8.9–10.3)
Chloride: 101 mmol/L (ref 98–111)
Creatinine, Ser: 1.15 mg/dL (ref 0.61–1.24)
GFR, Estimated: >60 mL/min (ref >60)
Glucose, Bld: 99 mg/dL (ref 70–99)
Potassium: 3.2 mmol/L — ABNORMAL LOW (ref 3.5–5.1)
Sodium: 139 mmol/L (ref 135–145)
Total Bilirubin: 0.7 mg/dL (ref 0.3–1.2)
Total Protein: 7.8 g/dL (ref 6.5–8.1)

## 2021-08-15 MED ORDER — OXYCODONE-ACETAMINOPHEN 5-325 MG PO TABS: 1.0000 | ORAL_TABLET | Freq: Four times a day (QID) | ORAL | 0 refills | Status: DC | PRN

## 2021-08-15 MED ORDER — DOXYCYCLINE HYCLATE 100 MG PO CAPS: 100.0000 mg | ORAL_CAPSULE | Freq: Two times a day (BID) | ORAL | 0 refills | Status: DC

## 2021-08-15 MED ORDER — SENNOSIDES-DOCUSATE SODIUM 8.6-50 MG PO TABS: 1.0000 | ORAL_TABLET | Freq: Every evening | ORAL | 0 refills | Status: DC | PRN

## 2021-08-15 MED ORDER — OXYCODONE-ACETAMINOPHEN 5-325 MG PO TABS
1.0000 | ORAL_TABLET | ORAL | Status: DC | PRN
  Administered 2021-08-15: 1 via ORAL
  Filled 2021-08-15: qty 1

## 2021-08-15 MED ORDER — LIDOCAINE-EPINEPHRINE (PF) 2 %-1:200000 IJ SOLN
10.0000 mL | Freq: Once | INTRAMUSCULAR | Status: AC
  Administered 2021-08-15: 3 mL
  Filled 2021-08-15: qty 20

## 2021-08-15 MED ORDER — DOXYCYCLINE HYCLATE 100 MG PO TABS
100.0000 mg | ORAL_TABLET | Freq: Once | ORAL | Status: AC
  Administered 2021-08-15: 100 mg via ORAL
  Filled 2021-08-15: qty 1

## 2021-08-15 MED ORDER — DOXYCYCLINE HYCLATE 100 MG PO CAPS
100.0000 mg | ORAL_CAPSULE | Freq: Two times a day (BID) | ORAL | 0 refills | Status: AC
Start: 2021-08-15 — End: 2021-08-22

## 2021-08-15 MED ORDER — OXYCODONE-ACETAMINOPHEN 5-325 MG PO TABS
1.0000 | ORAL_TABLET | Freq: Once | ORAL | Status: AC
  Administered 2021-08-15: 1 via ORAL
  Filled 2021-08-15: qty 1

## 2021-08-15 NOTE — ED Triage Notes (Signed)
Abscess on left butt cheek;

## 2021-08-15 NOTE — ED Provider Notes (Signed)
Emergency Department Provider Note   I have reviewed the triage vital signs and the nursing notes.   HISTORY  Chief Complaint Abscess   HPI [name] [name] is a 41 y.o. male with PMH of gluteal abscess presents to the ED with recurrent left gluteal abscess. Has had pain worsening over the last several days. No fever. No pain with BMs. Patient has required I&D in this location in the past. No abdominal or flank pain. Pain is moderate to severe at times. No bleeding.    Past Medical History:  Diagnosis Date   Hypercholesteremia    Hypertension    Stroke [facility])    Stroke [facility]) 2012    Review of Systems  Constitutional: No fever/chills Cardiovascular: Denies chest pain. Respiratory: Denies shortness of breath. Gastrointestinal: No abdominal pain.  No nausea, no vomiting.  Genitourinary: Negative for dysuria. Musculoskeletal: Negative for back pain. Skin: Positive left gluteal abscess.  Neurological: Negative for headaches.   ____________________________________________   PHYSICAL EXAM:  VITAL SIGNS: ED Triage Vitals  Enc Vitals Group     BP 08/15/21 1751 (!) 138/91     Pulse Rate 08/15/21 1751 82     Resp 08/15/21 1751 15     Temp 08/15/21 1751 99.4 F (37.4 C)     Temp Source 08/15/21 1751 Oral     SpO2 08/15/21 1751 97 %     Weight 08/15/21 1754 (!) 400 lb (181.4 kg)     Height 08/15/21 1754 6\' 4"  (1.93 m)   Constitutional: Alert and oriented. Well appearing and in no acute distress. Eyes: Conjunctivae are normal.  Head: Atraumatic. Nose: No congestion/rhinnorhea. Mouth/Throat: Mucous membranes are moist.  Neck: No stridor. Cardiovascular: Normal rate, regular rhythm. Good peripheral circulation. Grossly normal heart sounds.   Respiratory: Normal respiratory effort.  No retractions. Lungs CTAB. Gastrointestinal: Soft and nontender. No distention.  Musculoskeletal: No lower extremity tenderness nor edema. No gross deformities of  extremities. Neurologic:  Normal speech and language. No gross focal neurologic deficits are appreciated.  Skin:  Skin is warm and dry. Focal area of tenderness and fluctuance to the left gluteal region just lateral of the gluteal cleft. No drainage or bleeding.   ____________________________________________   LABS (all labs ordered are listed, but only abnormal results are displayed)  Labs Reviewed  CBC WITH DIFFERENTIAL/PLATELET - Abnormal; Notable for the following components:      Result Value   WBC 12.9 (*)    Neutro Abs 7.9 (*)    Monocytes Absolute 1.2 (*)    All other components within normal limits  COMPREHENSIVE METABOLIC PANEL - Abnormal; Notable for the following components:   Potassium 3.2 (*)    AST 58 (*)    ALT 78 (*)    All other components within normal limits    ____________________________________________   PROCEDURES  Procedure(s) performed:   Marland KitchenIncision and Drainage  Date/Time: 08/16/2021 9:25 AM  Performed by: 10/17/2021, MD Authorized by: Maia Plan, MD   Consent:    Consent obtained:  Verbal   Consent given by:  Patient   Risks, benefits, and alternatives were discussed: yes     Risks discussed:  Bleeding, damage to other organs, incomplete drainage, infection and pain Universal protocol:    Patient identity confirmed:  Verbally with patient Location:    Type:  Abscess   Size:  4   Location:  Anogenital   Anogenital location:  Gluteal cleft Pre-procedure details:    Skin preparation:  Povidone-iodine  Sedation:    Sedation type:  None Anesthesia:    Anesthesia method:  Local infiltration   Local anesthetic:  Lidocaine 2% WITH epi Procedure type:    Complexity:  Complex Procedure details:    Ultrasound guidance: no     Needle aspiration: no     Incision types:  Single straight   Incision depth:  Subcutaneous   Wound management:  Probed and deloculated   Drainage:  Purulent   Drainage amount:  Moderate   Wound treatment:   Wound left open   Packing materials:  1/4 in iodoform gauze   Amount 1/4" iodoform:  10 cm Post-procedure details:    Procedure completion:  Tolerated well, no immediate complications    ____________________________________________   INITIAL IMPRESSION / ASSESSMENT AND PLAN / ED COURSE  Pertinent labs & imaging results that were available during my care of the patient were reviewed by me and considered in my medical decision making (see chart for details).   This patient is Presenting for Evaluation of gluteal pain, which does require a range of treatment options, and is a complaint that involves a high risk of morbidity and mortality.  The Differential Diagnoses include abscess, cellulitis, gangrene, osteomyelitis, sepsis.  Critical Interventions-    Medications  lidocaine-EPINEPHrine (XYLOCAINE W/EPI) 2 %-1:200000 (PF) injection 10 mL (3 mLs Infiltration Given by Other 08/15/21 2040)  oxyCODONE-acetaminophen (PERCOCET/ROXICET) 5-325 MG per tablet 1 tablet (1 tablet Oral Given 08/15/21 2039)  doxycycline (VIBRA-TABS) tablet 100 mg (100 mg Oral Given 08/15/21 2134)    Reassessment after intervention: Symptoms improved after I&D.   I did obtain Additional Historical Information from SO at bedside.  I decided to review pertinent External Data, and in summary patient with prior I&D in the past.   Clinical Laboratory Tests Ordered, included mild leukocytosis on CBC. No anemia. No AKI. Mild hypokalemia.   Radiologic Tests: Considered CT imaging of the pelvis but abscess well appreciated on physical exam. Defer imaging for now.   Social Determinants of Health Risk patient with a smoking history.   Medical Decision Making: Summary:  Patient with recurrent gluteal abscess. I&D performed and pain improved. Packing placed and will start on abx.   Reevaluation with update and discussion with patient. Discussed wound mgmt and ED return in 48 hours for wound re-evaluation.   Considered  admission but I&D successful. Discussed wound mgmt and gave contact information for general surgery given the recurrent nature of this abscess.   Disposition: discharge  ____________________________________________  FINAL CLINICAL IMPRESSION(S) / ED DIAGNOSES  Final diagnoses:  Gluteal abscess    NEW OUTPATIENT MEDICATIONS STARTED DURING THIS VISIT:  Discharge Medication List as of 08/15/2021  9:30 PM     START taking these medications   Details  oxyCODONE-acetaminophen (PERCOCET/ROXICET) 5-325 MG tablet Take 1 tablet by mouth every 6 (six) hours as needed for severe pain., Starting Mon 08/15/2021, Normal    senna-docusate (SENOKOT-S) 8.6-50 MG tablet Take 1 tablet by mouth at bedtime as needed for mild constipation or moderate constipation., Starting Mon 08/15/2021, Normal      Doxycycline Rx sent to Pharmacy as well.   Note:  This document was prepared using Dragon voice recognition software and may include unintentional dictation errors.  Alona Bene, MD, [facility]    Theophile Harvie, Arlyss Repress, MD 08/16/21 [phone]

## 2021-08-15 NOTE — Discharge Instructions (Addendum)
You have been seen in the Emergency Department (ED) today for an abscess.  This was drained in the ED.  Please follow up with your doctor or in the ED in 24-48 hours for recheck of your wound.  Read through the additional discharge instructions included below regarding wound care recommendations.  Keep the wound clean and dry, though you may wash as you would normally.  Change the dressing twice daily.  Call your doctor sooner or return to the ED if you develop worsening signs of infection such as: increased redness, increased pain, pus, or fever.      

## 2021-08-15 NOTE — ED Notes (Signed)
Patient verbalizes understanding of discharge instructions. Opportunity for questioning and answers were provided. Armband removed by staff, pt discharged from ED. Ambulated out to lobby  

## 2021-08-18 ENCOUNTER — Emergency Department (HOSPITAL_BASED_OUTPATIENT_CLINIC_OR_DEPARTMENT_OTHER)
Admission: EM | Admit: 2021-08-18 | Discharge: 2021-08-18 | Disposition: A | Discharge status: Home / Self Care | Payer: Commercial Managed Care - HMO | Attending: Emergency Medicine | Admitting: Emergency Medicine

## 2021-08-18 ENCOUNTER — Encounter (HOSPITAL_BASED_OUTPATIENT_CLINIC_OR_DEPARTMENT_OTHER): Payer: Self-pay | Admitting: Emergency Medicine

## 2021-08-18 ENCOUNTER — Other Ambulatory Visit: Payer: Self-pay

## 2021-08-18 DIAGNOSIS — Z79899 Other long term (current) drug therapy: Secondary | ICD-10-CM | POA: Diagnosis not present

## 2021-08-18 DIAGNOSIS — L0231 Cutaneous abscess of buttock: Secondary | ICD-10-CM | POA: Diagnosis present

## 2021-08-18 DIAGNOSIS — L0291 Cutaneous abscess, unspecified: Secondary | ICD-10-CM

## 2021-08-18 NOTE — ED Triage Notes (Signed)
Pt seen for abscess on 7/3 on left buttocks. Here for reevaluation. States area has not improved or worsened. Has appointment with general surgery on 7/31.

## 2021-08-18 NOTE — Discharge Instructions (Addendum)
Continue soap and water.  Tylenol ibuprofen as needed for pain.  Follow-up with general surgery.  Return if any worsening or concerning symptoms

## 2021-08-18 NOTE — ED Provider Notes (Signed)
[facility] EMERGENCY DEPARTMENT Provider Note   CSN: [id number] Arrival date & time: 08/18/21  1731     History  Chief Complaint  Patient presents with   Abscess    Re-Eval    [name] [name] is a 41 y.o. male.  He is here for abscess recheck.  He had an I&D done a couple of days ago for gluteal cleft abscess.  He is on antibiotics.  He said the wick had fallen out.  He supposed to follow-up with general surgery for definitive treatment but has been unable to see them yet.  No fevers or chills.  Pain is improved not completely resolved  The history is provided by the patient.  Abscess Location:  Pelvis Pelvic abscess location:  Gluteal cleft Abscess quality: painful   Red streaking: no   Progression:  Improving Pain details:    Quality:  Dull   Severity:  Mild   Timing:  Constant   Progression:  Partially resolved Chronicity:  Recurrent Associated symptoms: no fever        Home Medications Prior to Admission medications   Medication Sig Start Date End Date Taking? Authorizing Provider  Ascorbic Acid (VITAMIN C PO) Take by mouth.    [provider]  benzonatate (TESSALON) 100 MG capsule Take 1-2 capsules (100-200 mg total) by mouth 3 (three) times daily as needed for cough. 01/04/21   Wallis Bamberg, PA-C  Blood Pressure Monitor DEVI Use as directed to check home blood pressure 2-3 times a week 07/19/18   Marcine Matar, MD  calcium carbonate (TUMS - DOSED IN MG ELEMENTAL CALCIUM) 500 MG chewable tablet Chew 1 tablet by mouth daily.    [provider]  cetirizine (ZYRTEC ALLERGY) 10 MG tablet Take 1 tablet (10 mg total) by mouth daily. 01/04/21   Wallis Bamberg, PA-C  diclofenac sodium (VOLTAREN) 1 % GEL Apply 2 g topically 4 (four) times daily. Patient not taking: Reported on 12/02/2020 07/19/18   Marcine Matar, MD  doxycycline (VIBRAMYCIN) 100 MG capsule Take 1 capsule (100 mg total) by mouth 2 (two) times daily for 7 days. 08/15/21 08/22/21   Long, Arlyss Repress, MD  HYDROcodone-acetaminophen (NORCO/VICODIN) 5-325 MG tablet Take 1-2 tablets by mouth every 6 (six) hours as needed. 02/02/21   Geoffery Lyons, MD  ipratropium (ATROVENT) 0.03 % nasal spray Place 2 sprays into both nostrils 2 (two) times daily. 01/04/21   Wallis Bamberg, PA-C  lisinopril-hydrochlorothiazide (ZESTORETIC) 20-25 MG tablet Take 1 tablet by mouth daily. 06/14/21   Benjiman Core, MD  meloxicam (MOBIC) 7.5 MG tablet Take 1 tablet (7.5 mg total) by mouth daily. Patient not taking: Reported on 12/02/2020 01/24/20   Belinda Fisher, PA-C  methocarbamol (ROBAXIN) 500 MG tablet Take 1 tablet (500 mg total) by mouth every 8 (eight) hours as needed for muscle spasms. 06/14/21   Benjiman Core, MD  oxyCODONE-acetaminophen (PERCOCET/ROXICET) 5-325 MG tablet Take 1 tablet by mouth every 6 (six) hours as needed for severe pain. 08/15/21   Long, Arlyss Repress, MD  pantoprazole (PROTONIX) 20 MG tablet Take 1 tablet (20 mg total) by mouth daily. Patient not taking: Reported on 12/02/2020 11/24/19   Renne Crigler, PA-C  potassium chloride (KLOR-CON) 10 MEQ tablet Take 1 tablet (10 mEq total) by mouth daily. Patient not taking: Reported on 12/02/2020 11/24/19   Renne Crigler, PA-C  predniSONE (DELTASONE) 10 MG tablet Take 2 tablets (20 mg total) by mouth 2 (two) times daily with a meal. 02/02/21  Geoffery Lyons, MD  promethazine (PHENERGAN) 25 MG tablet Take 1 tablet (25 mg total) by mouth every 6 (six) hours as needed for nausea or vomiting. Patient not taking: Reported on 12/02/2020 11/24/19   Renne Crigler, PA-C  promethazine-dextromethorphan (PROMETHAZINE-DM) 6.25-15 MG/5ML syrup Take 5 mLs by mouth at bedtime as needed for cough. 01/04/21   Wallis Bamberg, PA-C  senna-docusate (SENOKOT-S) 8.6-50 MG tablet Take 1 tablet by mouth at bedtime as needed for mild constipation or moderate constipation. 08/15/21   Long, Arlyss Repress, MD      Allergies    Tea    Review of Systems   Review of Systems   Constitutional:  Negative for fever.  Skin:  Positive for wound.    Physical Exam Updated Vital Signs BP 129/85 (BP Location: Right Arm)   Pulse 79   Temp 98.9 F (37.2 C) (Oral)   Resp 18   SpO2 100%  Physical Exam Vitals and nursing note reviewed.  Constitutional:      Appearance: Normal appearance. He is well-developed. He is obese.  HENT:     Head: Normocephalic and atraumatic.  Eyes:     Conjunctiva/sclera: Conjunctivae normal.  Pulmonary:     Effort: Pulmonary effort is normal.  Genitourinary:    Comments: Gluteal cleft has a small skin defect.  No induration or erythema.  No active drainage. Musculoskeletal:     Cervical back: Neck supple.  Skin:    General: Skin is warm and dry.  Neurological:     Mental Status: He is alert.     GCS: GCS eye subscore is 4. GCS verbal subscore is 5. GCS motor subscore is 6.     ED Results / Procedures / Treatments   Labs (all labs ordered are listed, but only abnormal results are displayed) Labs Reviewed - No data to display  EKG None  Radiology No results found.  Procedures Procedures    Medications Ordered in ED Medications - No data to display  ED Course/ Medical Decision Making/ A&P                           Medical Decision Making          Final Clinical Impression(s) / ED Diagnoses Final diagnoses:  Abscess    Rx / DC Orders ED Discharge Orders     None         Terrilee Files, MD 08/19/21 [phone]

## 2021-11-19 ENCOUNTER — Encounter (HOSPITAL_BASED_OUTPATIENT_CLINIC_OR_DEPARTMENT_OTHER): Payer: Self-pay | Admitting: Emergency Medicine

## 2021-11-19 ENCOUNTER — Emergency Department (HOSPITAL_BASED_OUTPATIENT_CLINIC_OR_DEPARTMENT_OTHER)
Admission: EM | Admit: 2021-11-19 | Discharge: 2021-11-20 | Disposition: A | Discharge status: Home / Self Care | Payer: Commercial Managed Care - HMO | Attending: Emergency Medicine | Admitting: Emergency Medicine

## 2021-11-19 ENCOUNTER — Emergency Department (HOSPITAL_BASED_OUTPATIENT_CLINIC_OR_DEPARTMENT_OTHER): Payer: Commercial Managed Care - HMO

## 2021-11-19 ENCOUNTER — Other Ambulatory Visit: Payer: Self-pay

## 2021-11-19 DIAGNOSIS — W109XXA Fall (on) (from) unspecified stairs and steps, initial encounter: Secondary | ICD-10-CM | POA: Insufficient documentation

## 2021-11-19 DIAGNOSIS — M79672 Pain in left foot: Secondary | ICD-10-CM | POA: Diagnosis not present

## 2021-11-19 DIAGNOSIS — Z760 Encounter for issue of repeat prescription: Secondary | ICD-10-CM | POA: Diagnosis not present

## 2021-11-19 DIAGNOSIS — Z79899 Other long term (current) drug therapy: Secondary | ICD-10-CM | POA: Diagnosis not present

## 2021-11-19 DIAGNOSIS — I10 Essential (primary) hypertension: Secondary | ICD-10-CM | POA: Insufficient documentation

## 2021-11-19 NOTE — ED Triage Notes (Signed)
Patient arrived via POV c/o foot pain/injury x 1 day. Patient states yesterday he "stepped down wrong" and felt pain in the upper mid portion of his left foot. Patient states swelling yesterday, but not today. Patient states pain while walking, but ambulated to triage. Patient is AO x 4, VS w/ elevated BP, slow gait.

## 2021-11-20 MED ORDER — NAPROXEN 500 MG PO TABS: 500.0000 mg | ORAL_TABLET | Freq: Two times a day (BID) | ORAL | 0 refills | Status: DC

## 2021-11-20 MED ORDER — ACETAMINOPHEN 500 MG PO TABS
1000.0000 mg | ORAL_TABLET | Freq: Once | ORAL | Status: AC
  Administered 2021-11-20: 1000 mg via ORAL
  Filled 2021-11-20: qty 2

## 2021-11-20 MED ORDER — LIDOCAINE 5 % EX PTCH: 1.0000 | MEDICATED_PATCH | CUTANEOUS | 0 refills | Status: DC

## 2021-11-20 MED ORDER — LISINOPRIL-HYDROCHLOROTHIAZIDE 20-12.5 MG PO TABS: 1.0000 | ORAL_TABLET | Freq: Every day | ORAL | 0 refills | Status: DC

## 2021-11-20 MED ORDER — IBUPROFEN 800 MG PO TABS
800.0000 mg | ORAL_TABLET | Freq: Once | ORAL | Status: AC
  Administered 2021-11-20: 800 mg via ORAL
  Filled 2021-11-20: qty 1

## 2021-11-20 MED ORDER — LIDOCAINE 5 % EX PTCH
1.0000 | MEDICATED_PATCH | CUTANEOUS | Status: DC
  Filled 2021-11-20: qty 1

## 2021-11-20 NOTE — ED Provider Notes (Addendum)
[facility] EMERGENCY DEPARTMENT Provider Note   CSN: [id number] Arrival date & time: 11/19/21  2328     History  Chief Complaint  Patient presents with   Foot Pain    [name] [name] is a 41 y.o. male.  The history is provided by the patient.  Foot Pain This is a new problem. The current episode started yesterday. The problem occurs constantly. The problem has not changed since onset.Nothing aggravates the symptoms. Nothing relieves the symptoms. He has tried nothing for the symptoms. The treatment provided no relief.       Home Medications Prior to Admission medications   Medication Sig Start Date End Date Taking? Authorizing Provider  Ascorbic Acid (VITAMIN C PO) Take by mouth.    [provider]  benzonatate (TESSALON) 100 MG capsule Take 1-2 capsules (100-200 mg total) by mouth 3 (three) times daily as needed for cough. 01/04/21   Wallis Bamberg, PA-C  Blood Pressure Monitor DEVI Use as directed to check home blood pressure 2-3 times a week 07/19/18   Marcine Matar, MD  calcium carbonate (TUMS - DOSED IN MG ELEMENTAL CALCIUM) 500 MG chewable tablet Chew 1 tablet by mouth daily.    [provider]  cetirizine (ZYRTEC ALLERGY) 10 MG tablet Take 1 tablet (10 mg total) by mouth daily. 01/04/21   Wallis Bamberg, PA-C  diclofenac sodium (VOLTAREN) 1 % GEL Apply 2 g topically 4 (four) times daily. Patient not taking: Reported on 12/02/2020 07/19/18   Marcine Matar, MD  HYDROcodone-acetaminophen (NORCO/VICODIN) 5-325 MG tablet Take 1-2 tablets by mouth every 6 (six) hours as needed. 02/02/21   Geoffery Lyons, MD  ipratropium (ATROVENT) 0.03 % nasal spray Place 2 sprays into both nostrils 2 (two) times daily. 01/04/21   Wallis Bamberg, PA-C  lisinopril-hydrochlorothiazide (ZESTORETIC) 20-25 MG tablet Take 1 tablet by mouth daily. 06/14/21   Benjiman Core, MD  meloxicam (MOBIC) 7.5 MG tablet Take 1 tablet (7.5 mg total) by mouth daily. Patient not taking:  Reported on 12/02/2020 01/24/20   Belinda Fisher, PA-C  methocarbamol (ROBAXIN) 500 MG tablet Take 1 tablet (500 mg total) by mouth every 8 (eight) hours as needed for muscle spasms. 06/14/21   Benjiman Core, MD  oxyCODONE-acetaminophen (PERCOCET/ROXICET) 5-325 MG tablet Take 1 tablet by mouth every 6 (six) hours as needed for severe pain. 08/15/21   Long, Arlyss Repress, MD  pantoprazole (PROTONIX) 20 MG tablet Take 1 tablet (20 mg total) by mouth daily. Patient not taking: Reported on 12/02/2020 11/24/19   Renne Crigler, PA-C  potassium chloride (KLOR-CON) 10 MEQ tablet Take 1 tablet (10 mEq total) by mouth daily. Patient not taking: Reported on 12/02/2020 11/24/19   Renne Crigler, PA-C  predniSONE (DELTASONE) 10 MG tablet Take 2 tablets (20 mg total) by mouth 2 (two) times daily with a meal. 02/02/21   Geoffery Lyons, MD  promethazine (PHENERGAN) 25 MG tablet Take 1 tablet (25 mg total) by mouth every 6 (six) hours as needed for nausea or vomiting. Patient not taking: Reported on 12/02/2020 11/24/19   Renne Crigler, PA-C  promethazine-dextromethorphan (PROMETHAZINE-DM) 6.25-15 MG/5ML syrup Take 5 mLs by mouth at bedtime as needed for cough. 01/04/21   Wallis Bamberg, PA-C  senna-docusate (SENOKOT-S) 8.6-50 MG tablet Take 1 tablet by mouth at bedtime as needed for mild constipation or moderate constipation. 08/15/21   Long, Arlyss Repress, MD      Allergies    Tea    Review of Systems   Review of  Systems  Constitutional:  Negative for fever.  HENT:  Negative for facial swelling.   Eyes:  Negative for visual disturbance.  Respiratory:  Negative for wheezing.   Musculoskeletal:  Positive for arthralgias.  All other systems reviewed and are negative.   Physical Exam Updated Vital Signs BP (!) 164/95 (BP Location: Right Arm)   Pulse 81   Temp 98.1 F (36.7 C) (Oral)   Resp 18   Ht 6\' 5"  (1.956 m)   Wt (!) 181.4 kg   SpO2 96%   BMI 47.43 kg/m  Physical Exam Vitals and nursing note reviewed.   Constitutional:      General: He is not in acute distress.    Appearance: Normal appearance. He is well-developed. He is not diaphoretic.  HENT:     Head: Normocephalic and atraumatic.     Nose: Nose normal.  Eyes:     Conjunctiva/sclera: Conjunctivae normal.     Pupils: Pupils are equal, round, and reactive to light.  Cardiovascular:     Rate and Rhythm: Normal rate and regular rhythm.     Pulses: Normal pulses.     Heart sounds: Normal heart sounds.  Pulmonary:     Effort: Pulmonary effort is normal.     Breath sounds: Normal breath sounds. No wheezing or rales.  Abdominal:     General: Bowel sounds are normal.     Palpations: Abdomen is soft.     Tenderness: There is no abdominal tenderness. There is no guarding.  Musculoskeletal:        General: Normal range of motion.     Cervical back: Normal range of motion and neck supple.     Left lower leg: Normal.     Left ankle: Normal.     Left Achilles Tendon: Normal.     Left foot: Normal. Normal range of motion and normal capillary refill. No prominent metatarsal heads, laceration or crepitus. Normal pulse.     Comments: Intact gait quick and steady     Skin:    General: Skin is warm and dry.  Neurological:     General: No focal deficit present.     Mental Status: He is alert and oriented to person, place, and time.     Deep Tendon Reflexes: Reflexes normal.  Psychiatric:        Mood and Affect: Mood normal.        Behavior: Behavior normal.     ED Results / Procedures / Treatments   Labs (all labs ordered are listed, but only abnormal results are displayed) Labs Reviewed - No data to display  EKG None  Radiology DG Foot Complete Left  Result Date: 11/20/2021 CLINICAL DATA:  Status post trauma 1 day ago per EXAM: LEFT FOOT - COMPLETE 3+ VIEW COMPARISON:  None Available. FINDINGS: There is no evidence of an acute fracture or dislocation. A radiopaque intramedullary rod is seen within the distal left tibia. There is  no evidence of arthropathy or other focal bone abnormality. Soft tissues are unremarkable. IMPRESSION: 1. No acute osseous abnormality. 2. Status post ORIF of the left tibia. Electronically Signed   By: Virgina Norfolk M.D.   On: 11/20/2021 00:15    Procedures Procedures    Medications Ordered in ED Medications - No data to display  ED Course/ Medical Decision Making/ A&P                           Medical Decision Making  Foot pain after stepping off a step.    Amount and/or Complexity of Data Reviewed External Data Reviewed: notes.    Details: Previous notes reviewed  Radiology: ordered and independent interpretation performed.    Details: Negative foot xray   Risk OTC drugs. Prescription drug management. Risk Details: Patient stepped off a step, exam and xray are normal.  Ice for 20 minutes every 2 hours.  Elevation.  Naproxen and tylenol.  Stable for discharge. At discharge need HTN medication refill.  Will give several day supply.      Final Clinical Impression(s) / ED Diagnoses Final diagnoses:  None   Return for intractable cough, coughing up blood, fevers > 100.4 unrelieved by medication, shortness of breath, intractable vomiting, chest pain, shortness of breath, weakness, numbness, changes in speech, facial asymmetry, abdominal pain, passing out, Inability to tolerate liquids or food, cough, altered mental status or any concerns. No signs of systemic illness or infection. The patient is nontoxic-appearing on exam and vital signs are within normal limits.  I have reviewed the triage vital signs and the nursing notes. Pertinent labs & imaging results that were available during my care of the patient were reviewed by me and considered in my medical decision making (see chart for details). After history, exam, and medical workup I feel the patient has been appropriately medically screened and is safe for discharge home. Pertinent diagnoses were discussed with the patient.  Patient was given return precautions.  Rx / DC Orders ED Discharge Orders     None        Mekai Wilkinson, MD 11/20/21 [id number]

## 2021-11-20 NOTE — ED Notes (Signed)
D/c paperwork reviewed with pt, including prescriptions. Pt with request to send prescriptions to different pharmacy, EDP Palumbo made aware. No further questions or concerns voiced by pt at time of d/c. Pt ambulatory with steady gait to ED exit.

## 2021-11-22 ENCOUNTER — Ambulatory Visit: Payer: 59 | Admitting: Family Medicine

## 2021-11-27 ENCOUNTER — Ambulatory Visit (HOSPITAL_COMMUNITY)
Admission: EM | Admit: 2021-11-27 | Discharge: 2021-11-27 | Disposition: A | Discharge status: Home / Self Care | Payer: Commercial Managed Care - HMO | Attending: Internal Medicine | Admitting: Internal Medicine

## 2021-11-27 ENCOUNTER — Encounter (HOSPITAL_COMMUNITY): Payer: Self-pay | Admitting: Emergency Medicine

## 2021-11-27 DIAGNOSIS — Z760 Encounter for issue of repeat prescription: Secondary | ICD-10-CM | POA: Diagnosis not present

## 2021-11-27 DIAGNOSIS — I10 Essential (primary) hypertension: Secondary | ICD-10-CM | POA: Diagnosis not present

## 2021-11-27 DIAGNOSIS — S29019A Strain of muscle and tendon of unspecified wall of thorax, initial encounter: Secondary | ICD-10-CM

## 2021-11-27 MED ORDER — ACETAMINOPHEN 325 MG PO TABS
ORAL_TABLET | ORAL | Status: AC
  Filled 2021-11-27: qty 3

## 2021-11-27 MED ORDER — ACETAMINOPHEN 325 MG PO TABS
975.0000 mg | ORAL_TABLET | Freq: Once | ORAL | Status: AC
  Administered 2021-11-27: 975 mg via ORAL

## 2021-11-27 MED ORDER — LISINOPRIL-HYDROCHLOROTHIAZIDE 20-12.5 MG PO TABS: 1.0000 | ORAL_TABLET | Freq: Every day | ORAL | 1 refills | Status: DC

## 2021-11-27 MED ORDER — TIZANIDINE HCL 4 MG PO TABS: 4.0000 mg | ORAL_TABLET | Freq: Four times a day (QID) | ORAL | 0 refills | Status: DC | PRN

## 2021-11-27 MED ORDER — KETOROLAC TROMETHAMINE 30 MG/ML IJ SOLN
30.0000 mg | Freq: Once | INTRAMUSCULAR | Status: AC
  Administered 2021-11-27: 30 mg via INTRAMUSCULAR

## 2021-11-27 MED ORDER — NAPROXEN 500 MG PO TABS: 500.0000 mg | ORAL_TABLET | Freq: Two times a day (BID) | ORAL | 0 refills | Status: DC

## 2021-11-27 MED ORDER — KETOROLAC TROMETHAMINE 30 MG/ML IJ SOLN
INTRAMUSCULAR | Status: AC
  Filled 2021-11-27: qty 1

## 2021-11-27 NOTE — ED Provider Notes (Signed)
[facility]    CSN: [id number] Arrival date & time: 11/27/21  1324      History   Chief Complaint Chief Complaint  Patient presents with   Back Pain   Medication Refill   Headache    HPI [name] [name] is a 41 y.o. male.   Patient presents urgent care for evaluation of thoracic back pain that started abruptly this morning when he bent forward to pick up his wallet off of the floor, coughed while bending over, and developed grabbing back pain to the mid back upon standing.  Back pain is currently an 8 on a scale of 0-10 and described as a squeezing or grabbing sensation.  Patient states that he can "feel it spasming right now".  Movement makes pain worse.  Pain is tolerable, but uncomfortable.  No urinary symptoms, bowel/bladder incontinence, numbness/tingling to bilateral lower extremities, shortness of breath, cough, chest pain, weakness, or saddle anesthesia symptoms. He has not attempted any use of over-the-counter medications prior to arrival urgent care.   Blood pressure is noticeably elevated in clinic at 183/117.  He just changed his PCP and has a new appointment scheduled with a new provider but this is not until early November. He is requesting refill of his antihypertensive medication lisinopril-HCTZ. He ran out of the medication after his dose yesterday and has been taking this medication consistently as prescribed. He has not had his dose of this medication today. Headache present to the frontal aspect of the head that is a 6 on a scale of 0-10. Denies weakness, slurred speech, dizziness, recent head injuries, blurry vision, or decreased visual acuity. History of CVA but does not use blood thinning medications at this time.    Back Pain Associated symptoms: headaches   Medication Refill Headache Associated symptoms: back pain     Past Medical History:  Diagnosis Date   Hypercholesteremia    Hypertension    Stroke [facility](HCC)    Stroke [facility](HCC) 2012     Patient Active Problem List   Diagnosis Date Noted   Tobacco use 04/16/2018   Morbid obesity (HCC) 04/16/2018   Hypertension 06/02/2015    Past Surgical History:  Procedure Laterality Date   FOOT SURGERY     KNEE SURGERY     ORIF RADIUS & ULNA FRACTURES     ORIF TIBIA & FIBULA FRACTURES     ORTHOPEDIC SURGERY     SHOULDER SURGERY         Home Medications    Prior to Admission medications   Medication Sig Start Date End Date Taking? Authorizing Provider  tiZANidine (ZANAFLEX) 4 MG tablet Take 1 tablet (4 mg total) by mouth every 6 (six) hours as needed for muscle spasms. 11/27/21  Yes Carlisle BeersStanhope, Jaron Czarnecki M, FNP  Ascorbic Acid (VITAMIN C PO) Take by mouth.    [provider]  benzonatate (TESSALON) 100 MG capsule Take 1-2 capsules (100-200 mg total) by mouth 3 (three) times daily as needed for cough. 01/04/21   Wallis BambergMani, Mario, PA-C  Blood Pressure Monitor DEVI Use as directed to check home blood pressure 2-3 times a week 07/19/18   Marcine MatarJohnson, Deborah B, MD  calcium carbonate (TUMS - DOSED IN MG ELEMENTAL CALCIUM) 500 MG chewable tablet Chew 1 tablet by mouth daily.    [provider]  cetirizine (ZYRTEC ALLERGY) 10 MG tablet Take 1 tablet (10 mg total) by mouth daily. 01/04/21   Wallis BambergMani, Mario, PA-C  diclofenac sodium (VOLTAREN) 1 % GEL Apply 2 g  topically 4 (four) times daily. Patient not taking: Reported on 12/02/2020 07/19/18   Ladell Pier, MD  HYDROcodone-acetaminophen (NORCO/VICODIN) 5-325 MG tablet Take 1-2 tablets by mouth every 6 (six) hours as needed. 02/02/21   Veryl Speak, MD  ipratropium (ATROVENT) 0.03 % nasal spray Place 2 sprays into both nostrils 2 (two) times daily. 01/04/21   Jaynee Eagles, PA-C  lidocaine (LIDODERM) 5 % Place 1 patch onto the skin daily. Remove & Discard patch within 12 hours or as directed by MD 11/20/21   Randal Buba, April, MD  lisinopril-hydrochlorothiazide (ZESTORETIC) 20-12.5 MG tablet Take 1 tablet by mouth daily. 11/27/21    Talbot Grumbling, FNP  naproxen (NAPROSYN) 500 MG tablet Take 1 tablet (500 mg total) by mouth 2 (two) times daily with a meal. 11/27/21   Talbot Grumbling, FNP  oxyCODONE-acetaminophen (PERCOCET/ROXICET) 5-325 MG tablet Take 1 tablet by mouth every 6 (six) hours as needed for severe pain. 08/15/21   Long, Wonda Olds, MD  pantoprazole (PROTONIX) 20 MG tablet Take 1 tablet (20 mg total) by mouth daily. Patient not taking: Reported on 12/02/2020 11/24/19   Carlisle Cater, PA-C  potassium chloride (KLOR-CON) 10 MEQ tablet Take 1 tablet (10 mEq total) by mouth daily. Patient not taking: Reported on 12/02/2020 11/24/19   Carlisle Cater, PA-C  predniSONE (DELTASONE) 10 MG tablet Take 2 tablets (20 mg total) by mouth 2 (two) times daily with a meal. 02/02/21   Veryl Speak, MD  promethazine (PHENERGAN) 25 MG tablet Take 1 tablet (25 mg total) by mouth every 6 (six) hours as needed for nausea or vomiting. Patient not taking: Reported on 12/02/2020 11/24/19   Carlisle Cater, PA-C  promethazine-dextromethorphan (PROMETHAZINE-DM) 6.25-15 MG/5ML syrup Take 5 mLs by mouth at bedtime as needed for cough. 01/04/21   Jaynee Eagles, PA-C  senna-docusate (SENOKOT-S) 8.6-50 MG tablet Take 1 tablet by mouth at bedtime as needed for mild constipation or moderate constipation. 08/15/21   Long, Wonda Olds, MD    Family History Family History  Problem Relation Age of Onset   Diabetes Mother    Healthy Father     Social History Social History   Tobacco Use   Smoking status: Some Days    Types: Cigars   Smokeless tobacco: Never  Vaping Use   Vaping Use: Never used  Substance Use Topics   Alcohol use: Yes    Comment: occasionally   Drug use: No     Allergies   Tea   Review of Systems Review of Systems  Musculoskeletal:  Positive for back pain.  Neurological:  Positive for headaches.  Per HPI   Physical Exam Triage Vital Signs ED Triage Vitals [11/27/21 1443]  Enc Vitals Group     BP (!)  183/117     Pulse Rate 80     Resp 19     Temp 98 F (36.7 C)     Temp Source Oral     SpO2 96 %     Weight      Height      Head Circumference      Peak Flow      Pain Score 8     Pain Loc      Pain Edu?      Excl. in [place]?    No data found.  Updated Vital Signs BP (!) 183/117 (BP Location: Right Wrist)   Pulse 80   Temp 98 F (36.7 C) (Oral)   Resp 19   SpO2 96%  Visual Acuity Right Eye Distance:   Left Eye Distance:   Bilateral Distance:    Right Eye Near:   Left Eye Near:    Bilateral Near:     Physical Exam Vitals and nursing note reviewed.  Constitutional:      Appearance: He is obese. He is not ill-appearing or toxic-appearing.  HENT:     Head: Normocephalic and atraumatic.     Right Ear: Hearing and external ear normal.     Left Ear: Hearing and external ear normal.     Nose: Nose normal.     Mouth/Throat:     Lips: Pink.  Eyes:     General: Lids are normal. Vision grossly intact. Gaze aligned appropriately.     Extraocular Movements: Extraocular movements intact.     Conjunctiva/sclera: Conjunctivae normal.  Cardiovascular:     Rate and Rhythm: Normal rate and regular rhythm.     Heart sounds: Normal heart sounds, S1 normal and S2 normal.  Pulmonary:     Effort: Pulmonary effort is normal. No respiratory distress.     Breath sounds: Normal breath sounds and air entry.  Musculoskeletal:     Cervical back: Normal and neck supple.     Thoracic back: Spasms, tenderness and bony tenderness present. No swelling, deformity, signs of trauma or lacerations. Normal range of motion. No scoliosis.     Lumbar back: Normal. Normal range of motion.  Skin:    General: Skin is warm and dry.     Capillary Refill: Capillary refill takes less than 2 seconds.     Findings: No rash.  Neurological:     General: No focal deficit present.     Mental Status: He is alert and oriented to person, place, and time. Mental status is at baseline.     Cranial Nerves: Cranial  nerves 2-12 are intact. No dysarthria or facial asymmetry.     Sensory: Sensation is intact.     Motor: Motor function is intact. No weakness, tremor or pronator drift.     Coordination: Coordination is intact. Romberg sign negative. Finger-Nose-Finger Test normal.     Gait: Gait is intact.     Comments: Non focal neurologic exam. 5/5 power throughout.  Psychiatric:        Mood and Affect: Mood normal.        Speech: Speech normal.        Behavior: Behavior normal.        Thought Content: Thought content normal.        Judgment: Judgment normal.      UC Treatments / Results  Labs (all labs ordered are listed, but only abnormal results are displayed) Labs Reviewed - No data to display  EKG   Radiology No results found.  Procedures Procedures (including critical care time)  Medications Ordered in UC Medications  ketorolac (TORADOL) 30 MG/ML injection 30 mg (30 mg Intramuscular Given 11/27/21 1540)  acetaminophen (TYLENOL) tablet 975 mg (975 mg Oral Given 11/27/21 1541)    Initial Impression / Assessment and Plan / UC Course  I have reviewed the triage vital signs and the nursing notes.  Pertinent labs & imaging results that were available during my care of the patient were reviewed by me and considered in my medical decision making (see chart for details).   1. Acute thoracic back strain Symptoms and physical exam are consistent with acute thoracic strain that will likely improve with NSAIDS, heat, gentle exercises, and as needed use of muscle relaxer medications.  Patient given 800mg  in clinic. May start using naproxen 500mg  every 12 hours as needed starting tomorrow morning with food for the next 2-3 days, then as needed. Zanaflex every 6 hours as needed for muscle spasm with drowsiness precautions. PCP follow-up if no improvement in the next 1-2 weeks.   2. Primary hypertension and medication refill No signs of organ damage or neurologic changes to physical exam despite  elevated blood pressure. No indication for immediate referral to the ED for further evaluation related to hypertension. Advised patient to pick up medication and take dose today after leaving urgent care. Advised to reduce salt intake and increase exercise. DASH diet discussed. Patient expresses agreement with plan. Strict ED return precautions given.  Discussed physical exam and available lab work findings in clinic with patient.  Counseled patient regarding appropriate use of medications and potential side effects for all medications recommended or prescribed today. Discussed red flag signs and symptoms of worsening condition,when to call the PCP office, return to urgent care, and when to seek higher level of care in the emergency department. Patient verbalizes understanding and agreement with plan. All questions answered. Patient discharged in stable condition.    Final Clinical Impressions(s) / UC Diagnoses   Final diagnoses:  Acute thoracic myofascial strain, initial encounter  Primary hypertension  Medication refill     Discharge Instructions      You have been evaluated in the today for your back pain. Your pain is most likely muscle strain which will improve on its own with time.   Take naproxen  with food every 12 hours for the next 2-3 days consistently to treat pain and inflammation, then as needed. Do not take any other NSAID containing medicine when taking naproxen like ibuprofen , Advil, Aleve, motrin, aspirin, Voltaren, diclofenac, and other medicines. Take this medication with a snack to avoid stomach upset.   Your first dose of naproxen when you get home may be tomorrow morning since you were given ketorolac injection in the clinic today.  You may also take Zanaflex 4 mg muscle relaxer every 6 hours as prescribed.  Do not take this medication and drive or drink alcohol as it can make you sleepy.  Mainly use this medicine at nighttime as needed.  Apply heat and perform  gentle range of motion exercises to the area of greatest pain to prevent muscle stiffness and provide further pain relief.   Take your blood pressure medicine every day and monitor your blood pressure at home.  Reduce the amount of salt in your diet and increase your physical activity.  Follow-up with your primary care provider or return to urgent care if your symptoms do not improve in the next 3 to 4 days with medications and interventions recommended today.  If you develop any new or worsening symptoms, please return to urgent care.  If your symptoms are severe, please go to the emergency room.  I hope you feel better!     ED Prescriptions     Medication Sig Dispense Auth. Provider   lisinopril-hydrochlorothiazide (ZESTORETIC) 20-12.5 MG tablet Take 1 tablet by mouth daily. 30 tablet , FNP   tiZANidine (ZANAFLEX) 4 MG tablet Take 1 tablet (4 mg total) by mouth every 6 (six) hours as needed for muscle spasms. 30 tablet M, FNP   naproxen (NAPROSYN) 500 MG tablet Take 1 tablet (500 mg total) by mouth 2 (two) times daily with a meal. 14 tablet Analeigh Aries, Reita May, FNP  PDMP not reviewed this encounter.   Carlisle Beers, [place] 11/27/21 2034

## 2021-11-27 NOTE — Discharge Instructions (Addendum)
You have been evaluated in the today for your back pain. Your pain is most likely muscle strain which will improve on its own with time.   Take naproxen  with food every 12 hours for the next 2-3 days consistently to treat pain and inflammation, then as needed. Do not take any other NSAID containing medicine when taking naproxen like ibuprofen , Advil, Aleve, motrin, aspirin, Voltaren, diclofenac, and other medicines. Take this medication with a snack to avoid stomach upset.   Your first dose of naproxen when you get home may be tomorrow morning since you were given ketorolac injection in the clinic today.  You may also take Zanaflex 4 mg muscle relaxer every 6 hours as prescribed.  Do not take this medication and drive or drink alcohol as it can make you sleepy.  Mainly use this medicine at nighttime as needed.  Apply heat and perform gentle range of motion exercises to the area of greatest pain to prevent muscle stiffness and provide further pain relief.   Take your blood pressure medicine every day and monitor your blood pressure at home.  Reduce the amount of salt in your diet and increase your physical activity.  Follow-up with your primary care provider or return to urgent care if your symptoms do not improve in the next 3 to 4 days with medications and interventions recommended today.  If you develop any new or worsening symptoms, please return to urgent care.  If your symptoms are severe, please go to the emergency room.  I hope you feel better!

## 2021-11-27 NOTE — ED Triage Notes (Signed)
Pt reports mid back pain after bending down this morning to pick something up. States when getting up her felt a sharp pain.  Also reports a headache and needs a medication refill for Lisinopril HTZ. Reports running out of medication today.

## 2022-02-03 ENCOUNTER — Emergency Department (HOSPITAL_BASED_OUTPATIENT_CLINIC_OR_DEPARTMENT_OTHER)
Admission: EM | Admit: 2022-02-03 | Discharge: 2022-02-03 | Disposition: A | Discharge status: Home / Self Care | Payer: Commercial Managed Care - HMO | Attending: Emergency Medicine | Admitting: Emergency Medicine

## 2022-02-03 ENCOUNTER — Emergency Department (HOSPITAL_BASED_OUTPATIENT_CLINIC_OR_DEPARTMENT_OTHER): Payer: Commercial Managed Care - HMO

## 2022-02-03 ENCOUNTER — Other Ambulatory Visit: Payer: Self-pay

## 2022-02-03 DIAGNOSIS — R079 Chest pain, unspecified: Secondary | ICD-10-CM

## 2022-02-03 DIAGNOSIS — I10 Essential (primary) hypertension: Secondary | ICD-10-CM

## 2022-02-03 DIAGNOSIS — R0789 Other chest pain: Secondary | ICD-10-CM | POA: Diagnosis present

## 2022-02-03 DIAGNOSIS — F17290 Nicotine dependence, other tobacco product, uncomplicated: Secondary | ICD-10-CM | POA: Diagnosis not present

## 2022-02-03 DIAGNOSIS — Z79899 Other long term (current) drug therapy: Secondary | ICD-10-CM | POA: Insufficient documentation

## 2022-02-03 DIAGNOSIS — E876 Hypokalemia: Secondary | ICD-10-CM | POA: Insufficient documentation

## 2022-02-03 LAB — BASIC METABOLIC PANEL
Anion gap: 6 (ref 5–15)
BUN: 9 mg/dL (ref 6–20)
CO2: 28 mmol/L (ref 22–32)
Calcium: 9.2 mg/dL (ref 8.9–10.3)
Chloride: 103 mmol/L (ref 98–111)
Creatinine, Ser: 1.02 mg/dL (ref 0.61–1.24)
GFR, Estimated: >60 mL/min (ref >60)
Glucose, Bld: 121 mg/dL — ABNORMAL HIGH (ref 70–99)
Potassium: 3.2 mmol/L — ABNORMAL LOW (ref 3.5–5.1)
Sodium: 137 mmol/L (ref 135–145)

## 2022-02-03 LAB — CBC
HCT: 47.7 % (ref 39.0–52.0)
Hemoglobin: 16.2 g/dL (ref 13.0–17.0)
MCH: 29.2 pg (ref 26.0–34.0)
MCHC: 34 g/dL (ref 30.0–36.0)
MCV: 85.9 fL (ref 80.0–100.0)
Platelets: 235 10*3/uL (ref 150–400)
RBC: 5.55 MIL/uL (ref 4.22–5.81)
RDW: 13.2 % (ref 11.5–15.5)
WBC: 9.3 10*3/uL (ref 4.0–10.5)
nRBC: 0 % (ref 0.0–0.2)

## 2022-02-03 LAB — TROPONIN I (HIGH SENSITIVITY)
Troponin I (High Sensitivity): 7 ng/L (ref <18)
Troponin I (High Sensitivity): 9 ng/L (ref <18)

## 2022-02-03 MED ORDER — ACETAMINOPHEN 500 MG PO TABS
1000.0000 mg | ORAL_TABLET | Freq: Once | ORAL | Status: AC
  Administered 2022-02-03: 1000 mg via ORAL
  Filled 2022-02-03: qty 2

## 2022-02-03 MED ORDER — LISINOPRIL-HYDROCHLOROTHIAZIDE 20-12.5 MG PO TABS: 1.0000 | ORAL_TABLET | Freq: Every day | ORAL | 1 refills | Status: DC

## 2022-02-03 NOTE — Discharge Instructions (Signed)
You have been evaluated for your chest pain.  Fortunately your blood work today did not show any concerning finding.  Chest pain may be related to elevated blood pressure.  Please take blood pressure medication as prescribed and follow-up closely with your primary care doctor for further care.  Discuss your chest pain with your doctor as you may benefit from an outpatient heart stress test if indicated.  Return to the ER if you have any concern.

## 2022-02-03 NOTE — ED Provider Notes (Signed)
[facility] EMERGENCY DEPARTMENT Provider Note   CSN: [id number] Arrival date & time: 02/03/22  1621     History {Add pertinent medical, surgical, social history, OB history to HPI:1} Chief Complaint  Patient presents with   Chest Pain    [name] [name] is a 41 y.o. male.  The history is provided by the patient and medical records. No language interpreter was used.  Chest Pain    41 year old male with significant history of hypertension, hypercholesterolemia, obesity, prior stroke who presenting with complaints of chest pain.  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  He described pain as a sharp sensation to his left chest, felt his left arm went numb for moment follows with throbbing frontal headache.  He endorsed feeling a bit lightheadedness, breakdown to sweat, and felt nauseous without any shortness of breath.  He took several BC powders which seems to resolve his chest pain but still endorses a throbbing headache.  Does not endorse any fever or chills no runny nose sneezing coughing sore throat vomiting diarrhea trouble urinating or rash.  He does smoke cigar.  Denies any cardiac history.  Report remote history of stroke 13 years ago but unable to tell me anything more than that.   Home Medications Prior to Admission medications   Medication Sig Start Date End Date Taking? Authorizing Provider  Ascorbic Acid (VITAMIN C PO) Take by mouth.    [provider]  benzonatate (TESSALON) 100 MG capsule Take 1-2 capsules (100-200 mg total) by mouth 3 (three) times daily as needed for cough. 01/04/21   Wallis Bamberg, PA-C  Blood Pressure Monitor DEVI Use as directed to check home blood pressure 2-3 times a week 07/19/18   Marcine Matar, MD  calcium carbonate (TUMS - DOSED IN MG ELEMENTAL CALCIUM) 500 MG chewable tablet Chew 1 tablet by mouth daily.    [provider]   cetirizine (ZYRTEC ALLERGY) 10 MG tablet Take 1 tablet (10 mg total) by mouth daily. 01/04/21   Wallis Bamberg, PA-C  diclofenac sodium (VOLTAREN) 1 % GEL Apply 2 g topically 4 (four) times daily. Patient not taking: Reported on 12/02/2020 07/19/18   Marcine Matar, MD  HYDROcodone-acetaminophen (NORCO/VICODIN) 5-325 MG tablet Take 1-2 tablets by mouth every 6 (six) hours as needed. 02/02/21   Geoffery Lyons, MD  ipratropium (ATROVENT) 0.03 % nasal spray Place 2 sprays into both nostrils 2 (two) times daily. 01/04/21   Wallis Bamberg, PA-C  lidocaine (LIDODERM) 5 % Place 1 patch onto the skin daily. Remove & Discard patch within 12 hours or as directed by MD 11/20/21   Nicanor Alcon, April, MD  lisinopril-hydrochlorothiazide (ZESTORETIC) 20-12.5 MG tablet Take 1 tablet by mouth daily. 11/27/21   Carlisle Beers, FNP  naproxen (NAPROSYN) 500 MG tablet Take 1 tablet (500 mg total) by mouth 2 (two) times daily with a meal. 11/27/21   Carlisle Beers, FNP  oxyCODONE-acetaminophen (PERCOCET/ROXICET) 5-325 MG tablet Take 1 tablet by mouth every 6 (six) hours as needed for severe pain. 08/15/21   Long, Arlyss Repress, MD  pantoprazole (PROTONIX) 20 MG tablet Take 1 tablet (20 mg total) by mouth daily. Patient not taking: Reported on 12/02/2020 11/24/19   Renne Crigler, PA-C  potassium chloride (KLOR-CON) 10 MEQ tablet Take 1 tablet (10 mEq total) by mouth daily. Patient not taking: Reported on 12/02/2020 11/24/19   Renne Crigler, PA-C  predniSONE (DELTASONE) 10 MG tablet Take 2 tablets (20 mg total) by mouth 2 (two) times daily with a meal. 02/02/21   Geoffery Lyons, MD  promethazine (PHENERGAN) 25 MG tablet Take 1 tablet (25 mg total) by mouth every 6 (six) hours as needed for nausea or vomiting. Patient not taking: Reported on 12/02/2020 11/24/19   Renne Crigler, PA-C  promethazine-dextromethorphan (PROMETHAZINE-DM) 6.25-15 MG/5ML syrup Take 5 mLs by mouth at bedtime as needed for cough. 01/04/21   Wallis Bamberg, PA-C  senna-docusate (SENOKOT-S) 8.6-50 MG tablet Take 1 tablet by mouth at bedtime as needed for mild constipation or moderate constipation. 08/15/21   Long, Arlyss Repress, MD  tiZANidine (ZANAFLEX) 4 MG tablet Take 1 tablet (4 mg total) by mouth every 6 (six) hours as needed for muscle spasms. 11/27/21   Carlisle Beers, FNP      Allergies    Tea    Review of Systems   Review of Systems  Cardiovascular:  Positive for chest pain.  All other systems reviewed and are negative.   Physical Exam Updated Vital Signs BP (!) 157/103 (BP Location: Left Wrist)   Pulse 81   Temp 98.8 F (37.1 C) (Oral)   Resp 20   Ht 6' 4.5" (1.943 m)   Wt (!) 181.4 kg   SpO2 96%   BMI 48.06 kg/m  Physical Exam Vitals and nursing note reviewed.  Constitutional:      General: He is not in acute distress.    Appearance: He is well-developed. He is obese.  HENT:     Head: Atraumatic.  Eyes:     Conjunctiva/sclera: Conjunctivae normal.  Cardiovascular:     Rate and Rhythm: Normal rate and regular rhythm.     Pulses: Normal pulses.     Heart sounds: Normal heart sounds.  Pulmonary:     Effort: Pulmonary effort is normal.     Breath sounds: Normal breath sounds. No wheezing, rhonchi or rales.  Abdominal:     Palpations: Abdomen is soft.     Tenderness: There is no abdominal tenderness.  Musculoskeletal:     Cervical back: Neck supple.  Skin:    Findings: No rash.  Neurological:     Mental Status: He is alert and oriented to person, place, and time.     GCS: GCS eye subscore is 4. GCS verbal subscore is 5. GCS motor subscore is 6.     Cranial Nerves: Cranial nerves 2-12 are intact.     Sensory: Sensation is intact.     Motor: Motor function is intact.     Coordination: Coordination is intact.     ED Results / Procedures / Treatments   Labs (all labs ordered are listed, but only abnormal results are displayed) Labs Reviewed  BASIC METABOLIC PANEL - Abnormal; Notable for the  following components:      Result Value   Potassium 3.2 (*)    Glucose, Bld 121 (*)    All other components within normal limits  CBC  TROPONIN I (HIGH SENSITIVITY)  TROPONIN I (HIGH SENSITIVITY)    EKG None ED ECG REPORT   Date: 02/03/2022  Rate: 78  Rhythm: normal sinus rhythm  QRS Axis: normal  Intervals: normal  ST/T Wave abnormalities: nonspecific T wave changes  Conduction Disutrbances:none  Narrative Interpretation:   Old EKG Reviewed: unchanged  I have personally reviewed the EKG tracing and agree with the computerized printout as noted.   Radiology DG Chest 2 View  Result Date: 02/03/2022  CLINICAL DATA:  Sudden onset sharp chest pain, hypertension EXAM: CHEST - 2 VIEW COMPARISON:  06/14/2021 FINDINGS: Frontal and lateral views of the chest demonstrate an unremarkable cardiac silhouette. No acute airspace disease, effusion, or pneumothorax. No acute bony abnormalities. IMPRESSION: 1. No acute intrathoracic process. Electronically Signed   By: Sharlet Salina M.D.   On: 02/03/2022 17:54    Procedures Procedures  {Document cardiac monitor, telemetry assessment procedure when appropriate:1}  Medications Ordered in ED Medications  acetaminophen (TYLENOL) tablet 1,000 mg (1,000 mg Oral Given 02/03/22 1732)    ED Course/ Medical Decision Making/ A&P                           Medical Decision Making Amount and/or Complexity of Data Reviewed Labs: ordered. Radiology: ordered.  Risk OTC drugs.   BP (!) 157/103 (BP Location: Left Wrist)   Pulse 81   Temp 98.8 F (37.1 C) (Oral)   Resp 20   Ht 6' 4.5" (1.943 m)   Wt (!) 181.4 kg   SpO2 96%   BMI 48.06 kg/m   50:46 PM 41 year old male with significant history of hypertension, hypercholesterolemia, obesity, prior stroke who presenting with complaints of chest pain.  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  He described  pain as a sharp sensation to his left chest, felt his left arm went numb for moment follows with throbbing frontal headache.  He endorsed feeling a bit lightheadedness, breakdown to sweat, and felt nauseous without any shortness of breath.  He took several BC powders which seems to resolve his chest pain but still endorses a throbbing headache.  Does not endorse any fever or chills no runny nose sneezing coughing sore throat vomiting diarrhea trouble urinating or rash.  He does smoke cigar.  Denies any cardiac history.  Report remote history of stroke 13 years ago but unable to tell me anything more than that.  On exam, this is an obese male laying bed appears to be in no acute discomfort.  He does not have any focal neurodeficit exam.  He has equal strength throughout all 4 extremities with intact distal pulses.  He is mentating appropriately.  Does not have any reproducible chest pain.  Lungs are clear to auscultation bilaterally abdomen soft nontender.  He does endorse headache however he does not have any nuchal rigidity or any concerning finding on my initial exam.  His symptoms started approximately 2 hours ago and he did report a remote history of stroke therefore I will obtain a head CT scan and to rule out intracranial hemorrhage although my suspicion is low.  Cardiac workup initiated.  Tylenol given for headache.  5:28 PM I have ordered a head CT scan to rule out ICH however patient declined CT stating that he has had headaches like this in the past and he is unconcerned about potential stroke.  Patient does not have any focal neurodeficit on my initial exam.  He understand the risk of not having the CT scan but at this time I felt he is able to make informed decision and has acknowledged risk and benefit of the events imaging.  CT scan have been canceled.  -Labs ordered, independently viewed and interpreted by me.  Labs remarkable for *** -The patient was maintained on a cardiac monitor.  I  personally viewed and interpreted the cardiac monitored  which showed an underlying rhythm of: *** -Imaging independently viewed and interpreted by me and I agree with radiologist's interpretation.  Result remarkable for *** -This patient presents to the ED for concern of ***, this involves an extensive number of treatment options, and is a complaint that carries with it a high risk of complications and morbidity.  The differential diagnosis includes *** -Co morbidities that complicate the patient evaluation includes *** -Treatment includes *** -Reevaluation of the patient after these medicines showed that the patient {resolved/improved/worsened:23923::"improved"} -PCP office notes or outside notes reviewed -Discussion with specialist *** -Escalation to admission/observation considered: patients feels much better, is comfortable with discharge, and will follow up with PCP -Prescription medication considered, patient comfortable with *** -Social Determinant of Health considered which includes ***   {Document critical care time when appropriate:1} {Document review of labs and clinical decision tools ie heart score, Chads2Vasc2 etc:1}  {Document your independent review of radiology images, and any outside records:1} {Document your discussion with family members, caretakers, and with consultants:1} {Document social determinants of health affecting pt's care:1} {Document your decision making why or why not admission, treatments were needed:1} Final Clinical Impression(s) / ED Diagnoses Final diagnoses:  None    Rx / DC Orders ED Discharge Orders     None

## 2022-02-03 NOTE — ED Triage Notes (Addendum)
Pt presents with sudden onset of CP and elevated B/P while standing at work. Pt describes the pain as sharp and reports some ShOB and numbness that went down his L arm. Symptoms resolved after taking BC powder. Pt now has a HA.

## 2022-02-03 NOTE — ED Notes (Signed)
Pt refused CT. EDP notified

## 2022-02-03 NOTE — ED Notes (Signed)
Provider aware of BP. Pt prescribed BP medications.

## 2022-02-07 ENCOUNTER — Emergency Department (HOSPITAL_BASED_OUTPATIENT_CLINIC_OR_DEPARTMENT_OTHER)
Admission: EM | Admit: 2022-02-07 | Discharge: 2022-02-07 | Disposition: A | Discharge status: Home / Self Care | Payer: Commercial Managed Care - HMO | Attending: Emergency Medicine | Admitting: Emergency Medicine

## 2022-02-07 ENCOUNTER — Emergency Department (HOSPITAL_BASED_OUTPATIENT_CLINIC_OR_DEPARTMENT_OTHER): Payer: Commercial Managed Care - HMO

## 2022-02-07 ENCOUNTER — Encounter (HOSPITAL_BASED_OUTPATIENT_CLINIC_OR_DEPARTMENT_OTHER): Payer: Self-pay

## 2022-02-07 ENCOUNTER — Other Ambulatory Visit: Payer: Self-pay

## 2022-02-07 DIAGNOSIS — I10 Essential (primary) hypertension: Secondary | ICD-10-CM

## 2022-02-07 DIAGNOSIS — J101 Influenza due to other identified influenza virus with other respiratory manifestations: Secondary | ICD-10-CM

## 2022-02-07 DIAGNOSIS — Z20822 Contact with and (suspected) exposure to covid-19: Secondary | ICD-10-CM | POA: Insufficient documentation

## 2022-02-07 DIAGNOSIS — E876 Hypokalemia: Secondary | ICD-10-CM | POA: Insufficient documentation

## 2022-02-07 DIAGNOSIS — J189 Pneumonia, unspecified organism: Secondary | ICD-10-CM | POA: Diagnosis not present

## 2022-02-07 DIAGNOSIS — R0602 Shortness of breath: Secondary | ICD-10-CM | POA: Diagnosis present

## 2022-02-07 DIAGNOSIS — Z79899 Other long term (current) drug therapy: Secondary | ICD-10-CM | POA: Diagnosis not present

## 2022-02-07 LAB — COMPREHENSIVE METABOLIC PANEL
ALT: 100 U/L — ABNORMAL HIGH (ref 0–44)
AST: 112 U/L — ABNORMAL HIGH (ref 15–41)
Albumin: 3.7 g/dL (ref 3.5–5.0)
Alkaline Phosphatase: 78 U/L (ref 38–126)
Anion gap: 9 (ref 5–15)
BUN: 7 mg/dL (ref 6–20)
CO2: 27 mmol/L (ref 22–32)
Calcium: 8.6 mg/dL — ABNORMAL LOW (ref 8.9–10.3)
Chloride: 96 mmol/L — ABNORMAL LOW (ref 98–111)
Creatinine, Ser: 1.14 mg/dL (ref 0.61–1.24)
GFR, Estimated: >60 mL/min (ref >60)
Glucose, Bld: 116 mg/dL — ABNORMAL HIGH (ref 70–99)
Potassium: 3.1 mmol/L — ABNORMAL LOW (ref 3.5–5.1)
Sodium: 132 mmol/L — ABNORMAL LOW (ref 135–145)
Total Bilirubin: 0.7 mg/dL (ref 0.3–1.2)
Total Protein: 7.5 g/dL (ref 6.5–8.1)

## 2022-02-07 LAB — CBC WITH DIFFERENTIAL/PLATELET
Abs Immature Granulocytes: 0.02 10*3/uL (ref 0.00–0.07)
Basophils Absolute: 0 10*3/uL (ref 0.0–0.1)
Basophils Relative: 1 %
Eosinophils Absolute: 0 10*3/uL (ref 0.0–0.5)
Eosinophils Relative: 0 %
HCT: 47.8 % (ref 39.0–52.0)
Hemoglobin: 16.2 g/dL (ref 13.0–17.0)
Immature Granulocytes: 0 %
Lymphocytes Relative: 26 %
Lymphs Abs: 1.7 10*3/uL (ref 0.7–4.0)
MCH: 28.9 pg (ref 26.0–34.0)
MCHC: 33.9 g/dL (ref 30.0–36.0)
MCV: 85.4 fL (ref 80.0–100.0)
Monocytes Absolute: 1.3 10*3/uL — ABNORMAL HIGH (ref 0.1–1.0)
Monocytes Relative: 19 %
Neutro Abs: 3.6 10*3/uL (ref 1.7–7.7)
Neutrophils Relative %: 54 %
Platelets: 194 10*3/uL (ref 150–400)
RBC: 5.6 MIL/uL (ref 4.22–5.81)
RDW: 13.2 % (ref 11.5–15.5)
WBC: 6.7 10*3/uL (ref 4.0–10.5)
nRBC: 0 % (ref 0.0–0.2)

## 2022-02-07 LAB — RESP PANEL BY RT-PCR (RSV, FLU A&B, COVID)  RVPGX2
Influenza A by PCR: NEGATIVE
Influenza B by PCR: POSITIVE — AB
Resp Syncytial Virus by PCR: NEGATIVE
SARS Coronavirus 2 by RT PCR: NEGATIVE

## 2022-02-07 MED ORDER — ACETAMINOPHEN 500 MG PO TABS
1000.0000 mg | ORAL_TABLET | Freq: Once | ORAL | Status: AC
  Administered 2022-02-07: 1000 mg via ORAL
  Filled 2022-02-07: qty 2

## 2022-02-07 MED ORDER — AMOXICILLIN-POT CLAVULANATE 875-125 MG PO TABS: 1.0000 | ORAL_TABLET | Freq: Two times a day (BID) | ORAL | 0 refills | Status: AC

## 2022-02-07 MED ORDER — KETOROLAC TROMETHAMINE 15 MG/ML IJ SOLN
15.0000 mg | Freq: Once | INTRAMUSCULAR | Status: AC
  Administered 2022-02-07: 15 mg via INTRAVENOUS
  Filled 2022-02-07: qty 1

## 2022-02-07 MED ORDER — ALBUTEROL SULFATE HFA 108 (90 BASE) MCG/ACT IN AERS: 2.0000 | INHALATION_SPRAY | RESPIRATORY_TRACT | Status: DC | PRN

## 2022-02-07 MED ORDER — AMOXICILLIN-POT CLAVULANATE 875-125 MG PO TABS
1.0000 | ORAL_TABLET | Freq: Once | ORAL | Status: AC
  Administered 2022-02-07: 1 via ORAL
  Filled 2022-02-07: qty 1

## 2022-02-07 MED ORDER — POTASSIUM CHLORIDE CRYS ER 20 MEQ PO TBCR
40.0000 meq | EXTENDED_RELEASE_TABLET | Freq: Once | ORAL | Status: AC
  Administered 2022-02-07: 40 meq via ORAL
  Filled 2022-02-07: qty 2

## 2022-02-07 MED ORDER — LACTATED RINGERS IV BOLUS
1000.0000 mL | Freq: Once | INTRAVENOUS | Status: AC
  Administered 2022-02-07: 1000 mL via INTRAVENOUS

## 2022-02-07 MED ORDER — ONDANSETRON 4 MG PO TBDP: 4.0000 mg | ORAL_TABLET | Freq: Three times a day (TID) | ORAL | 0 refills | Status: DC | PRN

## 2022-02-07 NOTE — ED Triage Notes (Signed)
Pt with fever, cough, vomiting, and dyspnea for several days. Pt lightheaded and dizzy. Pt taking OTC medicine at home with no relief. Reports he has asthma, butit has not been bad in a long time. Pt reports "I can't breathe".

## 2022-02-07 NOTE — Discharge Instructions (Addendum)
Your flu test today is positive.  This is probably causing your symptoms today.  However there is also a small amount of pneumonia on your chest x-ray so you are being prescribed antibiotics.  Your blood pressure is very elevated today which is a recurrent problem for you.  It is very important to take your blood pressure meds as prescribed to help prevent long-term and short-term problems such as heart failure or heart attack, stroke, kidney failure, etc.  If you develop high fever, trouble breathing, coughing up blood, chest pain, or any other new/concerning symptoms then return to the ER for evaluation.

## 2022-02-07 NOTE — ED Provider Notes (Signed)
[facility] EM[facility]GENCY DEPARTMENT Provider Note   CSN: [id number] Arrival date & time: 02/07/22  2034     History  Chief Complaint  Patient presents with   Shortness of Breath    [name] [name] is a 41 y.o. male.  HPI 41 year old male with a history of hypertension presents with chest pain.  He also has multiple other complaints which include cough, fever (103 today), shortness of breath, lightheadedness and weakness, headache.  The symptoms all originally started on 12/22.  The chest pain is sharp and constant.  His abdomen feels sore from throwing up today and has had vomiting and diarrhea.  However he is not currently nauseated.  Home Medications Prior to Admission medications   Medication Sig Start Date End Date Taking? Authorizing Provider  amoxicillin-clavulanate (AUGMENTIN) 875-125 MG tablet Take 1 tablet by mouth every 12 (twelve) hours for 5 days. 02/07/22 02/12/22 Yes Sherwood Gambler, MD  ondansetron (ZOFRAN-ODT) 4 MG disintegrating tablet Take 1 tablet (4 mg total) by mouth every 8 (eight) hours as needed for nausea or vomiting. 02/07/22  Yes Sherwood Gambler, MD  Ascorbic Acid (VITAMIN C PO) Take by mouth.    [provider]  benzonatate (TESSALON) 100 MG capsule Take 1-2 capsules (100-200 mg total) by mouth 3 (three) times daily as needed for cough. 01/04/21   Jaynee Eagles, PA-C  Blood Pressure Monitor DEVI Use as directed to check home blood pressure 2-3 times a week 07/19/18   Ladell Pier, MD  calcium carbonate (TUMS - DOSED IN MG ELEMENTAL CALCIUM) 500 MG chewable tablet Chew 1 tablet by mouth daily.    [provider]  cetirizine (ZYRTEC ALLERGY) 10 MG tablet Take 1 tablet (10 mg total) by mouth daily. 01/04/21   Jaynee Eagles, PA-C  diclofenac sodium (VOLTAREN) 1 % GEL Apply 2 g topically 4 (four) times daily. Patient not taking: Reported on 12/02/2020 07/19/18   Ladell Pier, MD  HYDROcodone-acetaminophen (NORCO/VICODIN) 5-325 MG  tablet Take 1-2 tablets by mouth every 6 (six) hours as needed. 02/02/21   Veryl Speak, MD  ipratropium (ATROVENT) 0.03 % nasal spray Place 2 sprays into both nostrils 2 (two) times daily. 01/04/21   Jaynee Eagles, PA-C  lidocaine (LIDODERM) 5 % Place 1 patch onto the skin daily. Remove & Discard patch within 12 hours or as directed by MD 11/20/21   Randal Buba, April, MD  lisinopril-hydrochlorothiazide (ZESTORETIC) 20-12.5 MG tablet Take 1 tablet by mouth daily. 02/03/22   Domenic Moras, PA-C  naproxen (NAPROSYN) 500 MG tablet Take 1 tablet (500 mg total) by mouth 2 (two) times daily with a meal. 11/27/21   Talbot Grumbling, FNP  oxyCODONE-acetaminophen (PERCOCET/ROXICET) 5-325 MG tablet Take 1 tablet by mouth every 6 (six) hours as needed for severe pain. 08/15/21   Long, Wonda Olds, MD  pantoprazole (PROTONIX) 20 MG tablet Take 1 tablet (20 mg total) by mouth daily. Patient not taking: Reported on 12/02/2020 11/24/19   Carlisle Cater, PA-C  potassium chloride (KLOR-CON) 10 MEQ tablet Take 1 tablet (10 mEq total) by mouth daily. Patient not taking: Reported on 12/02/2020 11/24/19   Carlisle Cater, PA-C  predniSONE (DELTASONE) 10 MG tablet Take 2 tablets (20 mg total) by mouth 2 (two) times daily with a meal. 02/02/21   Veryl Speak, MD  promethazine (PHENERGAN) 25 MG tablet Take 1 tablet (25 mg total) by mouth every 6 (six) hours as needed for nausea or vomiting. Patient not taking: Reported on 12/02/2020 11/24/19   Carlisle Cater,  PA-C  promethazine-dextromethorphan (PROMETHAZINE-DM) 6.25-15 MG/5ML syrup Take 5 mLs by mouth at bedtime as needed for cough. 01/04/21   Jaynee Eagles, PA-C  senna-docusate (SENOKOT-S) 8.6-50 MG tablet Take 1 tablet by mouth at bedtime as needed for mild constipation or moderate constipation. 08/15/21   Long, Wonda Olds, MD  tiZANidine (ZANAFLEX) 4 MG tablet Take 1 tablet (4 mg total) by mouth every 6 (six) hours as needed for muscle spasms. 11/27/21   Talbot Grumbling, FNP       Allergies    Tea    Review of Systems   Review of Systems  Constitutional:  Positive for fever.  Respiratory:  Positive for cough and shortness of breath.   Cardiovascular:  Positive for chest pain.  Gastrointestinal:  Positive for diarrhea and vomiting.    Physical Exam Updated Vital Signs BP (!) 175/115 (BP Location: Right Arm)   Pulse 85   Temp 98.5 F (36.9 C) (Oral)   Resp (!) 32   Ht 6\' 4"  (1.93 m)   Wt (!) 172.4 kg   SpO2 95%   BMI 46.26 kg/m  Physical Exam Vitals and nursing note reviewed.  Constitutional:      Appearance: He is well-developed. He is obese.  HENT:     Head: Normocephalic and atraumatic.  Cardiovascular:     Rate and Rhythm: Normal rate and regular rhythm.     Heart sounds: Normal heart sounds.  Pulmonary:     Effort: Pulmonary effort is normal.     Comments: Difficult respiratory exam given obesity.  I do not hear any obvious wheezing.  No significant increased work of breathing on my exam. Abdominal:     Palpations: Abdomen is soft.     Tenderness: There is no abdominal tenderness.  Skin:    General: Skin is warm and dry.  Neurological:     Mental Status: He is alert.     ED Results / Procedures / Treatments   Labs (all labs ordered are listed, but only abnormal results are displayed) Labs Reviewed  RESP PANEL BY RT-PCR (RSV, FLU A&B, COVID)  RVPGX2 - Abnormal; Notable for the following components:      Result Value   Influenza B by PCR POSITIVE (*)    All other components within normal limits  COMPREHENSIVE METABOLIC PANEL - Abnormal; Notable for the following components:   Sodium 132 (*)    Potassium 3.1 (*)    Chloride 96 (*)    Glucose, Bld 116 (*)    Calcium 8.6 (*)    AST 112 (*)    ALT 100 (*)    All other components within normal limits  CBC WITH DIFFERENTIAL/PLATELET - Abnormal; Notable for the following components:   Monocytes Absolute 1.3 (*)    All other components within normal limits    EKG EKG  Interpretation  Date/Time:  Tuesday February 07 2022 20:58:24 EST Ventricular Rate:  81 PR Interval:  138 QRS Duration: 81 QT Interval:  347 QTC Calculation: 403 R Axis:   71 Text Interpretation: Sinus rhythm Borderline T abnormalities, inferior leads no significant change since Feb 03 2022 Confirmed by Sherwood Gambler [phone]) on 02/07/2022 9:07:17 PM  Radiology DG Chest 2 View  Result Date: 02/07/2022 CLINICAL DATA:  Fever and cough, dyspnea for several days EXAM: CHEST - 2 VIEW COMPARISON:  02/03/2022 FINDINGS: Frontal and lateral views of the chest are obtained. Evaluation is limited by AP positioning and body habitus. Cardiac silhouette is stable. On the lateral view  there is likely right middle lobe airspace disease identified, less well appreciated on the frontal projection. No effusion or pneumothorax. No acute bony abnormalities. IMPRESSION: 1. Evaluation limited by technique and body habitus. 2. Suspected right middle lobe bronchopneumonia, best seen on the lateral view. This is new since the previous exam. Electronically Signed   By: Sharlet Salina M.D.   On: 02/07/2022 21:15    Procedures Procedures    Medications Ordered in ED Medications  albuterol (VENTOLIN HFA) 108 (90 Base) MCG/ACT inhaler 2 puff (has no administration in time range)  acetaminophen (TYLENOL) tablet 1,000 mg (1,000 mg Oral Given 02/07/22 2135)  lactated ringers bolus 1,000 mL (0 mLs Intravenous Stopped 02/07/22 2249)  ketorolac (TORADOL) 15 MG/ML injection 15 mg (15 mg Intravenous Given 02/07/22 2139)  potassium chloride SA (KLOR-CON M) CR tablet 40 mEq (40 mEq Oral Given 02/07/22 2214)  amoxicillin-clavulanate (AUGMENTIN) 875-125 MG per tablet 1 tablet (1 tablet Oral Given 02/07/22 2254)    ED Course/ Medical Decision Making/ A&P                           Medical Decision Making Amount and/or Complexity of Data Reviewed Labs: ordered.    Details: Mild hypokalemia.  Flu test is positive.  WBC  normal. Radiology: ordered and independent interpretation performed.    Details: Questionable mild pneumonia ECG/medicine tests: independent interpretation performed.    Details: Nonspecific but unchanged T waves  Risk OTC drugs. Prescription drug management.   Patient symptoms are probably all coming from the flu.  He has chronic uncontrolled hypertension that is similar to baseline values on chart review.  Otherwise he is not hypoxic or in distress.  He was given Tylenol, Toradol, fluids and potassium.  Benign abdominal exam.  Given the questionable pneumonia, will treat with a short course of antibiotics but this is probably all from the flu.  Otherwise, he does not appear to need IV antibiotics or admission and is stable for outpatient treatment and follow-up with a PCP.  Will give return precautions.  Per chart review he was just prescribed his antihypertensives a few days ago.  He was encouraged to take these as instructed.        Final Clinical Impression(s) / ED Diagnoses Final diagnoses:  Influenza B  Community acquired pneumonia of right middle lobe of lung  Uncontrolled hypertension    Rx / DC Orders ED Discharge Orders          Ordered    amoxicillin-clavulanate (AUGMENTIN) 875-125 MG tablet  Every 12 hours        02/07/22 2243    ondansetron (ZOFRAN-ODT) 4 MG disintegrating tablet  Every 8 hours PRN        02/07/22 2243              Pricilla Loveless, MD 02/07/22 2306

## 2022-02-15 ENCOUNTER — Ambulatory Visit (INDEPENDENT_AMBULATORY_CARE_PROVIDER_SITE_OTHER): Payer: Commercial Managed Care - HMO | Admitting: Family Medicine

## 2022-02-15 ENCOUNTER — Other Ambulatory Visit: Payer: Self-pay | Admitting: Family Medicine

## 2022-02-15 ENCOUNTER — Encounter: Payer: Self-pay | Admitting: Family Medicine

## 2022-02-15 VITALS — BP 150/110 | HR 81 | Temp 97.9°F | Resp 16 | Ht 74.5 in | Wt >= 6400 oz

## 2022-02-15 DIAGNOSIS — I10 Essential (primary) hypertension: Secondary | ICD-10-CM

## 2022-02-15 DIAGNOSIS — M25561 Pain in right knee: Secondary | ICD-10-CM

## 2022-02-15 DIAGNOSIS — G8929 Other chronic pain: Secondary | ICD-10-CM

## 2022-02-15 DIAGNOSIS — M549 Dorsalgia, unspecified: Secondary | ICD-10-CM | POA: Diagnosis not present

## 2022-02-15 DIAGNOSIS — E1165 Type 2 diabetes mellitus with hyperglycemia: Secondary | ICD-10-CM

## 2022-02-15 DIAGNOSIS — M25562 Pain in left knee: Secondary | ICD-10-CM

## 2022-02-15 DIAGNOSIS — R748 Abnormal levels of other serum enzymes: Secondary | ICD-10-CM

## 2022-02-15 LAB — CBC WITH DIFFERENTIAL/PLATELET
Basophils Absolute: 0 10*3/uL (ref 0.0–0.1)
Basophils Relative: 0.4 % (ref 0.0–3.0)
Eosinophils Absolute: 0.2 10*3/uL (ref 0.0–0.7)
Eosinophils Relative: 2.4 % (ref 0.0–5.0)
HCT: 49.6 % (ref 39.0–52.0)
Hemoglobin: 16.7 g/dL (ref 13.0–17.0)
Lymphocytes Relative: 35.8 % (ref 12.0–46.0)
Lymphs Abs: 2.8 10*3/uL (ref 0.7–4.0)
MCHC: 33.7 g/dL (ref 30.0–36.0)
MCV: 86.8 fl (ref 78.0–100.0)
Monocytes Absolute: 0.7 10*3/uL (ref 0.1–1.0)
Monocytes Relative: 9.6 % (ref 3.0–12.0)
Neutro Abs: 4 10*3/uL (ref 1.4–7.7)
Neutrophils Relative %: 51.8 % (ref 43.0–77.0)
Platelets: 226 10*3/uL (ref 150.0–400.0)
RBC: 5.72 Mil/uL (ref 4.22–5.81)
RDW: 14 % (ref 11.5–15.5)
WBC: 7.7 10*3/uL (ref 4.0–10.5)

## 2022-02-15 LAB — COMPREHENSIVE METABOLIC PANEL
ALT: 125 U/L — ABNORMAL HIGH (ref 0–53)
AST: 120 U/L — ABNORMAL HIGH (ref 0–37)
Albumin: 4.2 g/dL (ref 3.5–5.2)
Alkaline Phosphatase: 78 U/L (ref 39–117)
BUN: 9 mg/dL (ref 6–23)
CO2: 29 mEq/L (ref 19–32)
Calcium: 9.8 mg/dL (ref 8.4–10.5)
Chloride: 99 mEq/L (ref 96–112)
Creatinine, Ser: 1.05 mg/dL (ref 0.40–1.50)
GFR: 87.12 mL/min (ref >60.00)
Glucose, Bld: 133 mg/dL — ABNORMAL HIGH (ref 70–99)
Potassium: 3.7 mEq/L (ref 3.5–5.1)
Sodium: 139 mEq/L (ref 135–145)
Total Bilirubin: 0.7 mg/dL (ref 0.2–1.2)
Total Protein: 7.2 g/dL (ref 6.0–8.3)

## 2022-02-15 LAB — LIPID PANEL
Cholesterol: 156 mg/dL (ref 0–200)
HDL: 29.7 mg/dL — ABNORMAL LOW (ref >39.00)
LDL Cholesterol: 88 mg/dL (ref 0–99)
NonHDL: 126.47
Total CHOL/HDL Ratio: 5
Triglycerides: 194 mg/dL — ABNORMAL HIGH (ref 0.0–149.0)
VLDL: 38.8 mg/dL (ref 0.0–40.0)

## 2022-02-15 LAB — HEMOGLOBIN A1C: Hgb A1c MFr Bld: 7.5 % — ABNORMAL HIGH (ref 4.6–6.5)

## 2022-02-15 MED ORDER — LISINOPRIL-HYDROCHLOROTHIAZIDE 20-12.5 MG PO TABS: 1.0000 | ORAL_TABLET | Freq: Every day | ORAL | 1 refills | Status: DC

## 2022-02-15 NOTE — Assessment & Plan Note (Signed)
Blood pressure is not at goal for age and co-morbidities.   Recommendations: continue lisinopril-hctz 20-12.5, monitor at home, follow-up in 2 weeks - BP goal <130/80 - monitor and log blood pressures at home - check around the same time each day in a relaxed setting - Limit salt to <2000 mg/day - Follow DASH eating plan (heart healthy diet) - limit alcohol to 2 standard drinks per day for men and 1 per day for women - avoid tobacco products - get at least 2 hours of regular aerobic exercise weekly Patient aware of signs/symptoms requiring further/urgent evaluation. Labs updated today.

## 2022-02-15 NOTE — Patient Instructions (Addendum)
Blood pressure is not at goal for age and co-morbidities.   Recommendations: continue lisinopril-hctz 20-12.5, monitor at home, follow-up in 2 weeks - BP goal <130/80 - monitor and log blood pressures at home - check around the same time each day in a relaxed setting - Limit salt to <2000 mg/day - Follow DASH eating plan (heart healthy diet) - limit alcohol to 2 standard drinks per day for men and 1 per day for women - avoid tobacco products - get at least 2 hours of regular aerobic exercise weekly Patient aware of signs/symptoms requiring further/urgent evaluation. Labs updated today.    Thank you for choosing [facility] at [facility] for your Primary Care needs. I am excited for the opportunity to partner with you to meet your health care goals. It was a pleasure meeting you today!    Information on diet, exercise, and health maintenance recommendations are listed below. This is information to help you be sure you are on track for optimal health and monitoring.   Please look over this and let us know if you have any questions or if you have completed any of the health maintenance outside of [facility] so that we can be sure your records are up to date.  ___________________________________________________________  MyChart:  For all urgent or time sensitive needs we ask that you please call the office to avoid delays. Our number is [phone]. MyChart is not constantly monitored and due to the large volume of messages a day, replies may take up to 72 business hours.  MyChart Policy: MyChart allows for you to see your visit notes, after visit summary, provider recommendations, lab and tests results, make an appointment, request refills, and contact your provider or the office for non-urgent questions or concerns. Providers are seeing patients during normal business hours and do not have built in time to review MyChart messages.  We ask that you allow a minimum of  3 business days for responses to [employer]. For this reason, please do not send urgent requests through MyChart. Please call the office at [phone]. New and ongoing conditions may require a visit. We have virtual and in-person visits available for your convenience.  Complex MyChart concerns may require a visit. Your provider may request you schedule a virtual or in-person visit to ensure we are providing the best care possible. MyChart messages sent after 11:00 AM on Friday will not be received by the provider until Monday morning.    Lab and Test Results: You will receive your lab and test results on MyChart as soon as they are completed and results have been sent by the lab or testing facility. Due to this service, you will receive your results BEFORE your provider.  I review lab and test results each morning prior to seeing patients. Some results require collaboration with other providers to ensure you are receiving the most appropriate care. For this reason, we ask that you please allow a minimum of 3-5 business days from the time that ALL results have been received for your provider to receive and review lab and test results and contact you about these.  Most lab and test result comments from the provider will be sent through MyChart. Your provider may recommend changes to the plan of care, follow-up visits, repeat testing, ask questions, or request an office visit to discuss these results. You may reply directly to this message or call the office to provide information for the provider or set up an  appointment. In some instances, you will be called with test results and recommendations. Please let us know if this is preferred and we will make note of this in your chart to provide this for you.    If you have not heard a response to your lab or test results in 5 business days from all results returning to MyChart, please call the office to let us know. We ask that you please avoid calling  prior to this time unless there is an emergent concern. Due to high call volumes, this can delay the resulting process.  After Hours: For all non-emergency after hours needs, please call the office at [phone] and select the option to reach the on-call  service. On-call services are shared between multiple [facility] offices and therefore it will not be possible to speak directly with your provider. On-call providers may provide medical advice and recommendations, but are unable to provide refills for maintenance medications.  For all emergency or urgent medical needs after normal business hours, we recommend that you seek care at the closest Urgent Care or Emergency Department to ensure appropriate treatment in a timely manner.  [facility] [place] has a 24 hour emergency room located on the ground floor for your convenience.   Urgent Concerns During the Business Day Providers are seeing patients from 8AM to 5PM with a busy schedule and are most often not able to respond to non-urgent calls until the end of the day or the next business day. If you should have URGENT concerns during the day, please call and speak to the nurse or schedule a same day appointment so that we can address your concern without delay.   Thank you, again, for choosing me as your health care partner. I appreciate your trust and look forward to learning more about you.   Lollie Marrow Reola Calkins, DNP, FNP-C  ___________________________________________________________  Health Maintenance Recommendations Screening Testing Mammogram Every 1-2 years based on history and risk factors Starting at age 50 Pap Smear Ages 21-39 every 3 years Ages 61-65 every 5 years with HPV testing More frequent testing may be required based on results and history Colon Cancer Screening Every 1-10 years based on test performed, risk factors, and history Starting at age 18 Bone Density Screening Every 2-10 years based on  history Starting at age 66 for women Recommendations for men differ based on medication usage, history, and risk factors AAA Screening One time ultrasound Men 89-73 years old who have ever smoked Lung Cancer Screening Low Dose Lung CT every 12 months Age 9-80 years with a 20 pack-year smoking history who still smoke or who have quit within the last 15 years  Screening Labs Routine  Labs: Complete Blood Count (CBC), Complete Metabolic Panel (CMP), Cholesterol (Lipid Panel) Every 6-12 months based on history and medications May be recommended more frequently based on current conditions or previous results Hemoglobin A1c Lab Every 3-12 months based on history and previous results Starting at age 27 or earlier with diagnosis of diabetes, high cholesterol, BMI >26, and/or risk factors Frequent monitoring for patients with diabetes to ensure blood sugar control Thyroid Panel (TSH w/ T3 & T4) Every 6 months based on history, symptoms, and risk factors May be repeated more often if on medication HIV One time testing for all patients 80 and older May be repeated more frequently for patients with increased risk factors or exposure Hepatitis C One time testing for all patients 36 and older May be repeated more frequently  for patients with increased risk factors or exposure Gonorrhea, Chlamydia Every 12 months for all sexually active persons 13-24 years Additional monitoring may be recommended for those who are considered high risk or who have symptoms PSA Men 65-58 years old with risk factors Additional screening may be recommended from age 46-69 based on risk factors, symptoms, and history  Vaccine Recommendations Tetanus Booster All adults every 10 years Flu Vaccine All patients 6 months and older every year COVID Vaccine All patients 12 years and older Initial dosing with booster May recommend additional booster based on age and health history HPV Vaccine 2 doses all patients age  69-26 Dosing may be considered for patients over 26 Shingles Vaccine (Shingrix) 2 doses all adults 42 years and older Pneumonia (Pneumovax 105) All adults 56 years and older May recommend earlier dosing based on health history Pneumonia (Prevnar 81) All adults 7 years and older Dosed 1 year after Pneumovax 23 Pneumonia (Prevnar 9) All adults 38 years and older (adults 62-83 with certain conditions or risk factors) 1 dose  For those who have no received Prevnar 13 vaccine previously   Additional Screening, Testing, and Vaccinations may be recommended on an individualized basis based on family history, health history, risk factors, and/or exposure.  __________________________________________________________  Diet Recommendations for All Patients  I recommend that all patients maintain a diet low in saturated fats, carbohydrates, and cholesterol. While this can be challenging at first, it is not impossible and small changes can make big differences.  Things to try: Decreasing the amount of soda, sweet tea, and/or juice to one or less per day and replace with water While water is always the first choice, if you do not like water you may consider adding a water additive without sugar to improve the taste other sugar free drinks Replace potatoes with a brightly colored vegetable at dinner Use healthy oils, such as canola oil or olive oil, instead of butter or hard margarine Limit your bread intake to two pieces or less a day Replace regular pasta with low carb pasta options Bake, broil, or grill foods instead of frying Monitor portion sizes  Eat smaller, more frequent meals throughout the day instead of large meals  An important thing to remember is, if you love foods that are not great for your health, you don't have to give them up completely. Instead, allow these foods to be a reward when you have done well. Allowing yourself to still have special treats every once in a while is a  nice way to tell yourself thank you for working hard to keep yourself healthy.   Also remember that every day is a new day. If you have a bad day and "fall off the wagon", you can still climb right back up and keep moving along on your journey!  We have resources available to help you!  Some websites that may be helpful include: www.http://carter.biz/  Www.VeryWellFit.com _____________________________________________________________  Activity Recommendations for All Patients  I recommend that all adults get at least 20 minutes of moderate physical activity that elevates your heart rate at least 5 days out of the week.  Some examples include: Walking or jogging at a pace that allows you to carry on a conversation Cycling (stationary bike or outdoors) Water aerobics Yoga Weight lifting Dancing If physical limitations prevent you from putting stress on your joints, exercise in a pool or seated in a chair are excellent options.  Do determine your MAXIMUM heart rate for activity: 220 - YOUR  AGE = MAX Heart Rate   Remember! Do not push yourself too hard.  Start slowly and build up your pace, speed, weight, time in exercise, etc.  Allow your body to rest between exercise and get good sleep. You will need more water than normal when you are exerting yourself. Do not wait until you are thirsty to drink. Drink with a purpose of getting in at least 8, 8 ounce glasses of water a day plus more depending on how much you exercise and sweat.    If you begin to develop dizziness, chest pain, abdominal pain, jaw pain, shortness of breath, headache, vision changes, lightheadedness, or other concerning symptoms, stop the activity and allow your body to rest. If your symptoms are severe, seek emergency evaluation immediately. If your symptoms are concerning, but not severe, please let us know so that we can recommend further evaluation.

## 2022-02-15 NOTE — Progress Notes (Signed)
New Patient Office Visit  Subjective    Patient ID: [name] [name], male    DOB: 07-May-1980  Age: 42 y.o. MRN: [id number]  CC:  Chief Complaint  Patient presents with   Back Pain   Knee Pain    HPI [name] [name] presents to establish care. 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  He lives with his wife and 2 step-kids and 3 dogs.  He played football at [employer].  Knee pain (bilateral)/Back pain: Reports bilateral surgeries many years ago for wear and tear, hardware in the left knee. Reports he always has flares of pain with weather changes. States he used to get Vicodin or Oxy for it, but hasn't had any in awhile.  Today's pain is 7/10, mostly upper left back. States it started today when grabbing dog leash. Pulling sensation of pain, but full range of motion of shoulder and spine.  He is not interested in PT at this time.  HYPERTENSION: - Medications: lisinopril-hctz 20-12.5 mg daily  - Compliance: good - Checking BP at home: yes, wife keeps up with it, states it was high last week with URI/PNA - Denies any SOB, recurrent headaches, CP, vision changes, LE edema, dizziness, palpitations, or medication side effects. - Diet: general, no red meat  - Exercise: stays active    Hx of CVA (2012): Reports he had some left sided facial symptoms and ended up with Bells palsy at one point, but all symptoms have resolved. No current deficits.  Not sure if he has ever had HLD; never been on medications        Outpatient Encounter Medications as of 02/15/2022  Medication Sig   Ascorbic Acid (VITAMIN C PO) Take by mouth.   calcium carbonate (TUMS - DOSED IN MG ELEMENTAL CALCIUM) 500 MG chewable tablet Chew 1 tablet by mouth daily.   lisinopril-hydrochlorothiazide (ZESTORETIC) 20-12.5 MG tablet Take 1 tablet by mouth daily.   [DISCONTINUED] benzonatate (TESSALON) 100 MG capsule Take 1-2 capsules (100-200 mg total) by mouth 3 (three)  times daily as needed for cough.   [DISCONTINUED] Blood Pressure Monitor DEVI Use as directed to check home blood pressure 2-3 times a week   [DISCONTINUED] cetirizine (ZYRTEC ALLERGY) 10 MG tablet Take 1 tablet (10 mg total) by mouth daily.   [DISCONTINUED] diclofenac sodium (VOLTAREN) 1 % GEL Apply 2 g topically 4 (four) times daily. (Patient not taking: Reported on 12/02/2020)   [DISCONTINUED] HYDROcodone-acetaminophen (NORCO/VICODIN) 5-325 MG tablet Take 1-2 tablets by mouth every 6 (six) hours as needed.   [DISCONTINUED] ipratropium (ATROVENT) 0.03 % nasal spray Place 2 sprays into both nostrils 2 (two) times daily.   [DISCONTINUED] lidocaine (LIDODERM) 5 % Place 1 patch onto the skin daily. Remove & Discard patch within 12 hours or as directed by MD   [DISCONTINUED] lisinopril-hydrochlorothiazide (ZESTORETIC) 20-12.5 MG tablet Take 1 tablet by mouth daily.   [DISCONTINUED] naproxen (NAPROSYN) 500 MG tablet Take 1 tablet (500 mg total) by mouth 2 (two) times daily with a meal.   [DISCONTINUED] ondansetron (ZOFRAN-ODT) 4 MG disintegrating tablet Take 1 tablet (4 mg total) by mouth every 8 (eight) hours as needed for nausea or vomiting.   [DISCONTINUED] oxyCODONE-acetaminophen (PERCOCET/ROXICET) 5-325 MG tablet Take 1 tablet by mouth every 6 (six) hours as needed for severe pain.   [DISCONTINUED] pantoprazole (PROTONIX) 20 MG tablet Take 1 tablet (20 mg total) by mouth daily. (Patient not taking: Reported on 12/02/2020)   [DISCONTINUED]  potassium chloride (KLOR-CON) 10 MEQ tablet Take 1 tablet (10 mEq total) by mouth daily. (Patient not taking: Reported on 12/02/2020)   [DISCONTINUED] predniSONE (DELTASONE) 10 MG tablet Take 2 tablets (20 mg total) by mouth 2 (two) times daily with a meal.   [DISCONTINUED] promethazine (PHENERGAN) 25 MG tablet Take 1 tablet (25 mg total) by mouth every 6 (six) hours as needed for nausea or vomiting. (Patient not taking: Reported on 12/02/2020)   [DISCONTINUED]  promethazine-dextromethorphan (PROMETHAZINE-DM) 6.25-15 MG/5ML syrup Take 5 mLs by mouth at bedtime as needed for cough.   [DISCONTINUED] senna-docusate (SENOKOT-S) 8.6-50 MG tablet Take 1 tablet by mouth at bedtime as needed for mild constipation or moderate constipation.   [DISCONTINUED] tiZANidine (ZANAFLEX) 4 MG tablet Take 1 tablet (4 mg total) by mouth every 6 (six) hours as needed for muscle spasms.   No facility-administered encounter medications on file as of 02/15/2022.    Past Medical History:  Diagnosis Date   Hypercholesteremia    Hypertension    Stroke [facility])    Stroke ([place]) 2012    Past Surgical History:  Procedure Laterality Date   FOOT SURGERY     KNEE SURGERY     ORIF RADIUS & ULNA FRACTURES     ORIF TIBIA & FIBULA FRACTURES     ORTHOPEDIC SURGERY     SHOULDER SURGERY      Family History  Problem Relation Age of Onset   Diabetes Mother    Healthy Father     Social History   Socioeconomic History   Marital status: Significant Other    Spouse name: Not on file   Number of children: Not on file   Years of education: Not on file   Highest education level: Not on file  Occupational History   Not on file  Tobacco Use   Smoking status: Some Days    Types: Cigars   Smokeless tobacco: Never  Vaping Use   Vaping Use: Never used  Substance and Sexual Activity   Alcohol use: Yes    Comment: occasionally   Drug use: No   Sexual activity: Yes    Birth control/protection: None  Other Topics Concern   Not on file  Social History Narrative   Not on file   Social Determinants of Health   Financial Resource Strain: Not on file  Food Insecurity: Not on file  Transportation Needs: Not on file  Physical Activity: Not on file  Stress: Not on file  Social Connections: Not on file  Intimate Partner Violence: Not on file    ROS All review of systems negative except what is listed in the HPI      Objective    BP (!) 150/110   Pulse 81   Temp 97.9 F  (36.6 C)   Resp 16   Ht 6' 2.5" (1.892 m)   Wt (!) 407 lb (184.6 kg)   SpO2 98%   BMI 51.56 kg/m   Physical Exam Vitals reviewed.  Constitutional:      Appearance: Normal appearance.  HENT:     Head: Normocephalic and atraumatic.  Cardiovascular:     Rate and Rhythm: Normal rate and regular rhythm.  Pulmonary:     Effort: Pulmonary effort is normal.     Breath sounds: Normal breath sounds.  Musculoskeletal:        General: Normal range of motion.     Comments: Tenderness to palpation on left lower trap/lat  Skin:    General: Skin is warm  and dry.  Neurological:     Mental Status: He is alert.  Psychiatric:        Mood and Affect: Mood normal.        Behavior: Behavior normal.        Thought Content: Thought content normal.        Judgment: Judgment normal.         Assessment & Plan:   Problem List Items Addressed This Visit       Cardiovascular and Mediastinum   Hypertension - Primary    Blood pressure is not at goal for age and co-morbidities.   Recommendations: continue lisinopril-hctz 20-12.5, monitor at home, follow-up in 2 weeks - BP goal <130/80 - monitor and log blood pressures at home - check around the same time each day in a relaxed setting - Limit salt to <2000 mg/day - Follow DASH eating plan (heart healthy diet) - limit alcohol to 2 standard drinks per day for men and 1 per day for women - avoid tobacco products - get at least 2 hours of regular aerobic exercise weekly Patient aware of signs/symptoms requiring further/urgent evaluation. Labs updated today.      Relevant Medications   lisinopril-hydrochlorothiazide (ZESTORETIC) 20-12.5 MG tablet   Other Relevant Orders   Comprehensive metabolic panel   Lipid panel   TSH     Other   Morbid obesity (HCC)    Updating labs today Discussed healthy lifestyle - DASH/MIND diet, portion control, weight management, regular exercise      Relevant Orders   CBC with Differential/Platelet    Comprehensive metabolic panel   Lipid panel   TSH   HgB A1c   Other Visit Diagnoses     Chronic pain of both knees       Upper back pain on left side     Advised patient that I do not do chronic pain management. Declines any referrals at this time (ortho, PT, pain management).  For acute back pain, recommend rest, ice, heat, massage, home exercises (handout provided), tylenol, lidocaine patches. Hesitant to add NSAIDs at this time given uncontrolled BP.  Patient agreeable to plan and aware of signs/symptoms requiring further/urgent evaluation.          Return in about 2 weeks (around 03/01/2022) for BP follow-up .   Clayborne Dana, NP

## 2022-02-15 NOTE — Assessment & Plan Note (Signed)
Updating labs today Discussed healthy lifestyle - DASH/MIND diet, portion control, weight management, regular exercise

## 2022-02-16 ENCOUNTER — Telehealth: Payer: Self-pay | Admitting: Family Medicine

## 2022-02-16 DIAGNOSIS — E1165 Type 2 diabetes mellitus with hyperglycemia: Secondary | ICD-10-CM | POA: Insufficient documentation

## 2022-02-16 LAB — TSH: TSH: 0.94 u[IU]/mL (ref 0.35–5.50)

## 2022-02-16 MED ORDER — BLOOD GLUCOSE MONITOR KIT: PACK | 0 refills | Status: DC

## 2022-02-16 MED ORDER — ROSUVASTATIN CALCIUM 20 MG PO TABS: 20.0000 mg | ORAL_TABLET | Freq: Every day | ORAL | 1 refills | Status: DC

## 2022-02-16 MED ORDER — OZEMPIC (0.25 OR 0.5 MG/DOSE) 2 MG/3ML ~~LOC~~ SOPN: 0.2500 mg | PEN_INJECTOR | SUBCUTANEOUS | 2 refills | Status: DC

## 2022-02-16 NOTE — Telephone Encounter (Signed)
Pt states pharmacy has not received the order for the diabetes supplies.

## 2022-02-16 NOTE — Addendum Note (Signed)
Addended by: Caleen Jobs B on: 02/16/2022 04:56 PM   Modules accepted: Orders

## 2022-02-16 NOTE — Addendum Note (Signed)
Addended by: Caleen Jobs B on: 02/16/2022 10:14 AM   Modules accepted: Orders

## 2022-02-16 NOTE — Progress Notes (Signed)
Your liver enzymes are a little higher than they were last month. Let's recheck in 2 weeks. Please come for fasting labs and try to minimize tylenol and alcohol in the meantime. A1c is now in the diabetic range at 7.5%. Diet and lifestyle changes are very important, but it would also be helpful to start medications. We could start something oral like metformin, or if you are interested in the injectable like Ozempic or Mounjaro we could try to get one of those approved for you as well. If you would like to further discuss your options, please schedule a follow-up so we can get you started on something soon.  I would also recommend starting cholesterol medication based on new diabetes and cardiovascular risk factors. I'm going to go ahead and send in rosuvastatin (crestor). I will also send in a glucometer for you to start checking a fasting blood sugar every morning. Keep a log of these readings so we can see how you are doing.  We can further discuss at your upcoming appointment.      The 10-year ASCVD risk score (Arnett DK, et al., 2019) is: 14.9%   Values used to calculate the score:     Age: 61 years     Sex: Male     Is Non-Hispanic African American: Yes     Diabetic: No     Tobacco smoker: Yes     Systolic Blood Pressure: 132 mmHg     Is BP treated: Yes     HDL Cholesterol: 29.7 mg/dL     Total Cholesterol: 156 mg/dL

## 2022-02-17 ENCOUNTER — Telehealth: Payer: Self-pay

## 2022-02-17 DIAGNOSIS — E1165 Type 2 diabetes mellitus with hyperglycemia: Secondary | ICD-10-CM

## 2022-02-17 MED ORDER — TRULICITY 0.75 MG/0.5ML ~~LOC~~ SOAJ: 0.7500 mg | SUBCUTANEOUS | 1 refills | Status: DC

## 2022-02-17 NOTE — Telephone Encounter (Signed)
PA initiated via Covermymeds; KEY: [id number]. PA approved.   ZCHYIF:[id number];NOMVEH:[id number];Review Type:Prior Auth;Coverage Start Date:02/17/2022;Coverage End Date:02/17/2023;

## 2022-02-17 NOTE — Telephone Encounter (Signed)
Ozempic not covered by Pt's plan- preferred alternatives: Trulicity, Bydureon Bcise 0.2 auto injector, Byetta 1mcg, Byetta 20mcg

## 2022-02-17 NOTE — Telephone Encounter (Signed)
Rx changed to Trulicity.

## 2022-02-17 NOTE — Telephone Encounter (Signed)
Pt aware.

## 2022-03-01 ENCOUNTER — Ambulatory Visit: Payer: Commercial Managed Care - HMO | Admitting: Family Medicine

## 2022-03-01 ENCOUNTER — Other Ambulatory Visit (INDEPENDENT_AMBULATORY_CARE_PROVIDER_SITE_OTHER): Payer: Commercial Managed Care - HMO

## 2022-03-01 DIAGNOSIS — R748 Abnormal levels of other serum enzymes: Secondary | ICD-10-CM | POA: Diagnosis not present

## 2022-03-01 LAB — COMPREHENSIVE METABOLIC PANEL
ALT: 53 U/L (ref 0–53)
AST: 52 U/L — ABNORMAL HIGH (ref 0–37)
Albumin: 4.2 g/dL (ref 3.5–5.2)
Alkaline Phosphatase: 83 U/L (ref 39–117)
BUN: 9 mg/dL (ref 6–23)
CO2: 29 mEq/L (ref 19–32)
Calcium: 9.4 mg/dL (ref 8.4–10.5)
Chloride: 103 mEq/L (ref 96–112)
Creatinine, Ser: 0.99 mg/dL (ref 0.40–1.50)
GFR: 93.47 mL/min (ref >60.00)
Glucose, Bld: 114 mg/dL — ABNORMAL HIGH (ref 70–99)
Potassium: 3.9 mEq/L (ref 3.5–5.1)
Sodium: 138 mEq/L (ref 135–145)
Total Bilirubin: 0.5 mg/dL (ref 0.2–1.2)
Total Protein: 7.2 g/dL (ref 6.0–8.3)

## 2022-03-02 ENCOUNTER — Ambulatory Visit: Payer: Commercial Managed Care - HMO | Admitting: Family Medicine

## 2022-03-02 ENCOUNTER — Encounter: Payer: Self-pay | Admitting: Family Medicine

## 2022-03-02 ENCOUNTER — Other Ambulatory Visit: Payer: Commercial Managed Care - HMO

## 2022-03-02 VITALS — BP 156/99 | HR 69 | Temp 98.0°F | Resp 16 | Wt >= 6400 oz

## 2022-03-02 DIAGNOSIS — E1165 Type 2 diabetes mellitus with hyperglycemia: Secondary | ICD-10-CM | POA: Diagnosis not present

## 2022-03-02 DIAGNOSIS — I10 Essential (primary) hypertension: Secondary | ICD-10-CM

## 2022-03-02 MED ORDER — AMLODIPINE BESYLATE 5 MG PO TABS: 5.0000 mg | ORAL_TABLET | Freq: Every day | ORAL | 1 refills | Status: DC

## 2022-03-02 NOTE — Progress Notes (Signed)
Established Patient Office Visit  Subjective   Patient ID: [name] [name], male    DOB: [birth year]  Age: 42 y.o. MRN: [id number]  CC: HTN f/u    HPI  Patient is here today for 2 week follow-up.   He established care 2 weeks ago and was noted to be hypertensive. Labs revealed new diabetes and he was told to follow-up.  Hypertension: - Medications: lisinopril-hctz 20-12.5 mg daily - Compliance: good - Checking BP at home: occasionally, reports much better than office readings - Denies any SOB, recurrent headaches, CP, vision changes, LE edema, dizziness, palpitations, or medication side effects. - Diet: trying low sodium, low carb - Exercise: walking   Diabetes: - Checking BG at home: yes, fasting <100 - Medications: Trulicity 5.28 mg weekly - took first dose last week; rosuvastatin 20 mg daily - Compliance: good - Eye exam: pt encouraged to schedule  - Microalbumin: today - Denies symptoms of hypoglycemia, polyuria, polydipsia, numbness extremities, foot ulcers/trauma, wounds that are not healing, medication side effects  Lab Results  Component Value Date   HGBA1C 7.5 (H) 02/15/2022              ROS All review of systems negative except what is listed in the HPI    Objective:     BP (!) 156/99   Pulse 69   Temp 98 F (36.7 C)   Resp 16   Wt (!) 418 lb (189.6 kg)   SpO2 98%   BMI 52.95 kg/m    Physical Exam Vitals reviewed.  Constitutional:      Appearance: Normal appearance.  HENT:     Head: Normocephalic and atraumatic.  Cardiovascular:     Rate and Rhythm: Normal rate and regular rhythm.  Pulmonary:     Effort: Pulmonary effort is normal.     Breath sounds: Normal breath sounds.  Musculoskeletal:        General: Normal range of motion.  Skin:    General: Skin is warm and dry.  Neurological:     Mental Status: He is alert.  Psychiatric:        Mood and Affect: Mood normal.        Behavior: Behavior normal.        Thought  Content: Thought content normal.        Judgment: Judgment normal.      No results found for any visits on 03/02/22.    The 10-year ASCVD risk score (Arnett DK, et al., 2019) is: 28.2%    Assessment & Plan:   Problem List Items Addressed This Visit       Cardiovascular and Mediastinum   Hypertension    Blood pressure is not at goal for age and co-morbidities.   Recommendations: continue lisinopril-hctz 20-12.5 mg daily, start amlodipine 5 mg daily, follow-up in 2 weeks - BP goal <130/80 - monitor and log blood pressures at home - check around the same time each day in a relaxed setting - Limit salt to <2000 mg/day - Follow DASH eating plan (heart healthy diet) - limit alcohol to 2 standard drinks per day for men and 1 per day for women - avoid tobacco products - get at least 2 hours of regular aerobic exercise weekly Patient aware of signs/symptoms requiring further/urgent evaluation.      Relevant Medications   amLODipine (NORVASC) 5 MG tablet     Endocrine   Type 2 diabetes mellitus with hyperglycemia, without long-term current use of insulin ([place]) - Primary  He has had one dose of Trulicity so far and tolerated well - continue Education provided regarding lifestyle management F/u in 3 months for repeat A1c      Relevant Orders   Microalbumin / creatinine urine ratio    Return in about 2 weeks (around 03/16/2022) for BP f.u with Lovena Le .    Terrilyn Saver, NP

## 2022-03-02 NOTE — Assessment & Plan Note (Signed)
He has had one dose of Trulicity so far and tolerated well - continue Education provided regarding lifestyle management F/u in 3 months for repeat A1c

## 2022-03-02 NOTE — Assessment & Plan Note (Signed)
Blood pressure is not at goal for age and co-morbidities.   Recommendations: continue lisinopril-hctz 20-12.5 mg daily, start amlodipine 5 mg daily, follow-up in 2 weeks - BP goal <130/80 - monitor and log blood pressures at home - check around the same time each day in a relaxed setting - Limit salt to <2000 mg/day - Follow DASH eating plan (heart healthy diet) - limit alcohol to 2 standard drinks per day for men and 1 per day for women - avoid tobacco products - get at least 2 hours of regular aerobic exercise weekly Patient aware of signs/symptoms requiring further/urgent evaluation.

## 2022-03-02 NOTE — Patient Instructions (Addendum)
Blood pressure is not at goal for age and co-morbidities.   Recommendations: continue lisinopril-hctz 20-12.5 mg daily, start amlodipine 5 mg daily, follow-up in 2 weeks - BP goal <130/80 - monitor and log blood pressures at home - check around the same time each day in a relaxed setting - Limit salt to <2000 mg/day - Follow DASH eating plan (heart healthy diet) - limit alcohol to 2 standard drinks per day for men and 1 per day for women - avoid tobacco products - get at least 2 hours of regular aerobic exercise weekly Patient aware of signs/symptoms requiring further/urgent evaluation. 

## 2022-03-03 LAB — MICROALBUMIN / CREATININE URINE RATIO
Creatinine,U: 179.2 mg/dL
Microalb Creat Ratio: 0.7 mg/g (ref 0.0–30.0)
Microalb, Ur: 1.2 mg/dL (ref 0.0–1.9)

## 2022-03-13 ENCOUNTER — Ambulatory Visit (INDEPENDENT_AMBULATORY_CARE_PROVIDER_SITE_OTHER): Payer: Commercial Managed Care - HMO | Admitting: Pharmacist

## 2022-03-13 DIAGNOSIS — E1165 Type 2 diabetes mellitus with hyperglycemia: Secondary | ICD-10-CM

## 2022-03-13 DIAGNOSIS — I10 Essential (primary) hypertension: Secondary | ICD-10-CM

## 2022-03-13 MED ORDER — LISINOPRIL-HYDROCHLOROTHIAZIDE 20-12.5 MG PO TABS: 1.0000 | ORAL_TABLET | Freq: Every day | ORAL | 0 refills | Status: DC

## 2022-03-13 NOTE — Progress Notes (Signed)
Patient appearing on report for [employer] [facility] - Hypertension Control report due to last documented ambulatory blood pressure of 156/90 on 03/02/2022. Next appointment with PCP is 03/16/2022 but patient has started a new job and cannot be her on 03/16/2022.  Also assessed medication costs for all meds on his list.  Outreached patient to discuss hypertension control and medication management.   Current antihypertensives: amlodipine 5mg  daily - added 03/02/2022; lisinopril hydrochlorothiazide 20/12.5mg  daily  Patient has an automated upper arm home BP machine.  Current blood pressure readings: patient reports he has not check in the last week. Usually only checks when he has a headache.  Patient denies hypotensive signs and symptoms including no dizziness, lightheadedness.  Patient some hypertensive symptoms including occasional headache but no chest pain or shortness of breath.   Patient also had type 2 DM - he started Trulicity 0.75mg  2 weeks ago.   Assessment/Plan: Hypertension - last blood pressure not at goal.  - - Reviewed goal blood pressure <130/80 - Counseled on long term microvascular and macrovascular complications of uncontrolled hypertension - Reviewed to check blood pressure 2 to 3 times per week, document, and provide at next provider visit - Reviewed strategies to improve medication adherence such as filling meds for 90 day supplies to decrease number of trips to pharmacy - Recommend continue amlodipine and lisinopirl hydrochlorothiazide  - Collaborated with primary care provider office staff to ensure patient has scheduled follow up - rescheduled appointment form 03/16/2022 to 03/23/2022 due to work scheduling conflict  Diabetes:  Patient reports Trulicity cost was $44 - provided coupon to patient via email to lower cost to $25 / month  Med Management:  Reviewed current meds and his 2024 formulary .   Tier 1 is lisinopril hydrochlorothiazide - $3 / 30 days and $7.50 /  90 days - sent in 90 days Rx with no RF  Tier 2 - amlodipine and rosuvastatin - copay is $35 / 30 days and $87.50 / 90 days - he would likely get for lower dose if he uses [facility] Patient endorses that he already has a Good Rx card.   Will check back in with patient in 1 to 2 months to review adherence and blood pressure.  Cherre Robins, PharmD Clinical Pharmacist [facility]

## 2022-03-16 ENCOUNTER — Ambulatory Visit: Payer: Commercial Managed Care - HMO | Admitting: Family Medicine

## 2022-03-23 ENCOUNTER — Encounter: Payer: Self-pay | Admitting: Family Medicine

## 2022-03-23 ENCOUNTER — Ambulatory Visit: Payer: Commercial Managed Care - HMO | Admitting: Family Medicine

## 2022-03-23 VITALS — BP 139/89 | HR 87 | Temp 98.0°F | Resp 18 | Ht 76.0 in | Wt >= 6400 oz

## 2022-03-23 DIAGNOSIS — I10 Essential (primary) hypertension: Secondary | ICD-10-CM

## 2022-03-23 NOTE — Progress Notes (Signed)
   Established Patient Office Visit  Subjective   Patient ID: [name] [name], male    DOB: 03/11/1980  Age: 42 y.o. MRN: [id number]  Chief Complaint  Patient presents with   Follow-up    HTN    HPI  Patient is here for blood pressure follow-up. He was last seen by me on 03/02/22 and we added amlodipine 5 mg daily for HTN.  Hypertension: - Medications: amlodipine 5 mg daily, lisinopril-hctz 20-12.5 mg daily - Compliance: good - Checking BP at home: rarely - Denies any SOB, recurrent headaches, CP, vision changes, LE edema, dizziness, palpitations, or medication side effects.  BP Readings from Last 3 Encounters:  03/23/22 139/89  03/02/22 (!) 156/99  02/15/22 (!) 150/110       ROS All review of systems negative except what is listed in the HPI    Objective:     BP 139/89   Pulse 87   Temp 98 F (36.7 C)   Resp 18   Ht 6\' 4"  (1.93 m)   Wt (!) 414 lb (187.8 kg)   SpO2 98%   BMI 50.39 kg/m    Physical Exam Vitals reviewed.  Constitutional:      Appearance: Normal appearance. He is obese.  HENT:     Head: Normocephalic and atraumatic.  Cardiovascular:     Rate and Rhythm: Normal rate and regular rhythm.  Pulmonary:     Effort: Pulmonary effort is normal.     Breath sounds: Normal breath sounds.  Musculoskeletal:        General: Normal range of motion.  Skin:    General: Skin is warm and dry.  Neurological:     Mental Status: He is alert.  Psychiatric:        Mood and Affect: Mood normal.        Behavior: Behavior normal.        Thought Content: Thought content normal.        Judgment: Judgment normal.      No results found for any visits on 03/23/22.    The 10-year ASCVD risk score (Arnett DK, et al., 2019) is: 23.4%    Assessment & Plan:   Problem List Items Addressed This Visit       Cardiovascular and Mediastinum   Hypertension - Primary    Blood pressure is at goal for age and co-morbidities.   Recommendations: continue  amlodipine 5 mg daily, lisinopril-hctz 20-12.5 mg daily - BP goal <130/80 - monitor and log blood pressures at home - check around the same time each day in a relaxed setting - Limit salt to <2000 mg/day - Follow DASH eating plan (heart healthy diet) - limit alcohol to 2 standard drinks per day for men and 1 per day for women - avoid tobacco products - get at least 2 hours of regular aerobic exercise weekly Patient aware of signs/symptoms requiring further/urgent evaluation. Keep April appointment - repeat fasting labs        Return for - keep routine f/u in April .    Terrilyn Saver, NP

## 2022-03-23 NOTE — Assessment & Plan Note (Signed)
Blood pressure is at goal for age and co-morbidities.   Recommendations: continue amlodipine 5 mg daily, lisinopril-hctz 20-12.5 mg daily - BP goal <130/80 - monitor and log blood pressures at home - check around the same time each day in a relaxed setting - Limit salt to <2000 mg/day - Follow DASH eating plan (heart healthy diet) - limit alcohol to 2 standard drinks per day for men and 1 per day for women - avoid tobacco products - get at least 2 hours of regular aerobic exercise weekly Patient aware of signs/symptoms requiring further/urgent evaluation. Keep April appointment - repeat fasting labs

## 2022-04-14 ENCOUNTER — Other Ambulatory Visit: Payer: Self-pay | Admitting: Family Medicine

## 2022-04-14 DIAGNOSIS — E1165 Type 2 diabetes mellitus with hyperglycemia: Secondary | ICD-10-CM

## 2022-04-17 ENCOUNTER — Telehealth: Payer: Self-pay

## 2022-04-17 DIAGNOSIS — E1165 Type 2 diabetes mellitus with hyperglycemia: Secondary | ICD-10-CM

## 2022-04-17 MED ORDER — TRULICITY 0.75 MG/0.5ML ~~LOC~~ SOAJ: SUBCUTANEOUS | 1 refills | Status: DC

## 2022-04-17 NOTE — Addendum Note (Signed)
Addended by: Caleen Jobs B on: 04/17/2022 04:58 PM   Modules accepted: Orders

## 2022-04-17 NOTE — Telephone Encounter (Signed)
Pt states his pharmacy is out of trulicity and he needs it sent to another pharmacy. Also was feeling lightheaded, BS at 111 so transferred to triage.   [facility] [address] [place], [place] [address]

## 2022-04-17 NOTE — Telephone Encounter (Signed)
PA approved.   Approved. This drug has been approved. Approved quantity: 2 units per 28 day(s). The drug has been approved from 04/03/2022 to 04/17/2023. Please call the pharmacy to process your prescription claim. Generic or biosimilar substitution may be required when available and preferred on the formulary.

## 2022-04-17 NOTE — Telephone Encounter (Signed)
PA initiated via Covermymeds; KEY:  [id number]. Awaiting determination.

## 2022-04-18 ENCOUNTER — Telehealth: Payer: Self-pay

## 2022-04-18 NOTE — Telephone Encounter (Signed)
Spoke with pt, pt states he feels better after taking his Trulicity.

## 2022-04-18 NOTE — Telephone Encounter (Signed)
No answer/ no vm set up

## 2022-04-18 NOTE — Telephone Encounter (Signed)
Spoke with pt, pt has already picked Rx up.

## 2022-04-18 NOTE — Telephone Encounter (Signed)
Initial Comment Caller states he is feeling light headed. Sees Dr. Olevia Bowens address confirmed Translation No Nurse Assessment Nurse: Tito Dine, RN, Neoma Laming Date/Time Eilene Ghazi Time): 04/17/2022 4:50:49 PM Confirm and document reason for call. If symptomatic, describe symptoms. ---Caller states he is feeling light headed and think is because he hasn't been having the Trulicity, was supposed to have it on Sunday but was not i stock at the pharmacy. BS 111 about 30 mins ago. Does the patient have any new or worsening symptoms? ---Yes Will a triage be completed? ---Yes Related visit to physician within the last 2 weeks? ---No Does the PT have any chronic conditions? (i.e. diabetes, asthma, this includes High risk factors for pregnancy, etc.) ---Yes List chronic conditions. ---Diabetes type 2 Is this a behavioral health or substance abuse call? ---No Guidelines Guideline Title Affirmed Question Affirmed Notes Nurse Date/Time ([place] Time) Disp. Time Eilene Ghazi Time) Disposition Final User 04/17/2022 4:39:50 PM Attempt made - message left Tito Dine, RNNeoma Laming 04/17/2022 4:57:29 PM Clinical Call Yes Tito Dine, RN, Deborah Final Disposition 04/17/2022 4:57:29 PM Clinical Call Yes Tito Dine, RN, Rochel Brome NOTE: All timestamps contained within this report are represented as [place] Standard Time. CONFIDENTIALTY NOTICE: This fax transmission is intended only for the addressee. It contains information that is legally privileged, confidential or otherwise protected from use or disclosure. If you are not the intended recipient, you are strictly prohibited from reviewing, disclosing, copying using or disseminating any of this information or taking any action in reliance on or regarding this information. If you have received this fax in error, please notify us immediately by telephone so that we can arrange for its return to [place]. Phone: [phone], Toll-Free: [phone],  Fax: [phone] Page: 2 of 2 Call Id: [id number] Comments User: Nelwyn Salisbury, RN Date/Time Eilene Ghazi Time): 04/17/2022 4:56:53 PM Caller didn't wish to continue with the triage assessment, stated he was at work and that he will call back

## 2022-04-24 ENCOUNTER — Telehealth: Payer: Self-pay | Admitting: Pharmacist

## 2022-04-24 ENCOUNTER — Telehealth: Payer: Self-pay

## 2022-04-24 NOTE — Telephone Encounter (Signed)
Tried to contact patient for follow up call regarding medications and cost. Patient did not answer and was unable to LM on VM today.

## 2022-05-16 NOTE — Progress Notes (Unsigned)
   Established Patient Office Visit  Subjective   Patient ID: [name] [name], male    DOB: [birth year]  Age: 42 y.o. MRN: [id number]  No chief complaint on file.   HPI  Patient is here for routine 41-month follow-up.   Hypertension: - Medications: amlodipine 5 mg daily, lisinopril-hctz 20-12.5 mg daily - Compliance: *** - Checking BP at home: *** - Denies any SOB, recurrent headaches, CP, vision changes, LE edema, dizziness, palpitations, or medication side effects. - Diet: low sodium, low carb - Exercise: walking - Stressors:  Diabetes: - Checking BG at home: *** - Medications: Tulicity [id number] mg/week, rosuvastatin 20 mg daily - Compliance: *** - Diet: low solidum, low carb - Exercise: walking - Eye exam: *** - Foot exam: *** - Microalbumin: 03/02/22, normal  - Denies symptoms of hypoglycemia, polyuria, polydipsia, numbness extremities, foot ulcers/trauma, wounds that are not healing, medication side effects  Lab Results  Component Value Date   HGBA1C 7.5 (H) 02/15/2022   Hyperlipidemia/Hypertriglyceridemia/CVA 2012: - medications: rosuvastatin 20 mg daily,  - compliance: *** - medication SEs: *** The ASCVD Risk score (Arnett DK, et al., 2019) failed to calculate for the following reasons:   The patient has a prior MI or stroke diagnosis      {History (Optional):23778}  ROS All review of systems negative except what is listed in the HPI    Objective:     There were no vitals taken for this visit. {Vitals History (Optional):23777}  Physical Exam   No results found for any visits on 05/17/22.  {Labs (Optional):23779}  The ASCVD Risk score (Arnett DK, et al., 2019) failed to calculate for the following reasons:   The patient has a prior MI or stroke diagnosis    Assessment & Plan:   Problem List Items Addressed This Visit   None   No follow-ups on file.    Terrilyn Saver, NP

## 2022-05-17 ENCOUNTER — Encounter: Payer: Self-pay | Admitting: Family Medicine

## 2022-05-17 ENCOUNTER — Ambulatory Visit: Payer: Commercial Managed Care - HMO | Admitting: Family Medicine

## 2022-05-17 ENCOUNTER — Ambulatory Visit (INDEPENDENT_AMBULATORY_CARE_PROVIDER_SITE_OTHER): Payer: No Typology Code available for payment source | Admitting: Family Medicine

## 2022-05-17 ENCOUNTER — Telehealth: Payer: Self-pay | Admitting: Family Medicine

## 2022-05-17 VITALS — BP 148/72 | HR 82 | Ht 76.0 in | Wt >= 6400 oz

## 2022-05-17 DIAGNOSIS — Z1159 Encounter for screening for other viral diseases: Secondary | ICD-10-CM | POA: Diagnosis not present

## 2022-05-17 DIAGNOSIS — E1165 Type 2 diabetes mellitus with hyperglycemia: Secondary | ICD-10-CM

## 2022-05-17 DIAGNOSIS — L0591 Pilonidal cyst without abscess: Secondary | ICD-10-CM | POA: Diagnosis not present

## 2022-05-17 DIAGNOSIS — I10 Essential (primary) hypertension: Secondary | ICD-10-CM | POA: Diagnosis not present

## 2022-05-17 DIAGNOSIS — Z114 Encounter for screening for human immunodeficiency virus [HIV]: Secondary | ICD-10-CM

## 2022-05-17 DIAGNOSIS — E781 Pure hyperglyceridemia: Secondary | ICD-10-CM | POA: Insufficient documentation

## 2022-05-17 MED ORDER — AMLODIPINE BESYLATE 5 MG PO TABS: 5.0000 mg | ORAL_TABLET | Freq: Every day | ORAL | 1 refills | Status: DC

## 2022-05-17 MED ORDER — ROSUVASTATIN CALCIUM 20 MG PO TABS: 20.0000 mg | ORAL_TABLET | Freq: Every day | ORAL | 1 refills | Status: DC

## 2022-05-17 MED ORDER — TRULICITY 1.5 MG/0.5ML ~~LOC~~ SOAJ
1.5000 mg | SUBCUTANEOUS | 1 refills | Status: DC
Start: 2022-05-17 — End: 2023-01-19

## 2022-05-17 NOTE — Assessment & Plan Note (Signed)
Blood pressure is not at goal for age and co-morbidities.   Recommendations: continue current meds, follow-up in 2 weeks for recheck  - BP goal <130/80 - monitor and log blood pressures at home - check around the same time each day in a relaxed setting - Limit salt to <2000 mg/day - Follow DASH eating plan (heart healthy diet) - limit alcohol to 2 standard drinks per day for men and 1 per day for women - avoid tobacco products - get at least 2 hours of regular aerobic exercise weekly Patient aware of signs/symptoms requiring further/urgent evaluation. Labs updated today.

## 2022-05-17 NOTE — Telephone Encounter (Signed)
[facility] stated they received the opthalmology referral as well as the general surgery referral. Just fyi, not sure if opthalmology has received the referral.

## 2022-05-17 NOTE — Assessment & Plan Note (Signed)
Recheck cholesterol/triglycerides today. Continue rosuvastatin Lifestyle factors for lowering cholesterol include: Diet therapy - heart-healthy diet rich in fruits, veggies, fiber-rich whole grains, lean meats, chicken, fish (at least twice a week), fat-free or 1% dairy products; foods low in saturated/trans fats, cholesterol, sodium, and sugar. Mediterranean diet has shown to be very heart healthy. Regular exercise - recommend at least 30 minutes a day, 5 times per week Weight management

## 2022-05-17 NOTE — Assessment & Plan Note (Signed)
Fairly controlled with last A1c 99991111 Increase Trulicity UTD on vaccines, foot exam Referral for eye exam On ACEi  On Statin  Discussed diet and exercise F/u in 3 months

## 2022-05-17 NOTE — Patient Instructions (Signed)
Fairly controlled with last A1c 99991111 Increase Trulicity UTD on vaccines, foot exam Referral for eye exam On ACEi  On Statin  Discussed diet and exercise F/u in 3 months   Blood pressure is not at goal for age and co-morbidities.   Recommendations: continue current meds, follow-up in 2 weeks for recheck  - BP goal <130/80 - monitor and log blood pressures at home - check around the same time each day in a relaxed setting - Limit salt to <2000 mg/day - Follow DASH eating plan (heart healthy diet) - limit alcohol to 2 standard drinks per day for men and 1 per day for women - avoid tobacco products - get at least 2 hours of regular aerobic exercise weekly Patient aware of signs/symptoms requiring further/urgent evaluation. Labs updated today.   Recheck cholesterol today. Continue rosuvastatin Lifestyle factors for lowering cholesterol include: Diet therapy - heart-healthy diet rich in fruits, veggies, fiber-rich whole grains, lean meats, chicken, fish (at least twice a week), fat-free or 1% dairy products; foods low in saturated/trans fats, cholesterol, sodium, and sugar. Mediterranean diet has shown to be very heart healthy. Regular exercise - recommend at least 30 minutes a day, 5 times per week Weight management

## 2022-05-17 NOTE — Telephone Encounter (Signed)
Can we make sure the eye exam is sent to ophthalmology?

## 2022-05-18 LAB — COMPREHENSIVE METABOLIC PANEL
ALT: 44 U/L (ref 0–53)
AST: 47 U/L — ABNORMAL HIGH (ref 0–37)
Albumin: 4.3 g/dL (ref 3.5–5.2)
Alkaline Phosphatase: 80 U/L (ref 39–117)
BUN: 10 mg/dL (ref 6–23)
CO2: 28 mEq/L (ref 19–32)
Calcium: 9.5 mg/dL (ref 8.4–10.5)
Chloride: 102 mEq/L (ref 96–112)
Creatinine, Ser: 1.02 mg/dL (ref 0.40–1.50)
GFR: 90.05 mL/min (ref >60.00)
Glucose, Bld: 116 mg/dL — ABNORMAL HIGH (ref 70–99)
Potassium: 3.5 mEq/L (ref 3.5–5.1)
Sodium: 140 mEq/L (ref 135–145)
Total Bilirubin: 0.6 mg/dL (ref 0.2–1.2)
Total Protein: 7 g/dL (ref 6.0–8.3)

## 2022-05-18 LAB — LIPID PANEL
Cholesterol: 121 mg/dL (ref 0–200)
HDL: 32.2 mg/dL — ABNORMAL LOW (ref >39.00)
LDL Cholesterol: 60 mg/dL (ref 0–99)
NonHDL: 89.08
Total CHOL/HDL Ratio: 4
Triglycerides: 147 mg/dL (ref 0.0–149.0)
VLDL: 29.4 mg/dL (ref 0.0–40.0)

## 2022-05-18 LAB — HEPATITIS C ANTIBODY: Hepatitis C Ab: NONREACTIVE

## 2022-05-18 LAB — HEMOGLOBIN A1C: Hgb A1c MFr Bld: 6.6 % — ABNORMAL HIGH (ref 4.6–6.5)

## 2022-05-18 LAB — HIV ANTIBODY (ROUTINE TESTING W REFLEX): HIV 1&2 Ab, 4th Generation: NONREACTIVE

## 2022-05-19 ENCOUNTER — Telehealth: Payer: Self-pay

## 2022-05-19 NOTE — Telephone Encounter (Signed)
PA approved.   Approved. This drug has been approved. Approved quantity: 6 units per 84 day(s). The drug has been approved from 05/05/2022 to 05/19/2023. Please call the pharmacy to process your prescription claim. Generic or biosimilar substitution may be required when available and preferred on the formulary.

## 2022-05-19 NOTE — Telephone Encounter (Signed)
PA initiated via Covermymeds; KEY: [id number]. Awaiting determination.

## 2022-05-31 ENCOUNTER — Ambulatory Visit: Payer: Self-pay | Admitting: Family Medicine

## 2022-05-31 LAB — HM DIABETES EYE EXAM

## 2022-06-06 NOTE — Progress Notes (Deleted)
   Established Patient Office Visit  Subjective   Patient ID: [name] [name], male    DOB: 08-Jun-1980  Age: 42 y.o. MRN: [id number]  No chief complaint on file.   HPI  Patient is here for routine 2-week blood pressure follow-up.   Hypertension:*** - Medications: amlodipine 5 mg daily, lisinopril-hctz 20-12.5 mg daily - Compliance: good - Checking BP at home: rarely - Denies any SOB, recurrent headaches, CP, vision changes, LE edema, dizziness, palpitations, or medication side effects. - Diet: low sodium, low carb - Exercise: walking     ROS All review of systems negative except what is listed in the HPI    Objective:     There were no vitals taken for this visit.   Physical Exam Vitals reviewed.  Constitutional:      Appearance: Normal appearance. He is obese.  HENT:     Head: Normocephalic and atraumatic.  Cardiovascular:     Rate and Rhythm: Normal rate and regular rhythm.  Pulmonary:     Effort: Pulmonary effort is normal.     Breath sounds: Normal breath sounds.  Musculoskeletal:        General: Normal range of motion.  Skin:    General: Skin is warm and dry.  Neurological:     Mental Status: He is alert.  Psychiatric:        Mood and Affect: Mood normal.        Behavior: Behavior normal.        Thought Content: Thought content normal.        Judgment: Judgment normal.      No results found for any visits on 06/07/22.    The ASCVD Risk score (Arnett DK, et al., 2019) failed to calculate for the following reasons:   The patient has a prior MI or stroke diagnosis    Assessment & Plan:   Problem List Items Addressed This Visit   None  No follow-ups on file.    Clayborne Dana, NP

## 2022-06-07 ENCOUNTER — Ambulatory Visit: Payer: Self-pay | Admitting: Family Medicine

## 2022-06-07 ENCOUNTER — Ambulatory Visit: Payer: No Typology Code available for payment source | Admitting: Family Medicine

## 2022-06-09 ENCOUNTER — Ambulatory Visit: Payer: Self-pay | Admitting: Surgery

## 2022-06-14 ENCOUNTER — Ambulatory Visit: Payer: Self-pay | Admitting: Family Medicine

## 2022-06-21 ENCOUNTER — Encounter: Payer: Self-pay | Admitting: Family Medicine

## 2022-06-21 ENCOUNTER — Ambulatory Visit (INDEPENDENT_AMBULATORY_CARE_PROVIDER_SITE_OTHER): Payer: Commercial Managed Care - HMO | Admitting: Family Medicine

## 2022-06-21 VITALS — BP 131/74 | HR 83 | Ht 76.0 in | Wt >= 6400 oz

## 2022-06-21 DIAGNOSIS — I10 Essential (primary) hypertension: Secondary | ICD-10-CM | POA: Diagnosis not present

## 2022-06-21 DIAGNOSIS — L989 Disorder of the skin and subcutaneous tissue, unspecified: Secondary | ICD-10-CM

## 2022-06-21 NOTE — Assessment & Plan Note (Signed)
Blood pressure is at goal for age and co-morbidities.   Recommendations: continue lisinopril-hctz 20-12.5 mg daily, amlodipine 5 mg daily  - BP goal <130/80 - monitor and log blood pressures at home - check around the same time each day in a relaxed setting - Limit salt to <2000 mg/day - Follow DASH eating plan (heart healthy diet) - limit alcohol to 2 standard drinks per day for men and 1 per day for women - avoid tobacco products - get at least 2 hours of regular aerobic exercise weekly Patient aware of signs/symptoms requiring further/urgent evaluation.   

## 2022-06-21 NOTE — Progress Notes (Signed)
   Established Patient Office Visit  Subjective   Patient ID: [name] [name], male    DOB: October 10, 1980  Age: 42 y.o. MRN: [id number]  Chief Complaint  Patient presents with   Medical Management of Chronic Issues     HPI  Patient is here for blood pressure follow-up.   Hypertension: - Medications: amlodipine 5 mg daily, lisinopril-hctz 20-12.5 mg daily - Compliance: good - Checking BP at home: rarely - Denies any SOB, recurrent headaches, CP, vision changes, LE edema, dizziness, palpitations, or medication side effects. - Diet: low sodium, low carb - Exercise: walking   He has noticed a callous-type area to the bottom of left foot. States it is hard, but can be painful when walking. He has not been messing with it, but would like me to take a look since he is diabetic. No other changes to feet/skin.    ROS All review of systems negative except what is listed in the HPI    Objective:     BP 131/74   Pulse 83   Ht 6\' 4"  (1.93 m)   Wt (!) 404 lb (183.3 kg)   SpO2 97%   BMI 49.18 kg/m    Physical Exam Vitals reviewed.  Constitutional:      Appearance: Normal appearance. He is obese.  HENT:     Head: Normocephalic and atraumatic.  Cardiovascular:     Rate and Rhythm: Normal rate and regular rhythm.  Pulmonary:     Effort: Pulmonary effort is normal.     Breath sounds: Normal breath sounds.  Musculoskeletal:        General: Normal range of motion.  Skin:    General: Skin is warm and dry.  Neurological:     Mental Status: He is alert.  Psychiatric:        Mood and Affect: Mood normal.        Behavior: Behavior normal.        Thought Content: Thought content normal.        Judgment: Judgment normal.      No results found for any visits on 06/21/22.    The ASCVD Risk score (Arnett DK, et al., 2019) failed to calculate for the following reasons:   The patient has a prior MI or stroke diagnosis    Assessment & Plan:   Problem List Items Addressed  This Visit     Hypertension - Primary (Chronic)    Blood pressure is at goal for age and co-morbidities.   Recommendations: continue lisinopril-hctz 20-12.5 mg daily, amlodipine 5 mg daily  - BP goal <130/80 - monitor and log blood pressures at home - check around the same time each day in a relaxed setting - Limit salt to <2000 mg/day - Follow DASH eating plan (heart healthy diet) - limit alcohol to 2 standard drinks per day for men and 1 per day for women - avoid tobacco products - get at least 2 hours of regular aerobic exercise weekly Patient aware of signs/symptoms requiring further/urgent evaluation.      Other Visit Diagnoses     Skin lesion of foot     Callous/corn to foot. No signs of infection. Educated on supportive measures and symptoms to monitor for.      Return in about 6 months (around 12/22/2022) for routine follow-up (chronic disease management).    Clayborne Dana, NP

## 2022-06-21 NOTE — Patient Instructions (Signed)
Blood pressure is at goal for age and co-morbidities.   Recommendations: continue lisinopril-hctz 20-12.5 mg daily, amlodipine 5 mg daily  - BP goal <130/80 - monitor and log blood pressures at home - check around the same time each day in a relaxed setting - Limit salt to <2000 mg/day - Follow DASH eating plan (heart healthy diet) - limit alcohol to 2 standard drinks per day for men and 1 per day for women - avoid tobacco products - get at least 2 hours of regular aerobic exercise weekly Patient aware of signs/symptoms requiring further/urgent evaluation.

## 2022-06-23 NOTE — Pre-Procedure Instructions (Signed)
Surgical Instructions    Your procedure is scheduled on Jun 29, 2022.  Report to [facility] Main Entrance "A" at 7:00 A.M., then check in with the Admitting office.  Call this number if you have problems the morning of surgery:  [phone]  If you have any questions prior to your surgery date call [phone]: Open Monday-Friday 8am-4pm If you experience any cold or flu symptoms such as cough, fever, chills, shortness of breath, etc. between now and your scheduled surgery, please notify us at the above number.     Remember:  Do not eat after midnight the night before your surgery  You may drink clear liquids until 6:00 AM the morning of your surgery.   Clear liquids allowed are: Water, Non-Citrus Juices (without pulp), Carbonated Beverages, Clear Tea, Black Coffee Only (NO MILK, CREAM OR POWDERED CREAMER of any kind), and Gatorade.     Take these medicines the morning of surgery with A SIP OF WATER:  amLODipine (NORVASC)   doxycycline (MONODOX)   rosuvastatin (CRESTOR)   fexofenadine (ALLEGRA) - may take if needed   As of today, STOP taking any Aspirin (unless otherwise instructed by your surgeon) Aleve, Naproxen, Ibuprofen, Motrin, Advil, Goody's, BC's, all herbal medications, fish oil, and all vitamins.   WHAT DO I DO ABOUT MY DIABETES MEDICATION?   STOP taking your Dulaglutide (TRULICITY) one week prior to surgery.      HOW TO MANAGE YOUR DIABETES BEFORE AND AFTER SURGERY  Why is it important to control my blood sugar before and after surgery? Improving blood sugar levels before and after surgery helps healing and can limit problems. A way of improving blood sugar control is eating a healthy diet by:  Eating less sugar and carbohydrates  Increasing activity/exercise  Talking with your doctor about reaching your blood sugar goals High blood sugars (greater than 180 mg/dL) can raise your risk of infections and slow your recovery, so you will need to focus on  controlling your diabetes during the weeks before surgery. Make sure that the doctor who takes care of your diabetes knows about your planned surgery including the date and location.  How do I manage my blood sugar before surgery? Check your blood sugar at least 4 times a day, starting 2 days before surgery, to make sure that the level is not too high or low.  Check your blood sugar the morning of your surgery when you wake up and every 2 hours until you get to the Short Stay unit.  If your blood sugar is less than 70 mg/dL, you will need to treat for low blood sugar: Do not take insulin. Treat a low blood sugar (less than 70 mg/dL) with  cup of clear juice (cranberry or apple), 4 glucose tablets, OR glucose gel. Recheck blood sugar in 15 minutes after treatment (to make sure it is greater than 70 mg/dL). If your blood sugar is not greater than 70 mg/dL on recheck, call [phone] for further instructions. Report your blood sugar to the short stay nurse when you get to Short Stay.  If you are admitted to the hospital after surgery: Your blood sugar will be checked by the staff and you will probably be given insulin after surgery (instead of oral diabetes medicines) to make sure you have good blood sugar levels. The goal for blood sugar control after surgery is 80-180 mg/dL.  Do NOT Smoke (Tobacco/Vaping) for 24 hours prior to your procedure.  If you use a CPAP at night, you may bring your mask/headgear for your overnight stay.   Contacts, glasses, piercing's, hearing aid's, dentures or partials may not be worn into surgery, please bring cases for these belongings.    For patients admitted to the hospital, discharge time will be determined by your treatment team.   Patients discharged the day of surgery will not be allowed to drive home, and someone needs to stay with them for 24 hours.  SURGICAL WAITING ROOM VISITATION Patients having surgery or a procedure may  have no more than 2 support people in the waiting area - these visitors may rotate.   Children under the age of 30 must have an adult with them who is not the patient. If the patient needs to stay at the hospital during part of their recovery, the visitor guidelines for inpatient rooms apply. Pre-op nurse will coordinate an appropriate time for 1 support person to accompany patient in pre-op.  This support person may not rotate.   Please refer to the [facility] website for the visitor guidelines for Inpatients (after your surgery is over and you are in a regular room).    Special instructions:   [facility]- Preparing For Surgery  Before surgery, you can play an important role. Because skin is not sterile, your skin needs to be as free of germs as possible. You can reduce the number of germs on your skin by washing with CHG (chlorahexidine gluconate) Soap before surgery.  CHG is an antiseptic cleaner which kills germs and bonds with the skin to continue killing germs even after washing.    Oral Hygiene is also important to reduce your risk of infection.  Remember - BRUSH YOUR TEETH THE MORNING OF SURGERY WITH YOUR REGULAR TOOTHPASTE  Please do not use if you have an allergy to CHG or antibacterial soaps. If your skin becomes reddened/irritated stop using the CHG.  Do not shave (including legs and underarms) for at least 48 hours prior to first CHG shower. It is OK to shave your face.  Please follow these instructions carefully.   Shower the NIGHT BEFORE SURGERY and the MORNING OF SURGERY  If you chose to wash your hair, wash your hair first as usual with your normal shampoo.  After you shampoo, rinse your hair and body thoroughly to remove the shampoo.  Use CHG Soap as you would any other liquid soap. You can apply CHG directly to the skin and wash gently with a scrungie or a clean washcloth.   Apply the CHG Soap to your body ONLY FROM THE NECK DOWN.  Do not use on open wounds or open  sores. Avoid contact with your eyes, ears, mouth and genitals (private parts). Wash Face and genitals (private parts)  with your normal soap.   Wash thoroughly, paying special attention to the area where your surgery will be performed.  Thoroughly rinse your body with warm water from the neck down.  DO NOT shower/wash with your normal soap after using and rinsing off the CHG Soap.  Pat yourself dry with a CLEAN TOWEL.  Wear CLEAN PAJAMAS to bed the night before surgery  Place CLEAN SHEETS on your bed the night before your surgery  DO NOT SLEEP WITH PETS.   Day of Surgery: Take a shower with CHG soap. Do not wear jewelry or makeup Do not wear lotions, powders, perfumes/colognes, or deodorant. Do not shave 48  hours prior to surgery.  Men may shave face and neck. Do not bring valuables to the hospital.  [facility] is not responsible for any belongings or valuables. Do not wear nail polish, gel polish, artificial nails, or any other type of covering on natural nails (fingers and toes) If you have artificial nails or gel coating that need to be removed by a nail salon, please have this removed prior to surgery. Artificial nails or gel coating may interfere with anesthesia's ability to adequately monitor your vital signs.  Wear Clean/Comfortable clothing the morning of surgery Remember to brush your teeth WITH YOUR REGULAR TOOTHPASTE.   Please read over the following fact sheets that you were given.    If you received a COVID test during your pre-op visit  it is requested that you wear a mask when out in public, stay away from anyone that may not be feeling well and notify your surgeon if you develop symptoms. If you have been in contact with anyone that has tested positive in the last 10 days please notify you surgeon.

## 2022-06-26 ENCOUNTER — Encounter (HOSPITAL_COMMUNITY)
Admission: RE | Admit: 2022-06-26 | Discharge: 2022-06-26 | Disposition: A | Discharge status: Home / Self Care | Payer: Commercial Managed Care - HMO | Source: Ambulatory Visit | Attending: Surgery | Admitting: Surgery

## 2022-06-26 ENCOUNTER — Other Ambulatory Visit: Payer: Self-pay

## 2022-06-26 ENCOUNTER — Encounter (HOSPITAL_COMMUNITY): Payer: Self-pay

## 2022-06-26 VITALS — BP 168/98 | HR 86 | Temp 98.0°F | Resp 18 | Ht 76.0 in | Wt >= 6400 oz

## 2022-06-26 DIAGNOSIS — E119 Type 2 diabetes mellitus without complications: Secondary | ICD-10-CM | POA: Diagnosis not present

## 2022-06-26 DIAGNOSIS — G4733 Obstructive sleep apnea (adult) (pediatric): Secondary | ICD-10-CM | POA: Diagnosis not present

## 2022-06-26 DIAGNOSIS — Z8673 Personal history of transient ischemic attack (TIA), and cerebral infarction without residual deficits: Secondary | ICD-10-CM | POA: Diagnosis not present

## 2022-06-26 DIAGNOSIS — Z01812 Encounter for preprocedural laboratory examination: Secondary | ICD-10-CM | POA: Insufficient documentation

## 2022-06-26 DIAGNOSIS — I10 Essential (primary) hypertension: Secondary | ICD-10-CM | POA: Insufficient documentation

## 2022-06-26 DIAGNOSIS — Z6841 Body Mass Index (BMI) 40.0 and over, adult: Secondary | ICD-10-CM | POA: Insufficient documentation

## 2022-06-26 HISTORY — DX: Type 2 diabetes mellitus without complications: E11.9

## 2022-06-26 HISTORY — DX: Unspecified osteoarthritis, unspecified site: M19.90

## 2022-06-26 HISTORY — DX: Sleep apnea, unspecified: G47.30

## 2022-06-26 HISTORY — DX: Pneumonia, unspecified organism: J18.9

## 2022-06-26 LAB — BASIC METABOLIC PANEL
Anion gap: 9 (ref 5–15)
BUN: 9 mg/dL (ref 6–20)
CO2: 26 mmol/L (ref 22–32)
Calcium: 8.9 mg/dL (ref 8.9–10.3)
Chloride: 102 mmol/L (ref 98–111)
Creatinine, Ser: 0.98 mg/dL (ref 0.61–1.24)
GFR, Estimated: >60 mL/min (ref >60)
Glucose, Bld: 126 mg/dL — ABNORMAL HIGH (ref 70–99)
Potassium: 3.5 mmol/L (ref 3.5–5.1)
Sodium: 137 mmol/L (ref 135–145)

## 2022-06-26 LAB — CBC
HCT: 47.2 % (ref 39.0–52.0)
Hemoglobin: 15.5 g/dL (ref 13.0–17.0)
MCH: 28.7 pg (ref 26.0–34.0)
MCHC: 32.8 g/dL (ref 30.0–36.0)
MCV: 87.4 fL (ref 80.0–100.0)
Platelets: 250 10*3/uL (ref 150–400)
RBC: 5.4 MIL/uL (ref 4.22–5.81)
RDW: 13.4 % (ref 11.5–15.5)
WBC: 9.1 10*3/uL (ref 4.0–10.5)
nRBC: 0 % (ref 0.0–0.2)

## 2022-06-26 LAB — GLUCOSE, CAPILLARY: Glucose-Capillary: 141 mg/dL — ABNORMAL HIGH (ref 70–99)

## 2022-06-26 NOTE — Progress Notes (Signed)
PCP - Hyman Hopes, NP Cardiologist - Per pt, saw one for one year after his stroke, but then was only PRN follow-up per MD  PPM/ICD - Denies Device Orders - n/a Rep Notified - n/a   Chest x-ray - 02/07/2022 EKG - 02/07/2022 Stress Test - Per pt, many years ago. Result normal  ECHO - 05/09/2008 Cardiac Cath - Denies  Sleep Study - +OSA, but patient unable to tolerate wearing CPAP  Pt is DM2. He checks his blood sugar every morning. Normal fasting range is 100-110. CBG at pre-op visit 141. Pt had BBQ chicken wings, potato wedges and a ginger ale prior to appointment. Last A1c was 6.6 on April 3rd.   Last dose of GLP1 agonist- Last dose of Trulicity was May 10th GLP1 instructions: Pt instructed to not take anymore doses until after surgery. Discussed with Antionette Poles, PA-C  Blood Thinner Instructions: n/a Aspirin Instructions: n/a  ERAS Protcol - Clear liquids until 0600 morning of surgery PRE-SURGERY Ensure or G2- n/a  COVID TEST- n/a   Anesthesia review: Yes. 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  Patient denies shortness of breath, fever, cough and chest pain at PAT appointment. Pt denies any respiratory illness/infection the last two months.   All instructions explained to the patient, with a verbal understanding of the material. Patient agrees to go over the instructions while at home for a better understanding. Patient also instructed to self quarantine after being tested for COVID-19. The opportunity to ask questions was provided.

## 2022-06-27 NOTE — Anesthesia Preprocedure Evaluation (Signed)
Anesthesia Evaluation  Patient identified by MRN, date of birth, ID band Patient awake    Reviewed: Allergy & Precautions, NPO status , Patient's Chart, lab work & pertinent test results  Airway Mallampati: III  TM Distance: >3 FB Neck ROM: Full    Dental  (+) Teeth Intact, Dental Advisory Given   Pulmonary sleep apnea , Current Smoker and Patient abstained from smoking.   breath sounds clear to auscultation       Cardiovascular hypertension, Pt. on medications  Rhythm:Regular Rate:Normal     Neuro/Psych CVA  negative psych ROS   GI/Hepatic negative GI ROS, Neg liver ROS,,,  Endo/Other  diabetes    Renal/GU negative Renal ROS     Musculoskeletal  (+) Arthritis ,    Abdominal   Peds  Hematology negative hematology ROS (+)   Anesthesia Other Findings   Reproductive/Obstetrics                             Anesthesia Physical Anesthesia Plan  ASA: 3  Anesthesia Plan: General   Post-op Pain Management: Tylenol PO (pre-op)* and Gabapentin PO (pre-op)*   Induction: Intravenous  PONV Risk Score and Plan: 2 and Ondansetron, Dexamethasone and Midazolam  Airway Management Planned: Oral ETT  Additional Equipment: None  Intra-op Plan:   Post-operative Plan: Extubation in OR  Informed Consent: I have reviewed the patients History and Physical, chart, labs and discussed the procedure including the risks, benefits and alternatives for the proposed anesthesia with the patient or authorized representative who has indicated his/her understanding and acceptance.     Dental advisory given  Plan Discussed with: CRNA  Anesthesia Plan Comments: (PAT note by Antionette Poles, PA-C: 42 year old male with pertinent history including HTN, OSA intolerant to CPAP, non-insulin-dependent DM2 (A1c 6.6 on 05/17/2022), morbid obesity (BMI 49).  It is listed in patient history that he had a stroke in 2012.  Review  of documentation from that time shows it was likely a TIA felt secondary to hypertensive urgency.  Per discharge summary 07/21/2010, "Probable TIA:  The patient's TIA was likely triggered by hypertensive emergency.  Serial CT scans were performed since he could not have an MRI and these were negative.  The full stroke evaluation with carotid Dopplers and two-dimensional echocardiogram were otherwise unrevealing.  This point, the patient will be discharged on aspirin and blood pressure control medications as well as lipid control medications.  He has been advised of his risk factors for stroke and instructed that he will need to control his risk factors to prevent further problems with stroke."  Chronic conditions are managed by PCP Hyman Hopes, NP.  Last seen 06/21/2022 for follow-up of hypertension.  Blood pressure at that time 131/74, no changes to management.  Patient on once weekly GLP-1 agonist Trulicity.  Last dose 06/23/2022.  Preop labs reviewed, WNL.  EKG 02/07/2022: Sinus rhythm.  Rate 81.  Borderline T abnormalities, inferior leads.  No significant change.   )        Anesthesia Quick Evaluation

## 2022-06-27 NOTE — Progress Notes (Signed)
Anesthesia Chart Review:  42 year old male with pertinent history including HTN, OSA intolerant to CPAP, non-insulin-dependent DM2 (A1c 6.6 on 05/17/2022), morbid obesity (BMI 49).  It is listed in patient history that he had a stroke in 2012.  Review of documentation from that time shows it was likely a TIA felt secondary to hypertensive urgency.  Per discharge summary 07/21/2010, "Probable TIA:  The patient's TIA was likely triggered by hypertensive emergency.  Serial CT scans were performed since he could not have an MRI and these were negative.  The full stroke evaluation with carotid Dopplers and two-dimensional echocardiogram were otherwise unrevealing.  This point, the patient will be discharged on aspirin and blood pressure control medications as well as lipid control medications.  He has been advised of his risk factors for stroke and instructed that he will need to control his risk factors to prevent further problems with stroke."  Chronic conditions are managed by PCP Hyman Hopes, NP.  Last seen 06/21/2022 for follow-up of hypertension.  Blood pressure at that time 131/74, no changes to management.  Patient on once weekly GLP-1 agonist Trulicity.  Last dose 06/23/2022.  Preop labs reviewed, WNL.  EKG 02/07/2022: Sinus rhythm.  Rate 81.  Borderline T abnormalities, inferior leads.  No significant change.    Zannie [facility]ove [facility]/Anesthesiology Phone [phone] 06/27/2022 2:36 PM

## 2022-06-29 ENCOUNTER — Ambulatory Visit (HOSPITAL_BASED_OUTPATIENT_CLINIC_OR_DEPARTMENT_OTHER): Payer: Commercial Managed Care - HMO | Admitting: Physician Assistant

## 2022-06-29 ENCOUNTER — Ambulatory Visit (HOSPITAL_COMMUNITY)
Admission: RE | Admit: 2022-06-29 | Discharge: 2022-06-29 | Disposition: A | Discharge status: Home / Self Care | Payer: Commercial Managed Care - HMO | Attending: Surgery | Admitting: Surgery

## 2022-06-29 ENCOUNTER — Other Ambulatory Visit: Payer: Self-pay

## 2022-06-29 ENCOUNTER — Encounter (HOSPITAL_COMMUNITY)
Admission: RE | Disposition: A | Discharge status: Home / Self Care | Payer: Self-pay | Source: Home / Self Care | Attending: Surgery

## 2022-06-29 ENCOUNTER — Ambulatory Visit (HOSPITAL_COMMUNITY): Payer: Commercial Managed Care - HMO | Admitting: Physician Assistant

## 2022-06-29 ENCOUNTER — Encounter (HOSPITAL_COMMUNITY): Payer: Self-pay | Admitting: Surgery

## 2022-06-29 DIAGNOSIS — Z6841 Body Mass Index (BMI) 40.0 and over, adult: Secondary | ICD-10-CM | POA: Insufficient documentation

## 2022-06-29 DIAGNOSIS — E119 Type 2 diabetes mellitus without complications: Secondary | ICD-10-CM

## 2022-06-29 DIAGNOSIS — L0591 Pilonidal cyst without abscess: Secondary | ICD-10-CM | POA: Insufficient documentation

## 2022-06-29 DIAGNOSIS — F17210 Nicotine dependence, cigarettes, uncomplicated: Secondary | ICD-10-CM

## 2022-06-29 DIAGNOSIS — I10 Essential (primary) hypertension: Secondary | ICD-10-CM | POA: Diagnosis not present

## 2022-06-29 HISTORY — PX: PILONIDAL CYST EXCISION: SHX744

## 2022-06-29 LAB — GLUCOSE, CAPILLARY
Glucose-Capillary: 143 mg/dL — ABNORMAL HIGH (ref 70–99)
Glucose-Capillary: 127 mg/dL — ABNORMAL HIGH (ref 70–99)

## 2022-06-29 SURGERY — EXCISION, PILONIDAL CYST, EXTENSIVE
Anesthesia: General

## 2022-06-29 MED ORDER — IBUPROFEN 800 MG PO TABS: 800.0000 mg | ORAL_TABLET | Freq: Three times a day (TID) | ORAL | 0 refills | Status: DC | PRN

## 2022-06-29 MED ORDER — DEXMEDETOMIDINE HCL IN NACL 80 MCG/20ML IV SOLN
INTRAVENOUS | Status: DC | PRN
  Administered 2022-06-29: 12 ug via INTRAVENOUS

## 2022-06-29 MED ORDER — MIDAZOLAM HCL 2 MG/2ML IJ SOLN
INTRAMUSCULAR | Status: AC
  Filled 2022-06-29: qty 2

## 2022-06-29 MED ORDER — PROPOFOL 10 MG/ML IV BOLUS
INTRAVENOUS | Status: DC | PRN
  Administered 2022-06-29: 150 mg via INTRAVENOUS

## 2022-06-29 MED ORDER — LACTATED RINGERS IV SOLN: INTRAVENOUS | Status: DC

## 2022-06-29 MED ORDER — OXYCODONE HCL 5 MG PO TABS
5.0000 mg | ORAL_TABLET | Freq: Once | ORAL | Status: AC | PRN
  Administered 2022-06-29: 5 mg via ORAL

## 2022-06-29 MED ORDER — DEXAMETHASONE SODIUM PHOSPHATE 10 MG/ML IJ SOLN
INTRAMUSCULAR | Status: DC | PRN
  Administered 2022-06-29: 10 mg via INTRAVENOUS

## 2022-06-29 MED ORDER — SUGAMMADEX SODIUM 200 MG/2ML IV SOLN
INTRAVENOUS | Status: DC | PRN
  Administered 2022-06-29: 400 mg via INTRAVENOUS

## 2022-06-29 MED ORDER — OXYCODONE HCL 5 MG/5ML PO SOLN: 5.0000 mg | Freq: Once | ORAL | Status: AC | PRN

## 2022-06-29 MED ORDER — ACETAMINOPHEN 500 MG PO TABS
1000.0000 mg | ORAL_TABLET | ORAL | Status: AC
  Administered 2022-06-29: 1000 mg via ORAL
  Filled 2022-06-29: qty 2

## 2022-06-29 MED ORDER — SUCCINYLCHOLINE CHLORIDE 200 MG/10ML IV SOSY
PREFILLED_SYRINGE | INTRAVENOUS | Status: DC | PRN
  Administered 2022-06-29: 160 mg via INTRAVENOUS

## 2022-06-29 MED ORDER — CHLORHEXIDINE GLUCONATE CLOTH 2 % EX PADS: 6.0000 | MEDICATED_PAD | Freq: Once | CUTANEOUS | Status: DC

## 2022-06-29 MED ORDER — ROCURONIUM BROMIDE 10 MG/ML (PF) SYRINGE
PREFILLED_SYRINGE | INTRAVENOUS | Status: DC | PRN
  Administered 2022-06-29: 30 mg via INTRAVENOUS

## 2022-06-29 MED ORDER — FENTANYL CITRATE (PF) 100 MCG/2ML IJ SOLN: 25.0000 ug | INTRAMUSCULAR | Status: DC | PRN

## 2022-06-29 MED ORDER — OXYCODONE HCL 5 MG PO TABS
5.0000 mg | ORAL_TABLET | Freq: Four times a day (QID) | ORAL | 0 refills | Status: DC | PRN
Start: 2022-06-29 — End: 2023-02-28

## 2022-06-29 MED ORDER — PROMETHAZINE HCL 25 MG/ML IJ SOLN: 6.2500 mg | INTRAMUSCULAR | Status: DC | PRN

## 2022-06-29 MED ORDER — FENTANYL CITRATE (PF) 250 MCG/5ML IJ SOLN
INTRAMUSCULAR | Status: AC
  Filled 2022-06-29: qty 5

## 2022-06-29 MED ORDER — ACETAMINOPHEN 10 MG/ML IV SOLN: 1000.0000 mg | Freq: Once | INTRAVENOUS | Status: DC | PRN

## 2022-06-29 MED ORDER — MIDAZOLAM HCL 2 MG/2ML IJ SOLN
INTRAMUSCULAR | Status: DC | PRN
  Administered 2022-06-29: 2 mg via INTRAVENOUS

## 2022-06-29 MED ORDER — PROPOFOL 10 MG/ML IV BOLUS
INTRAVENOUS | Status: AC
  Filled 2022-06-29: qty 20

## 2022-06-29 MED ORDER — AMISULPRIDE (ANTIEMETIC) 5 MG/2ML IV SOLN: 10.0000 mg | Freq: Once | INTRAVENOUS | Status: DC | PRN

## 2022-06-29 MED ORDER — KETAMINE HCL 50 MG/5ML IJ SOSY
PREFILLED_SYRINGE | INTRAMUSCULAR | Status: AC
  Filled 2022-06-29: qty 5

## 2022-06-29 MED ORDER — DOXYCYCLINE HYCLATE 50 MG PO CAPS: 50.0000 mg | ORAL_CAPSULE | Freq: Two times a day (BID) | ORAL | 0 refills | Status: AC

## 2022-06-29 MED ORDER — ORAL CARE MOUTH RINSE: 15.0000 mL | Freq: Once | OROMUCOSAL | Status: AC

## 2022-06-29 MED ORDER — GABAPENTIN 300 MG PO CAPS
300.0000 mg | ORAL_CAPSULE | ORAL | Status: AC
  Administered 2022-06-29: 300 mg via ORAL
  Filled 2022-06-29: qty 1

## 2022-06-29 MED ORDER — DIBUCAINE (PERIANAL) 1 % EX OINT
TOPICAL_OINTMENT | CUTANEOUS | Status: AC
  Filled 2022-06-29: qty 28

## 2022-06-29 MED ORDER — OXYCODONE HCL 5 MG PO TABS
ORAL_TABLET | ORAL | Status: AC
  Filled 2022-06-29: qty 1

## 2022-06-29 MED ORDER — CHLORHEXIDINE GLUCONATE 0.12 % MT SOLN
15.0000 mL | Freq: Once | OROMUCOSAL | Status: AC
  Administered 2022-06-29: 15 mL via OROMUCOSAL
  Filled 2022-06-29: qty 15

## 2022-06-29 MED ORDER — ONDANSETRON HCL 4 MG/2ML IJ SOLN
INTRAMUSCULAR | Status: DC | PRN
  Administered 2022-06-29: 4 mg via INTRAVENOUS

## 2022-06-29 MED ORDER — INSULIN ASPART 100 UNIT/ML IJ SOLN
INTRAMUSCULAR | Status: AC
  Administered 2022-06-29: 2 [IU] via SUBCUTANEOUS
  Filled 2022-06-29: qty 1

## 2022-06-29 MED ORDER — CEFAZOLIN IN SODIUM CHLORIDE 3-0.9 GM/100ML-% IV SOLN
3.0000 g | INTRAVENOUS | Status: AC
  Administered 2022-06-29: 3 g via INTRAVENOUS
  Filled 2022-06-29: qty 100

## 2022-06-29 MED ORDER — INSULIN ASPART 100 UNIT/ML IJ SOLN: 0.0000 [IU] | INTRAMUSCULAR | Status: DC | PRN

## 2022-06-29 MED ORDER — KETAMINE HCL 10 MG/ML IJ SOLN
INTRAMUSCULAR | Status: DC | PRN
  Administered 2022-06-29: 30 mg via INTRAVENOUS

## 2022-06-29 MED ORDER — FENTANYL CITRATE (PF) 250 MCG/5ML IJ SOLN
INTRAMUSCULAR | Status: DC | PRN
  Administered 2022-06-29 (×2): 50 ug via INTRAVENOUS

## 2022-06-29 MED ORDER — LIDOCAINE 2% (20 MG/ML) 5 ML SYRINGE
INTRAMUSCULAR | Status: DC | PRN
  Administered 2022-06-29: 80 mg via INTRAVENOUS

## 2022-06-29 MED ORDER — ACETAMINOPHEN 160 MG/5ML PO SOLN: 325.0000 mg | ORAL | Status: DC | PRN

## 2022-06-29 MED ORDER — ACETAMINOPHEN 325 MG PO TABS: 325.0000 mg | ORAL_TABLET | ORAL | Status: DC | PRN

## 2022-06-29 SURGICAL SUPPLY — 40 items
BAG COUNTER SPONGE SURGICOUNT (BAG) ×1 IMPLANT
BAG SPNG CNTER NS LX DISP (BAG) ×1
CANISTER SUCT 3000ML PPV (MISCELLANEOUS) ×1 IMPLANT
COVER SURGICAL LIGHT HANDLE (MISCELLANEOUS) ×1 IMPLANT
DRAPE HALF SHEET 40X57 (DRAPES) ×1 IMPLANT
DRAPE UTILITY XL STRL (DRAPES) IMPLANT
ELECT CAUTERY BLADE 6.4 (BLADE) ×1 IMPLANT
ELECT REM PT RETURN 9FT ADLT (ELECTROSURGICAL) ×1
ELECTRODE REM PT RTRN 9FT ADLT (ELECTROSURGICAL) ×1 IMPLANT
GAUZE 4X4 16PLY ~~LOC~~+RFID DBL (SPONGE) ×1 IMPLANT
GAUZE PAD ABD 8X10 STRL (GAUZE/BANDAGES/DRESSINGS) ×1 IMPLANT
GAUZE SPONGE 4X4 12PLY STRL (GAUZE/BANDAGES/DRESSINGS) ×1 IMPLANT
GLOVE BIO SURGEON STRL SZ8 (GLOVE) ×1 IMPLANT
GLOVE BIOGEL PI IND STRL 8 (GLOVE) ×1 IMPLANT
GOWN STRL REUS W/ TWL LRG LVL3 (GOWN DISPOSABLE) ×2 IMPLANT
GOWN STRL REUS W/ TWL XL LVL3 (GOWN DISPOSABLE) ×1 IMPLANT
GOWN STRL REUS W/TWL LRG LVL3 (GOWN DISPOSABLE) ×2
GOWN STRL REUS W/TWL XL LVL3 (GOWN DISPOSABLE) ×1
KIT BASIN OR (CUSTOM PROCEDURE TRAY) ×1 IMPLANT
KIT SIGMOIDOSCOPE (SET/KITS/TRAYS/PACK) IMPLANT
KIT TURNOVER KIT B (KITS) ×1 IMPLANT
NDL HYPO 25GX1X1/2 BEV (NEEDLE) ×1 IMPLANT
NEEDLE HYPO 25GX1X1/2 BEV (NEEDLE) ×1 IMPLANT
NS IRRIG 1000ML POUR BTL (IV SOLUTION) ×1 IMPLANT
PACK LITHOTOMY IV (CUSTOM PROCEDURE TRAY) ×1 IMPLANT
PAD ARMBOARD 7.5X6 YLW CONV (MISCELLANEOUS) ×1 IMPLANT
PENCIL SMOKE EVACUATOR (MISCELLANEOUS) ×1 IMPLANT
SPECIMEN JAR SMALL (MISCELLANEOUS) IMPLANT
SPIKE FLUID TRANSFER (MISCELLANEOUS) ×1 IMPLANT
SPONGE SURGIFOAM ABS GEL 100 (HEMOSTASIS) IMPLANT
SURGILUBE 2OZ TUBE FLIPTOP (MISCELLANEOUS) ×1 IMPLANT
SUT CHROMIC 2 0 SH (SUTURE) IMPLANT
SUT MON AB 3-0 SH 27 (SUTURE)
SUT MON AB 3-0 SH27 (SUTURE) IMPLANT
SYR CONTROL 10ML LL (SYRINGE) ×1 IMPLANT
TOWEL GREEN STERILE (TOWEL DISPOSABLE) ×1 IMPLANT
TOWEL GREEN STERILE FF (TOWEL DISPOSABLE) ×1 IMPLANT
TUBE CONNECTING 12X1/4 (SUCTIONS) ×1 IMPLANT
UNDERPAD 30X36 HEAVY ABSORB (UNDERPADS AND DIAPERS) ×1 IMPLANT
YANKAUER SUCT BULB TIP NO VENT (SUCTIONS) ×1 IMPLANT

## 2022-06-29 NOTE — Transfer of Care (Signed)
Immediate Anesthesia Transfer of Care Note  Patient: [name] [name]  Procedure(s) Performed: CYST EXCISION PILONIDAL EXTENSIVE  Patient Location: PACU  Anesthesia Type:General  Level of Consciousness: drowsy  Airway & Oxygen Therapy: Patient Spontanous Breathing and Patient connected to face mask oxygen  Post-op Assessment: Report given to RN and Post -op Vital signs reviewed and stable  Post vital signs: Reviewed and stable  Last Vitals:  Vitals Value Taken Time  BP 163/111 06/29/22 1020  Temp    Pulse 86 06/29/22 1023  Resp 21 06/29/22 1023  SpO2 100 % 06/29/22 1023  Vitals shown include unvalidated device data.  Last Pain:  Vitals:   06/29/22 0737  TempSrc:   PainSc: 0-No pain      Patients Stated Pain Goal: 0 (06/29/22 0737)  Complications: No notable events documented.

## 2022-06-29 NOTE — Op Note (Signed)
Preoperative diagnosis: Pilonidal cyst complex  Postoperative diagnosis: Same  Procedure: Excision of complex pilonidal cyst with packing of wound due to present infection  Drains: None  Anesthesia: General with 0.25% Marcaine plain  IV fluids: Per anesthesia record  Assistant: Dr. Bernarda Caffey MD I was personally present during the key and critical portions of this procedure and immediately available throughout the entire procedure, as documented in my operative note.     Specimen: Pilonidal cyst  Description of procedure: The patient was met in the holding area and questions were answered.  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  He presents for excision today due to the complexity.  The procedure has been discussed with the patient.  Alternative therapies have been discussed with the patient.  Operative risks include bleeding,  Infection,  Organ injury,  Nerve injury,  Blood vessel injury,  DVT,  Pulmonary embolism,  Death,  And possible reoperation.  Medical management risks include worsening of present situation.  The success of the procedure is 50 -90 % at treating patients symptoms.  The patient understands and agrees to proceed.     Description of procedure: The patient was met in the holding area and questions were answered.  He was then taken back to the operating room.  He was intubated and was bed then placed prone jackknife with appropriate padding.  The gluteal cleft was then prepped and draped in sterile fashion.  Timeout performed.  Proper patient, site and procedure verified he received appropriate preoperative antibiotics.  He had 3 areas of disease.  The more superior was the most symptomatic where he had multiple infections.  This measured 1 x 2 cm.  There is a thin area down to the mid gluteal cleft just below this there was quite superficial third area distal to that which measures about 7 or 8 mm.  The more superior area was excised initially.   Dissection was carried down into the healthy subcutaneous fat.  Of note there was an abscess there and this was removed as well.  There is no cellulitis there.  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  We then excised the more inferior strip of skin involving multiple small sinus tracts.  These were not deep at all.  The third area was excised was quite superficial was well below that.  Due to the presence of infection, opted to pack the areas with saline soaked gauze.  Dry dressings were applied.  Hemostasis was achieved.  All counts found to be correct.  The patient was then placed back supine extubated.  He was then taken recovery in satisfactory condition.  All counts found to be correct.

## 2022-06-29 NOTE — H&P (Signed)
History of Present Illness: [name] [name] is a 42 y.o. male who is seen today as an office consultation for evaluation of New Consultation (Pilonidal cyst - c/o swelling and pain )  Patient presents for evaluation of pilonidal cyst. 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 He has significant inflammation and pain. Is complaining of pain today. He denies fever or chills. He says the area occasionally drains greenish fluid.   Review of Systems: A complete review of systems was obtained from the patient. I have reviewed this information and discussed as appropriate with the patient. See HPI as well for other ROS.    Medical History: Past Medical History:  Diagnosis Date  Arthritis  Diabetes mellitus without complication (CMS/HHS-HCC)  History of stroke  Hypertension   Patient Active Problem List  Diagnosis  Hypertension  Hypertriglyceridemia  Morbid obesity (CMS/HHS-HCC)  Tobacco use  Type 2 diabetes mellitus with hyperglycemia, without long-term current use of insulin (CMS/HHS-HCC)   Past Surgical History:  Procedure Laterality Date  FOOT SURGERY  KNEE SURGERY  SHOULDER SURGERY Left    Allergies  Allergen Reactions  Green Tea Anaphylaxis  Others Other (See Comments)  Uncoded Allergy. Allergen: TEA, Other Reaction: Not Assessed   Current Outpatient Medications on File Prior to Visit  Medication Sig Dispense Refill  amLODIPine (NORVASC) 5 MG tablet Take 5 mg by mouth once daily  lisinopriL-hydroCHLOROthiazide (ZESTORETIC) 20-25 mg tablet Take 1 tablet by mouth once daily  rosuvastatin (CRESTOR) 20 MG tablet Take 20 mg by mouth once daily   No current facility-administered medications on file prior to visit.   History reviewed. No pertinent family history.   Social History   Tobacco Use  Smoking Status Never  Smokeless Tobacco Never    Social History   Socioeconomic History  Marital status: Life Partner  Tobacco  Use  Smoking status: Never  Smokeless tobacco: Never  Substance and Sexual Activity  Alcohol use: Not Currently  Drug use: Never   Objective:   Vitals:  06/09/22 1000  BP: (!) 160/103  Pulse: 77  Temp: 36.9 C (98.5 F)  SpO2: 98%  Weight: (!) 185 kg (407 lb 12.8 oz)  Height: 193 cm (6\' 4" )  PainSc: 5  PainLoc: Rectum   Body mass index is 49.64 kg/m.  Physical Exam Constitutional:  Appearance: He is morbidly obese.  Eyes:  Pupils: Pupils are equal, round, and reactive to light.  Cardiovascular:  Rate and Rhythm: Normal rate.  Pulmonary:  Effort: Pulmonary effort is normal.  Abdominal:  Tenderness: There is no abdominal tenderness.  Musculoskeletal:  General: Normal range of motion.  Cervical back: Normal range of motion.  Skin:   Comments: Inflammation gluteal cleft. No fluctuance of the area is tender to touch with a small pilonidal tracts noted. No drainage.  Neurological:  General: No focal deficit present.  Mental Status: He is alert.  Psychiatric:  Mood and Affect: Mood normal.  Behavior: Behavior normal.     Labs, Imaging and Diagnostic Testing:  Latest Reference Range & Units 05/17/22 13:47  Sodium 135 - 145 mEq/L 140  Potassium 3.5 - 5.1 mEq/L 3.5  Chloride 96 - 112 mEq/L 102  CO2 19 - 32 mEq/L 28  Glucose 70 - 99 mg/dL 161 (H)  BUN 6 - 23 mg/dL 10  Creatinine 0.96 - 0.45 mg/dL 4.09  Calcium 8.4 - 81.1 mg/dL 9.5  Alkaline Phosphatase 39 - 117 U/L 80  Albumin 3.5 - 5.2 g/dL  4.3  AST 0 - 37 U/L 47 (H)  ALT 0 - 53 U/L 44  Total Protein 6.0 - 8.3 g/dL 7.0  Total Bilirubin 0.2 - 1.2 mg/dL 0.6  GFR >54.09 mL/min 90.05  (H): Data is abnormally high Assessment and Plan:   Diagnoses and all orders for this visit:  Pilonidal cyst - oxyCODONE (ROXICODONE) 5 MG immediate release tablet; Take 1 tablet (5 mg total) by mouth every 4 (four) hours as needed for up to 5 days  Other orders - doxycycline (MONODOX) 100 MG capsule; Take 1 capsule (100 mg  total) by mouth 2 (two) times daily for 10 days   Discussed options. Offered him attempted drainage today in the office. I do not feel much that is fluctuant but he is uncomfortable. I discussed the use of antibiotics as well. He does not want lanced today. I have offered him doxycycline on milligrams p.o. twice daily and is agreed. Plan will be to start antibiotics, I have also sent in medications for analgesia if he needs it. I told him if he worsens despite the medications he needs to return to the emergency room for potential drainage. In the meantime, we will schedule him for excision of his pilonidal cyst. This may need to be packed open depending on findings and discussed the potential of wound complications given his diabetes and morbid obesity. Certainly understands this but wishes to proceed since it has been quite bothersome to him for some time. Risk of bleeding, infection, poor wound healing, recurrence, open wound, slow healing, the need for the treatments procedures, complications from his underlying medical problems, exacerbation of my medical problems, injury to nerves, blood vessels, veins, neighboring structures.   Hayden Rasmussen, MD

## 2022-06-29 NOTE — Interval H&P Note (Signed)
History and Physical Interval Note:  06/29/2022 8:31 AM  [name] [name]  has presented today for surgery, with the diagnosis of PILONIDAL CYST COMPLEX.  The various methods of treatment have been discussed with the patient and family. After consideration of risks, benefits and other options for treatment, the patient has consented to  Procedure(s): CYST EXCISION PILONIDAL EXTENSIVE (N/A) as a surgical intervention.  The patient's history has been reviewed, patient examined, no change in status, stable for surgery.  I have reviewed the patient's chart and labs.  Questions were answered to the patient's satisfaction.     Bransyn Adami A Lilla Callejo

## 2022-06-29 NOTE — Anesthesia Postprocedure Evaluation (Signed)
Anesthesia Post Note  Patient: NESTER WIENCEK  Procedure(s) Performed: CYST EXCISION PILONIDAL EXTENSIVE     Patient location during evaluation: PACU Anesthesia Type: General Level of consciousness: awake and alert Pain management: pain level controlled Vital Signs Assessment: post-procedure vital signs reviewed and stable Respiratory status: spontaneous breathing, nonlabored ventilation, respiratory function stable and patient connected to nasal cannula oxygen Cardiovascular status: blood pressure returned to baseline and stable Postop Assessment: no apparent nausea or vomiting Anesthetic complications: no  No notable events documented.  Last Vitals:  Vitals:   06/29/22 1045 06/29/22 1100  BP: (!) 139/102 (!) 152/107  Pulse: 80 71  Resp: 11 17  Temp:  36.5 C  SpO2: 92% 94%    Last Pain:  Vitals:   06/29/22 0737  TempSrc:   PainSc: 0-No pain                 Shelton Silvas

## 2022-06-29 NOTE — Discharge Instructions (Signed)
Change packing once a day with wet to dry gauze and cover with dry gauze  Ok to shower  Can change it more frequently if you want but at least once a day  Take antibiotics as directed

## 2022-06-29 NOTE — Anesthesia Procedure Notes (Signed)
Procedure Name: Intubation Date/Time: 06/29/2022 9:23 AM  Performed by: Loleta Itai Barbian, CRNAPre-anesthesia Checklist: Patient identified, Patient being monitored, Timeout performed, Emergency Drugs available and Suction available Patient Re-evaluated:Patient Re-evaluated prior to induction Oxygen Delivery Method: Circle system utilized Preoxygenation: Pre-oxygenation with 100% oxygen Induction Type: IV induction and Rapid sequence Laryngoscope Size: Glidescope, 4 and Mac (LoPro S4) Grade View: Grade I Tube type: Oral Tube size: 8.0 mm Number of attempts: 1 Airway Equipment and Method: Stylet Placement Confirmation: ETT inserted through vocal cords under direct vision, positive ETCO2 and breath sounds checked- equal and bilateral Secured at: 24 cm Tube secured with: Tape Dental Injury: Teeth and Oropharynx as per pre-operative assessment

## 2022-06-30 ENCOUNTER — Encounter (HOSPITAL_COMMUNITY): Payer: Self-pay | Admitting: Surgery

## 2022-06-30 LAB — SURGICAL PATHOLOGY

## 2022-08-26 ENCOUNTER — Other Ambulatory Visit: Payer: Self-pay | Admitting: Family Medicine

## 2022-08-26 DIAGNOSIS — I10 Essential (primary) hypertension: Secondary | ICD-10-CM

## 2022-11-25 ENCOUNTER — Other Ambulatory Visit: Payer: Self-pay | Admitting: Family Medicine

## 2022-11-25 DIAGNOSIS — I10 Essential (primary) hypertension: Secondary | ICD-10-CM

## 2022-12-14 ENCOUNTER — Other Ambulatory Visit: Payer: Self-pay

## 2022-12-14 ENCOUNTER — Emergency Department (HOSPITAL_BASED_OUTPATIENT_CLINIC_OR_DEPARTMENT_OTHER): Payer: Commercial Managed Care - HMO

## 2022-12-14 ENCOUNTER — Emergency Department (HOSPITAL_BASED_OUTPATIENT_CLINIC_OR_DEPARTMENT_OTHER)
Admission: EM | Admit: 2022-12-14 | Discharge: 2022-12-14 | Disposition: A | Discharge status: Home / Self Care | Payer: Commercial Managed Care - HMO | Attending: Emergency Medicine | Admitting: Emergency Medicine

## 2022-12-14 ENCOUNTER — Encounter (HOSPITAL_BASED_OUTPATIENT_CLINIC_OR_DEPARTMENT_OTHER): Payer: Self-pay | Admitting: Emergency Medicine

## 2022-12-14 DIAGNOSIS — Z7984 Long term (current) use of oral hypoglycemic drugs: Secondary | ICD-10-CM | POA: Insufficient documentation

## 2022-12-14 DIAGNOSIS — I10 Essential (primary) hypertension: Secondary | ICD-10-CM | POA: Diagnosis not present

## 2022-12-14 DIAGNOSIS — E119 Type 2 diabetes mellitus without complications: Secondary | ICD-10-CM | POA: Diagnosis not present

## 2022-12-14 DIAGNOSIS — J45909 Unspecified asthma, uncomplicated: Secondary | ICD-10-CM | POA: Insufficient documentation

## 2022-12-14 DIAGNOSIS — R051 Acute cough: Secondary | ICD-10-CM | POA: Insufficient documentation

## 2022-12-14 DIAGNOSIS — R059 Cough, unspecified: Secondary | ICD-10-CM | POA: Diagnosis present

## 2022-12-14 DIAGNOSIS — Z79899 Other long term (current) drug therapy: Secondary | ICD-10-CM | POA: Diagnosis not present

## 2022-12-14 DIAGNOSIS — R0602 Shortness of breath: Secondary | ICD-10-CM | POA: Diagnosis not present

## 2022-12-14 DIAGNOSIS — Z1152 Encounter for screening for COVID-19: Secondary | ICD-10-CM | POA: Insufficient documentation

## 2022-12-14 LAB — CBC WITH DIFFERENTIAL/PLATELET
Abs Immature Granulocytes: 0.02 10*3/uL (ref 0.00–0.07)
Basophils Absolute: 0.1 10*3/uL (ref 0.0–0.1)
Basophils Relative: 1 %
Eosinophils Absolute: 0.3 10*3/uL (ref 0.0–0.5)
Eosinophils Relative: 3 %
HCT: 46 % (ref 39.0–52.0)
Hemoglobin: 15.7 g/dL (ref 13.0–17.0)
Immature Granulocytes: 0 %
Lymphocytes Relative: 33 %
Lymphs Abs: 3.4 10*3/uL (ref 0.7–4.0)
MCH: 29 pg (ref 26.0–34.0)
MCHC: 34.1 g/dL (ref 30.0–36.0)
MCV: 84.9 fL (ref 80.0–100.0)
Monocytes Absolute: 1.1 10*3/uL — ABNORMAL HIGH (ref 0.1–1.0)
Monocytes Relative: 10 %
Neutro Abs: 5.6 10*3/uL (ref 1.7–7.7)
Neutrophils Relative %: 53 %
Platelets: 224 10*3/uL (ref 150–400)
RBC: 5.42 MIL/uL (ref 4.22–5.81)
RDW: 13.4 % (ref 11.5–15.5)
WBC: 10.4 10*3/uL (ref 4.0–10.5)
nRBC: 0 % (ref 0.0–0.2)

## 2022-12-14 LAB — TROPONIN I (HIGH SENSITIVITY)
Troponin I (High Sensitivity): 6 ng/L (ref <18)
Troponin I (High Sensitivity): 6 ng/L (ref <18)

## 2022-12-14 LAB — BASIC METABOLIC PANEL
Anion gap: 12 (ref 5–15)
BUN: 12 mg/dL (ref 6–20)
CO2: 23 mmol/L (ref 22–32)
Calcium: 9.1 mg/dL (ref 8.9–10.3)
Chloride: 102 mmol/L (ref 98–111)
Creatinine, Ser: 1.04 mg/dL (ref 0.61–1.24)
GFR, Estimated: >60 mL/min (ref >60)
Glucose, Bld: 104 mg/dL — ABNORMAL HIGH (ref 70–99)
Potassium: 3.3 mmol/L — ABNORMAL LOW (ref 3.5–5.1)
Sodium: 137 mmol/L (ref 135–145)

## 2022-12-14 LAB — RESP PANEL BY RT-PCR (RSV, FLU A&B, COVID)  RVPGX2
Influenza A by PCR: NEGATIVE
Influenza B by PCR: NEGATIVE
Resp Syncytial Virus by PCR: NEGATIVE
SARS Coronavirus 2 by RT PCR: NEGATIVE

## 2022-12-14 LAB — BRAIN NATRIURETIC PEPTIDE: B Natriuretic Peptide: 20.7 pg/mL (ref 0.0–100.0)

## 2022-12-14 MED ORDER — PREDNISONE 20 MG PO TABS: 20.0000 mg | ORAL_TABLET | Freq: Every day | ORAL | 0 refills | Status: AC

## 2022-12-14 MED ORDER — IOHEXOL 350 MG/ML SOLN
100.0000 mL | Freq: Once | INTRAVENOUS | Status: AC | PRN
  Administered 2022-12-14: 100 mL via INTRAVENOUS

## 2022-12-14 MED ORDER — ALBUTEROL SULFATE HFA 108 (90 BASE) MCG/ACT IN AERS: 2.0000 | INHALATION_SPRAY | RESPIRATORY_TRACT | Status: DC | PRN

## 2022-12-14 MED ORDER — ALBUTEROL SULFATE HFA 108 (90 BASE) MCG/ACT IN AERS: 1.0000 | INHALATION_SPRAY | Freq: Four times a day (QID) | RESPIRATORY_TRACT | 0 refills | Status: AC | PRN

## 2022-12-14 NOTE — Discharge Instructions (Signed)
It was a pleasure taking care of your emergency department  Make sure to take the medication as prescribed, we are treating this as bronchitis  Make sure to follow-up outpatient, return for any worsening symptoms

## 2022-12-14 NOTE — ED Provider Notes (Signed)
[facility]PARTMENT AT [facility] Provider Note   CSN: [id number] Arrival date & time: 12/14/22  1628    History  Chief Complaint  Patient presents with   Cough    [name] [name] is a 42 y.o. male with hx of DM, tobacco use, hypertension here for evaluation of shortness of breath.  Began earlier today.  Stated he was coughing like he was trying to get "mucus" up.  He initially developed some diffuse chest tightness across his anterior chest was subsequently resolved.  He continued to be short of breath.  Feels like he cannot take a deep breath.  He has history of asthma as a child however nothing recently.  No new lower extremity swelling, has chronic changes to left leg from prior remote surgery.  No redness or warmth.  No numbness or weakness.  No back pain, jaw pain, arm pain, abdominal pain.  He is able to do his ADLs typically.  Did state that his wife's been sick for the last few days.  He denies any fever, nausea, vomiting.  Cough productive of clear sputum.  He denies any PND orthopnea.  History of sleep apnea does not wear CPAP. No meds PTA.  No history of PE or DVT.  No recent surgery, immobilization or malignancy  HPI     Home Medications Prior to Admission medications   Medication Sig Start Date End Date Taking? Authorizing Provider  albuterol (VENTOLIN HFA) 108 (90 Base) MCG/ACT inhaler Inhale 1-2 puffs into the lungs every 6 (six) hours as needed for wheezing or shortness of breath. 12/14/22  Yes Saahil Herbster A, PA-C  predniSONE (DELTASONE) 20 MG tablet Take 1 tablet (20 mg total) by mouth daily for 5 days. 12/14/22 12/19/22 Yes Teah Votaw A, PA-C  amLODipine (NORVASC) 5 MG tablet Take 1 tablet (5 mg total) by mouth daily. 05/17/22   Clayborne Dana, NP  blood glucose meter kit and supplies KIT Dispense based on patient and insurance preference. Use up to four times daily as directed. Please include lancets, test strips, control solution.  02/16/22   Clayborne Dana, NP  doxycycline (MONODOX) 100 MG capsule Take 100 mg by mouth 2 (two) times daily. 06/09/22   [provider]  Dulaglutide (TRULICITY) 1.5 MG/0.5ML SOPN Inject 1.5 mg into the skin once a week. 05/17/22   Clayborne Dana, NP  fexofenadine (ALLEGRA) 180 MG tablet Take 180 mg by mouth daily as needed for allergies or rhinitis.    [provider]  ibuprofen (ADVIL) 800 MG tablet Take 1 tablet (800 mg total) by mouth every 8 (eight) hours as needed. 06/29/22   Cornett, Maisie Fus, MD  lisinopril-hydrochlorothiazide (ZESTORETIC) 20-12.5 MG tablet Take 1 tablet by mouth daily. 11/27/22   Clayborne Dana, NP  oxyCODONE (OXY IR/ROXICODONE) 5 MG immediate release tablet Take 1 tablet (5 mg total) by mouth every 6 (six) hours as needed for severe pain. 06/29/22   Cornett, Maisie Fus, MD  rosuvastatin (CRESTOR) 20 MG tablet Take 1 tablet (20 mg total) by mouth daily. 05/17/22   Clayborne Dana, NP      Allergies    Tea    Review of Systems   Review of Systems  Constitutional: Negative.   HENT: Negative.    Respiratory:  Positive for cough, chest tightness and shortness of breath. Negative for choking, wheezing and stridor.   Cardiovascular: Negative.   Gastrointestinal: Negative.   Genitourinary: Negative.   Musculoskeletal: Negative.   Skin: Negative.  Neurological: Negative.   All other systems reviewed and are negative.   Physical Exam Updated Vital Signs BP (!) 139/94 (BP Location: Left Arm)   Pulse 75   Temp 98.3 F (36.8 C) (Oral)   Resp (!) 22   Wt (!) 167.8 kg   SpO2 100%   BMI 45.04 kg/m  Physical Exam Vitals and nursing note reviewed.  Constitutional:      General: He is not in acute distress.    Appearance: He is well-developed. He is obese. He is not ill-appearing, toxic-appearing or diaphoretic.  HENT:     Head: Normocephalic and atraumatic.     Mouth/Throat:     Mouth: Mucous membranes are moist.  Eyes:     Pupils: Pupils are equal, round,  and reactive to light.  Cardiovascular:     Rate and Rhythm: Normal rate and regular rhythm.     Pulses: Normal pulses.  Pulmonary:     Effort: Pulmonary effort is normal. No respiratory distress.     Breath sounds: Normal breath sounds.     Comments: Clear bilaterally, speaks in full sentences without difficulty Abdominal:     General: Bowel sounds are normal. There is no distension.     Palpations: Abdomen is soft.     Tenderness: There is no abdominal tenderness. There is no right CVA tenderness, left CVA tenderness, guarding or rebound.     Comments: Soft, nontender  Musculoskeletal:        General: Normal range of motion.     Cervical back: Normal range of motion and neck supple.     Comments: No bony tenderness, compartments soft.  Skin changes to left lower extremity consistent with remote surgery, no erythema, warmth, incisions clean, dry, intact  Skin:    General: Skin is warm and dry.     Capillary Refill: Capillary refill takes less than 2 seconds.  Neurological:     General: No focal deficit present.     Mental Status: He is alert and oriented to person, place, and time.     ED Results / Procedures / Treatments   Labs (all labs ordered are listed, but only abnormal results are displayed) Labs Reviewed  CBC WITH DIFFERENTIAL/PLATELET - Abnormal; Notable for the following components:      Result Value   Monocytes Absolute 1.1 (*)    All other components within normal limits  BASIC METABOLIC PANEL - Abnormal; Notable for the following components:   Potassium 3.3 (*)    Glucose, Bld 104 (*)    All other components within normal limits  RESP PANEL BY RT-PCR (RSV, FLU A&B, COVID)  [id number]  BRAIN NATRIURETIC PEPTIDE  TROPONIN I (HIGH SENSITIVITY)  TROPONIN I (HIGH SENSITIVITY)    EKG EKG Interpretation Date/Time:  Thursday December 14 2022 16:36:52 EDT Ventricular Rate:  85 PR Interval:  150 QRS Duration:  76 QT Interval:  372 QTC Calculation: 442 R  Axis:   48  Text Interpretation: Sinus rhythm with Fusion complexes Lateral infarct , age undetermined ST & T wave abnormality, consider inferior ischemia Abnormal ECG When compared with ECG of 07-Feb-2022 20:58, No significant change since last tracing Confirmed by Meridee Score [phone]) on 12/14/2022 4:41:32 PM  Radiology CT Angio Chest PE W and/or Wo Contrast  Result Date: 12/14/2022 CLINICAL DATA:  Shortness of breath and cough for 2 days. Unable to speak in full sentences. Chest pain earlier. EXAM: CT ANGIOGRAPHY CHEST WITH CONTRAST TECHNIQUE: Multidetector CT imaging of the chest was performed using  the standard protocol during bolus administration of intravenous contrast. Multiplanar CT image reconstructions and MIPs were obtained to evaluate the vascular anatomy. RADIATION DOSE REDUCTION: This exam was performed according to the departmental dose-optimization program which includes automated exposure control, adjustment of the mA and/or kV according to patient size and/or use of iterative reconstruction technique. CONTRAST:  OMNIPAQUE IOHEXOL 350 MG/ML SOLN COMPARISON:  Chest radiograph earlier today and CTA chest 05/09/2008 FINDINGS: Cardiovascular: Negative for acute pulmonary embolism. Normal caliber thoracic aorta. No pericardial effusion. Mediastinum/Nodes: Trachea and esophagus are unremarkable. No thoracic adenopathy Lungs/Pleura: No focal consolidation, pleural effusion, or pneumothorax. Upper Abdomen: No acute abnormality. Musculoskeletal: No acute fracture. Review of the MIP images confirms the above findings. IMPRESSION: Negative for acute pulmonary embolism. No acute abnormality in the chest. Electronically Signed   By: Minerva Fester M.D.   On: 12/14/2022 19:07   DG Chest Port 1 View  Result Date: 12/14/2022 CLINICAL DATA:  Chest pain. EXAM: PORTABLE CHEST 1 VIEW COMPARISON:  Chest radiograph dated 02/07/2022. FINDINGS: No focal consolidation, pleural effusion, or  pneumothorax. Top-normal cardiac size. No acute osseous pathology. IMPRESSION: No active disease. Electronically Signed   By: Elgie Collard M.D.   On: 12/14/2022 18:20    Procedures Procedures    Medications Ordered in ED Medications  albuterol (VENTOLIN HFA) 108 (90 Base) MCG/ACT inhaler 2 puff (has no administration in time range)  iohexol (OMNIPAQUE) 350 MG/ML injection 100 mL (100 mLs Intravenous Contrast Given 12/14/22 1739)   ED Course/ Medical Decision Making/ A&P   27 old here for evaluation of shortness of breath.  He has had cough productive of clear sputum.  No clinical evidence of VTE on exam.  He does not appear grossly fluid overloaded.  He denies any prior history of CHF.  Did state significant other has been sick.  No fever.  His heart and lungs are clear comes abdomen soft, nontender.  No back pain, abdominal pain.  Respiratory therapy saw patient on arrival, has clear lung sounds.  Will plan on labs, imaging and reassess  Labs and imaging personally viewed and interpreted:  CBC without leukocytosis Metabolic panel potassium 3.3 Troponin 6 Respiratory panel neg BNP 20.7 EKG without ischemic changes Chest x-ray CTA neg for acute process  Patient reassessed.  He is ambulatory without any hypoxia.  Low suspicion for acute ACS, PE, dissection, bacterial pneumonia, pneumothorax, unstable angina, AAA, fluid overload. Will treat symptomatically, have him follow-up outpatient, return for any worsening symptoms.  The patient has been appropriately medically screened and/or stabilized in the ED. I have low suspicion for any other emergent medical condition which would require further screening, evaluation or treatment in the ED or require inpatient management.  Patient is hemodynamically stable and in no acute distress.  Patient able to ambulate in department prior to ED.  Evaluation does not show acute pathology that would require ongoing or additional emergent interventions  while in the emergency department or further inpatient treatment.  I have discussed the diagnosis with the patient and answered all questions.  Pain is been managed while in the emergency department and patient has no further complaints prior to discharge.  Patient is comfortable with plan discussed in room and is stable for discharge at this time.  I have discussed strict return precautions for returning to the emergency department.  Patient was encouraged to follow-up with PCP/specialist refer to at discharge.  Medical Decision Making Amount and/or Complexity of Data Reviewed Independent Historian: spouse External Data Reviewed: labs, radiology, ECG and notes. Labs: ordered. Decision-making details documented in ED Course. Radiology: ordered and independent interpretation performed. Decision-making details documented in ED Course. ECG/medicine tests: ordered and independent interpretation performed. Decision-making details documented in ED Course.  Risk OTC drugs. Prescription drug management. Parenteral controlled substances. Decision regarding hospitalization. Diagnosis or treatment significantly limited by social determinants of health.          Final Clinical Impression(s) / ED Diagnoses Final diagnoses:  Acute cough  SOB (shortness of breath)    Rx / DC Orders ED Discharge Orders          Ordered    predniSONE (DELTASONE) 20 MG tablet  Daily        12/14/22 2000    albuterol (VENTOLIN HFA) 108 (90 Base) MCG/ACT inhaler  Every 6 hours PRN        12/14/22 2000              Habib Kise A, PA-C 12/14/22 2000    Terrilee Files, MD 12/15/22 1000

## 2022-12-14 NOTE — ED Triage Notes (Signed)
Shortness of breath today , cough x 2 days , unable to speak full sentence . Had chest pain earlier he said , resolved prior to arrival .  Diaphoretic .

## 2022-12-25 ENCOUNTER — Ambulatory Visit: Payer: Commercial Managed Care - HMO | Admitting: Family Medicine

## 2022-12-25 DIAGNOSIS — E1165 Type 2 diabetes mellitus with hyperglycemia: Secondary | ICD-10-CM

## 2023-01-19 ENCOUNTER — Telehealth: Payer: Self-pay | Admitting: Family Medicine

## 2023-01-19 DIAGNOSIS — E1165 Type 2 diabetes mellitus with hyperglycemia: Secondary | ICD-10-CM

## 2023-01-19 DIAGNOSIS — I10 Essential (primary) hypertension: Secondary | ICD-10-CM

## 2023-01-19 MED ORDER — LISINOPRIL-HYDROCHLOROTHIAZIDE 20-12.5 MG PO TABS: 1.0000 | ORAL_TABLET | Freq: Every day | ORAL | 0 refills | Status: DC

## 2023-01-19 MED ORDER — TRULICITY 1.5 MG/0.5ML ~~LOC~~ SOAJ: 1.5000 mg | SUBCUTANEOUS | 0 refills | Status: DC

## 2023-01-19 MED ORDER — FEXOFENADINE HCL 180 MG PO TABS: 180.0000 mg | ORAL_TABLET | Freq: Every day | ORAL | 0 refills | Status: DC

## 2023-01-19 MED ORDER — ROSUVASTATIN CALCIUM 20 MG PO TABS: 20.0000 mg | ORAL_TABLET | Freq: Every day | ORAL | 0 refills | Status: DC

## 2023-01-19 MED ORDER — AMLODIPINE BESYLATE 5 MG PO TABS: 5.0000 mg | ORAL_TABLET | Freq: Every day | ORAL | 0 refills | Status: DC

## 2023-01-19 NOTE — Telephone Encounter (Signed)
RXs already sent

## 2023-01-19 NOTE — Telephone Encounter (Signed)
Patient out of town and out of medications. Wanted RXs sent to [facility] so we can transfer. He is aware overdue for follow up. 30 day supply sent.

## 2023-01-19 NOTE — Telephone Encounter (Signed)
Pt left for vacation and accidentally left all of his prescriptions in [place]. Jade made aware.    amLODipine (NORVASC) 5 MG tablet  Dulaglutide (TRULICITY) 1.5 MG/0.5ML SOPN  lisinopril-hydrochlorothiazide (ZESTORETIC) 20-12.5 MG tablet  rosuvastatin (CRESTOR) 20 MG tablet    [facility] [address] [place] [address]

## 2023-01-26 ENCOUNTER — Telehealth: Payer: Self-pay | Admitting: Family Medicine

## 2023-01-26 NOTE — Telephone Encounter (Signed)
I did received paper from pharmacy that this requires PA. I faxed authorization paperwork to [facility].

## 2023-01-26 NOTE — Telephone Encounter (Signed)
Did he call the pharmacy? This was sent in for a month on 01/18/2023. He really needs to call the pharmacy and see what the issue could be. He is also overdue for visit.

## 2023-01-26 NOTE — Telephone Encounter (Signed)
Called pt and advised him ppw was being sent to his pharmacy to authorize filling the medication.

## 2023-01-26 NOTE — Telephone Encounter (Signed)
Pt called stating that he is unable to pick up his trulicity from the pharmacy. Per 4.5.24 phone note, medication was approved from 3.22.24 to 4.5.25. Advised a note would be sent back to investigate and get back with him. Pt stated he is currently out of the medication and has been for a few days. Routed HP due to pt being out of medication.

## 2023-02-15 ENCOUNTER — Ambulatory Visit: Payer: Commercial Managed Care - HMO | Admitting: Family Medicine

## 2023-02-15 ENCOUNTER — Encounter: Payer: Self-pay | Admitting: Family Medicine

## 2023-02-15 VITALS — BP 136/69 | HR 88 | Ht 76.0 in | Wt >= 6400 oz

## 2023-02-15 DIAGNOSIS — Z7985 Long-term (current) use of injectable non-insulin antidiabetic drugs: Secondary | ICD-10-CM | POA: Diagnosis not present

## 2023-02-15 DIAGNOSIS — I10 Essential (primary) hypertension: Secondary | ICD-10-CM

## 2023-02-15 DIAGNOSIS — E1165 Type 2 diabetes mellitus with hyperglycemia: Secondary | ICD-10-CM | POA: Diagnosis not present

## 2023-02-15 DIAGNOSIS — M25562 Pain in left knee: Secondary | ICD-10-CM

## 2023-02-15 DIAGNOSIS — G8929 Other chronic pain: Secondary | ICD-10-CM

## 2023-02-15 DIAGNOSIS — E781 Pure hyperglyceridemia: Secondary | ICD-10-CM

## 2023-02-15 DIAGNOSIS — M25561 Pain in right knee: Secondary | ICD-10-CM | POA: Diagnosis not present

## 2023-02-15 DIAGNOSIS — R0981 Nasal congestion: Secondary | ICD-10-CM

## 2023-02-15 LAB — MICROALBUMIN / CREATININE URINE RATIO
Creatinine,U: 118.7 mg/dL
Microalb Creat Ratio: 1 mg/g (ref 0.0–30.0)
Microalb, Ur: 1.2 mg/dL (ref 0.0–1.9)

## 2023-02-15 MED ORDER — FLUTICASONE PROPIONATE 50 MCG/ACT NA SUSP: 2.0000 | Freq: Every day | NASAL | 6 refills | Status: DC

## 2023-02-15 MED ORDER — GUAIFENESIN ER 600 MG PO TB12: 1200.0000 mg | ORAL_TABLET | Freq: Two times a day (BID) | ORAL | 0 refills | Status: DC | PRN

## 2023-02-15 MED ORDER — MELOXICAM 15 MG PO TABS: 15.0000 mg | ORAL_TABLET | Freq: Every day | ORAL | 0 refills | Status: DC

## 2023-02-15 NOTE — Assessment & Plan Note (Signed)
 On Trulicity 1.5mg  with good blood sugar control at home. A1c to be checked today. -Check A1c today. -Consider increasing Trulicity dose based on A1c results. -Attempt to prescribe 34-month supply of Trulicity

## 2023-02-15 NOTE — Assessment & Plan Note (Signed)
Recheck cholesterol/triglycerides today. Continue rosuvastatin Lifestyle factors for lowering cholesterol include: Diet therapy - heart-healthy diet rich in fruits, veggies, fiber-rich whole grains, lean meats, chicken, fish (at least twice a week), fat-free or 1% dairy products; foods low in saturated/trans fats, cholesterol, sodium, and sugar. Mediterranean diet has shown to be very heart healthy. Regular exercise - recommend at least 30 minutes a day, 5 times per week Weight management

## 2023-02-15 NOTE — Progress Notes (Signed)
 Established Patient Office Visit  Subjective   Patient ID: [name] [name], male    DOB: 23-May-1980  Age: 43 y.o. MRN: [id number]  Chief Complaint  Patient presents with   Medical Management of Chronic Issues    HPI  Patient is here for routine follow-up. Reports he has been doing well overall, but his knee pain has been flaring up.   Discussed the use of AI scribe software for clinical note transcription with the patient, who gave verbal consent to proceed.  History of Present Illness   The patient, with a history of hypertension, diabetes, and hyperlipidemia, presents for a regular six-month follow-up. They report good control of their blood pressure and blood sugar levels at home. They are currently on amlodipine  5mg , lisinopril -hydrochlorothiazide  20-12.5mg  once daily, and Trulicity  1.5 for diabetes management. They deny any symptoms.   The patient's blood sugars have been mostly within the target range. They have been experiencing difficulty in obtaining a regular supply of Trulicity  and request a three-month supply.  The patient also reports flare of chronic knee pain, which they attribute to weather changes. They describe the pain as fluctuating, with some days being worse than others. They have a history of multiple sports injuries, including two metal rods in left lower leg, and several screws in the ankle from a football injury. They also have plate/screws in their arm from a radius injury. They have been using occasional ibuprofen  and Voltaren  gel for pain management, but report that these only provide relief when they are not moving.  The patient also reports recent upper respiratory infection symptoms, including persistent mucus production and coughing, despite not feeling ill. They request medication for mucus management.         Hypertension: - Medications: amlodipine  5 mg daily, lisinopril -hctz 20-12.5 mg daily - Compliance: good - Checking BP at home: rarely -  Denies any SOB, recurrent headaches, CP, vision changes, LE edema, dizziness, palpitations, or medication side effects. - Diet: low sodium, low carb - Exercise: walking   Diabetes: - Checking BG at home: 90-120s - Medications: Tulicity 1.5 mg/week, rosuvastatin  20 mg daily - Compliance: good - Diet: low solidum, low carb - Exercise: walking - Eye exam: referral placed today  - Foot exam: today - Microalbumin: 03/02/22, normal  - Denies symptoms of hypoglycemia, polyuria, polydipsia, numbness extremities, foot ulcers/trauma, wounds that are not healing, medication side effects  Lab Results  Component Value Date   HGBA1C 6.6 (H) 05/17/2022    Hyperlipidemia/Hypertriglyceridemia/CVA 2012: - medications: rosuvastatin  20 mg daily,  - compliance: good - medication SEs: none The ASCVD Risk score (Arnett DK, et al., 2019) failed to calculate for the following reasons:   Risk score cannot be calculated because patient has a medical history suggesting prior/existing ASCVD      ROS All review of systems negative except what is listed in the HPI    Objective:     BP 136/69   Pulse 88   Ht 6' 4 (1.93 m)   Wt (!) 413 lb (187.3 kg)   SpO2 98%   BMI 50.27 kg/m    Physical Exam Vitals reviewed.  Constitutional:      Appearance: Normal appearance. He is obese.  HENT:     Head: Normocephalic and atraumatic.  Cardiovascular:     Rate and Rhythm: Normal rate and regular rhythm.  Pulmonary:     Effort: Pulmonary effort is normal.     Breath sounds: Normal breath sounds.  Musculoskeletal:  General: Normal range of motion.  Skin:    General: Skin is warm and dry.  Neurological:     Mental Status: He is alert.  Psychiatric:        Mood and Affect: Mood normal.        Behavior: Behavior normal.        Thought Content: Thought content normal.        Judgment: Judgment normal.      Results for orders placed or performed in visit on 02/15/23  Microalbumin /  creatinine urine ratio  Result Value Ref Range   Microalb, Ur 1.2 0.0 - 1.9 mg/dL   Creatinine,U 881.2 mg/dL   Microalb Creat Ratio 1.0 0.0 - 30.0 mg/g      The ASCVD Risk score (Arnett DK, et al., 2019) failed to calculate for the following reasons:   Risk score cannot be calculated because patient has a medical history suggesting prior/existing ASCVD    Assessment & Plan:   Problem List Items Addressed This Visit       Active Problems   Hypertension (Chronic)   Well controlled on Amlodipine  5mg  and Lisinopril -Hydrochlorothiazide  20-12.5mg  daily. No symptoms of chest pain or headaches. -Continue current medications. -Continue lifestyle measures      Type 2 diabetes mellitus with hyperglycemia, without long-term current use of insulin  (HCC) - Primary (Chronic)   On Trulicity  1.5mg  with good blood sugar control at home. A1c to be checked today. -Check A1c today. -Consider increasing Trulicity  dose based on A1c results. -Attempt to prescribe 60-month supply of Trulicity       Relevant Orders   Hemoglobin A1c   Comprehensive metabolic panel   Microalbumin / creatinine urine ratio (Completed)   Hypertriglyceridemia (Chronic)   Recheck cholesterol/triglycerides today. Continue rosuvastatin  Lifestyle factors for lowering cholesterol include: Diet therapy - heart-healthy diet rich in fruits, veggies, fiber-rich whole grains, lean meats, chicken, fish (at least twice a week), fat-free or 1% dairy products; foods low in saturated/trans fats, cholesterol, sodium, and sugar. Mediterranean diet has shown to be very heart healthy. Regular exercise - recommend at least 30 minutes a day, 5 times per week Weight management       Other Visit Diagnoses       Chronic pain of both knees     Experiencing increased pain in knees.  -Start Meloxicam  for long-acting anti-inflammatory effect (do not take with other NSAIDs) -Continue Voltaren  gel. -Referral to Orthopedics for further  management. Deferred imaging today.    Relevant Medications   meloxicam  (MOBIC ) 15 MG tablet   Other Relevant Orders   Ambulatory referral to Orthopedic Surgery     Nasal congestion     Residual mucus production following recent upper respiratory infection. -Trial of Flonase  nasal spray and Mucinex  as needed. -Consider use of humidifier and increased fluid intake.   Relevant Medications   fluticasone  (FLONASE ) 50 MCG/ACT nasal spray   guaiFENesin  (MUCINEX ) 600 MG 12 hr tablet       Return in about 6 months (around 08/15/2023) for routine follow-up.    Waddell KATHEE Mon, NP

## 2023-02-15 NOTE — Assessment & Plan Note (Signed)
 Well controlled on Amlodipine 5mg  and Lisinopril-Hydrochlorothiazide 20-12.5mg  daily. No symptoms of chest pain or headaches. -Continue current medications. -Continue lifestyle measures

## 2023-02-16 LAB — COMPREHENSIVE METABOLIC PANEL
ALT: 40 U/L (ref 0–53)
AST: 40 U/L — ABNORMAL HIGH (ref 0–37)
Albumin: 4.5 g/dL (ref 3.5–5.2)
Alkaline Phosphatase: 88 U/L (ref 39–117)
BUN: 9 mg/dL (ref 6–23)
CO2: 30 meq/L (ref 19–32)
Calcium: 9.4 mg/dL (ref 8.4–10.5)
Chloride: 100 meq/L (ref 96–112)
Creatinine, Ser: 1.01 mg/dL (ref 0.40–1.50)
GFR: 90.64 mL/min (ref >60.00)
Glucose, Bld: 115 mg/dL — ABNORMAL HIGH (ref 70–99)
Potassium: 3.4 meq/L — ABNORMAL LOW (ref 3.5–5.1)
Sodium: 140 meq/L (ref 135–145)
Total Bilirubin: 0.5 mg/dL (ref 0.2–1.2)
Total Protein: 7.1 g/dL (ref 6.0–8.3)

## 2023-02-16 LAB — HEMOGLOBIN A1C: Hgb A1c MFr Bld: 6.8 % — ABNORMAL HIGH (ref 4.6–6.5)

## 2023-02-19 ENCOUNTER — Telehealth: Payer: Self-pay

## 2023-02-19 DIAGNOSIS — E1165 Type 2 diabetes mellitus with hyperglycemia: Secondary | ICD-10-CM

## 2023-02-19 MED ORDER — TRULICITY 3 MG/0.5ML ~~LOC~~ SOAJ: 3.0000 mg | SUBCUTANEOUS | 1 refills | Status: DC

## 2023-02-19 NOTE — Telephone Encounter (Signed)
 PA initiated via Covermymeds; KEY: [id number]. Awaiting determination.

## 2023-02-19 NOTE — Addendum Note (Signed)
 Addended by: Hyman Hopes B on: 02/19/2023 03:28 PM   Modules accepted: Orders

## 2023-02-20 ENCOUNTER — Encounter: Payer: Self-pay | Admitting: Family Medicine

## 2023-02-20 MED ORDER — METFORMIN HCL 500 MG PO TABS: 500.0000 mg | ORAL_TABLET | Freq: Two times a day (BID) | ORAL | 3 refills | Status: DC

## 2023-02-20 NOTE — Telephone Encounter (Signed)
 PA denied.   There is nothing to support that the individual meets one of the following (i, ii, iii, or iv): i.  Patient has tried one metformin -containing product for a minimum of 8 weeks; ii. Intolerance  to metformin  despite appropriate dose titrationduration (for example, period of 8 weeks); iii.  Contraindication to metformin  (for example, acute/chronic metabolic acidosis, severe renal  dysfunction); iv. Patient has established atherosclerotic CVD (ASCVD) or indicators of high  ASCVD risk, or chronic kidney disease (CKD). ? Bydureon BCise, Byetta, and Trulicity  are considered medically necessary for the following  FDA-Approved Indication - Type 2 Diabetes Mellitus. Approve for 1 year if the patient meets  both of the following (A and B): A. Diagnosis of Type 2 diabetes mellitus [Documentation  Required]; B. One of the following (i, ii, iii, or iv): i. Patient has tried one metformincontaining product for a minimum of 8 weeks; ii. Intolerance to metformin  despite  [facility] on behalf of [address] [employer] appropriate dose titration duration (for example, period of 8 weeks); iii. Contraindication to  metformin  (for example, acute/chronic metabolic acidosis, severe renal dysfunction); iv.  Patient has established atherosclerotic CVD (ASCVD) or indicators of high ASCVD risk, or  chronic kidney disease. When coverage is available and medically necessary, the dosage,  frequency, duration of therapy, and site of care should be reasonable, clinically appropriate,  and supported by evidence-based literature and adjusted based upon severity, alternative  available treatments, and previous response to therapy. Receipt of sample product does  not satisfy any criteria requirements for coverage. Any other use is considered  experimental, investigational, or unproven, including the following (this list may not be all  inclusive; criteria will be updated as new  published data are available): 1. Weight Loss  Treatment. 2. Type 1 Diabetes Mellitus. 3. Prediabetes/Diabetes Prevention. 4. Metabolic  Syndrome. 5. Concomitant Use with Glucagon-Like Peptide-1 (GLP-1) Agonists or GLP-1/  Glucose-Dependent Insulinotropic Polypeptide (GIP) Agonist. Documentation is required for  use of Bydureon BCise, Byetta, and Trulicity  as noted in the criteria as [documentation  required]. Documentation may include, but is not limited to, chart notes, laboratory results,  claims records, and/or other information.

## 2023-02-20 NOTE — Telephone Encounter (Signed)
 Patient confirmed he has never tried Metformin. Aware insurance is requiring this before they will cover trulicity. He will let us know of any problems with medications.

## 2023-02-21 ENCOUNTER — Emergency Department (HOSPITAL_COMMUNITY): Payer: Commercial Managed Care - HMO

## 2023-02-21 ENCOUNTER — Emergency Department (HOSPITAL_COMMUNITY)
Admission: EM | Admit: 2023-02-21 | Discharge: 2023-02-21 | Disposition: A | Discharge status: Home / Self Care | Payer: Commercial Managed Care - HMO | Attending: Emergency Medicine | Admitting: Emergency Medicine

## 2023-02-21 ENCOUNTER — Other Ambulatory Visit: Payer: Self-pay

## 2023-02-21 ENCOUNTER — Encounter (HOSPITAL_COMMUNITY): Payer: Self-pay

## 2023-02-21 DIAGNOSIS — N3000 Acute cystitis without hematuria: Secondary | ICD-10-CM | POA: Insufficient documentation

## 2023-02-21 DIAGNOSIS — W228XXA Striking against or struck by other objects, initial encounter: Secondary | ICD-10-CM | POA: Diagnosis not present

## 2023-02-21 DIAGNOSIS — E876 Hypokalemia: Secondary | ICD-10-CM | POA: Insufficient documentation

## 2023-02-21 DIAGNOSIS — Z79899 Other long term (current) drug therapy: Secondary | ICD-10-CM | POA: Diagnosis not present

## 2023-02-21 DIAGNOSIS — R0600 Dyspnea, unspecified: Secondary | ICD-10-CM | POA: Insufficient documentation

## 2023-02-21 DIAGNOSIS — Z794 Long term (current) use of insulin: Secondary | ICD-10-CM | POA: Insufficient documentation

## 2023-02-21 DIAGNOSIS — R55 Syncope and collapse: Secondary | ICD-10-CM | POA: Diagnosis present

## 2023-02-21 DIAGNOSIS — Z7984 Long term (current) use of oral hypoglycemic drugs: Secondary | ICD-10-CM | POA: Diagnosis not present

## 2023-02-21 DIAGNOSIS — I10 Essential (primary) hypertension: Secondary | ICD-10-CM | POA: Diagnosis not present

## 2023-02-21 DIAGNOSIS — R079 Chest pain, unspecified: Secondary | ICD-10-CM | POA: Insufficient documentation

## 2023-02-21 DIAGNOSIS — Y990 Civilian activity done for income or pay: Secondary | ICD-10-CM | POA: Diagnosis not present

## 2023-02-21 DIAGNOSIS — E119 Type 2 diabetes mellitus without complications: Secondary | ICD-10-CM | POA: Insufficient documentation

## 2023-02-21 LAB — CBC
HCT: 47.9 % (ref 39.0–52.0)
Hemoglobin: 16.7 g/dL (ref 13.0–17.0)
MCH: 29.8 pg (ref 26.0–34.0)
MCHC: 34.9 g/dL (ref 30.0–36.0)
MCV: 85.4 fL (ref 80.0–100.0)
Platelets: 247 10*3/uL (ref 150–400)
RBC: 5.61 MIL/uL (ref 4.22–5.81)
RDW: 13.5 % (ref 11.5–15.5)
WBC: 10.7 10*3/uL — ABNORMAL HIGH (ref 4.0–10.5)
nRBC: 0 % (ref 0.0–0.2)

## 2023-02-21 LAB — BASIC METABOLIC PANEL
Anion gap: 13 (ref 5–15)
BUN: 10 mg/dL (ref 6–20)
CO2: 23 mmol/L (ref 22–32)
Calcium: 9.7 mg/dL (ref 8.9–10.3)
Chloride: 102 mmol/L (ref 98–111)
Creatinine, Ser: 0.99 mg/dL (ref 0.61–1.24)
GFR, Estimated: >60 mL/min (ref >60)
Glucose, Bld: 117 mg/dL — ABNORMAL HIGH (ref 70–99)
Potassium: 3 mmol/L — ABNORMAL LOW (ref 3.5–5.1)
Sodium: 138 mmol/L (ref 135–145)

## 2023-02-21 LAB — TROPONIN I (HIGH SENSITIVITY)
Troponin I (High Sensitivity): 7 ng/L (ref <18)
Troponin I (High Sensitivity): 9 ng/L (ref <18)

## 2023-02-21 LAB — URINALYSIS, ROUTINE W REFLEX MICROSCOPIC
Bacteria, UA: NONE SEEN
Bilirubin Urine: NEGATIVE
Glucose, UA: NEGATIVE mg/dL
Hgb urine dipstick: NEGATIVE
Ketones, ur: 5 mg/dL — AB
Nitrite: NEGATIVE
Protein, ur: 100 mg/dL — AB
Specific Gravity, Urine: 1.025 (ref 1.005–1.030)
WBC, UA: >50 WBC/hpf (ref 0–5)
pH: 6 (ref 5.0–8.0)

## 2023-02-21 LAB — CBG MONITORING, ED: Glucose-Capillary: 115 mg/dL — ABNORMAL HIGH (ref 70–99)

## 2023-02-21 LAB — BRAIN NATRIURETIC PEPTIDE: B Natriuretic Peptide: 18.2 pg/mL (ref 0.0–100.0)

## 2023-02-21 MED ORDER — SODIUM CHLORIDE 0.9 % IV SOLN: 1.0000 g | Freq: Once | INTRAVENOUS | Status: DC

## 2023-02-21 MED ORDER — IOHEXOL 350 MG/ML SOLN
75.0000 mL | Freq: Once | INTRAVENOUS | Status: AC | PRN
  Administered 2023-02-21: 75 mL via INTRAVENOUS

## 2023-02-21 MED ORDER — CEPHALEXIN 500 MG PO CAPS
500.0000 mg | ORAL_CAPSULE | Freq: Once | ORAL | Status: AC
  Administered 2023-02-21: 500 mg via ORAL
  Filled 2023-02-21: qty 1

## 2023-02-21 MED ORDER — POTASSIUM CHLORIDE CRYS ER 20 MEQ PO TBCR
40.0000 meq | EXTENDED_RELEASE_TABLET | Freq: Once | ORAL | Status: AC
  Administered 2023-02-21: 40 meq via ORAL
  Filled 2023-02-21: qty 2

## 2023-02-21 MED ORDER — POTASSIUM CHLORIDE CRYS ER 20 MEQ PO TBCR: 20.0000 meq | EXTENDED_RELEASE_TABLET | Freq: Two times a day (BID) | ORAL | 0 refills | Status: DC

## 2023-02-21 MED ORDER — CEPHALEXIN 500 MG PO CAPS: 500.0000 mg | ORAL_CAPSULE | Freq: Four times a day (QID) | ORAL | 0 refills | Status: DC

## 2023-02-21 MED ORDER — HYDROMORPHONE HCL 1 MG/ML IJ SOLN
1.0000 mg | Freq: Once | INTRAMUSCULAR | Status: AC
  Administered 2023-02-21: 1 mg via INTRAVENOUS
  Filled 2023-02-21: qty 1

## 2023-02-21 NOTE — Discharge Instructions (Addendum)
 1.  Schedule a follow-up appointment with your doctor in the next 2 to 4 days. 2.  CT scan of the chest did not show any blood clot in the lungs or other immediate abnormality.  Your heart labs and EKG did not show signs of a heart attack.  However, if you experience concerning symptoms again such as shortness of breath, chest pain, lightheadedness, return to the emergency department immediately for recheck. 3.  Your potassium was low.  Take potassium twice daily for the next week and follow-up with your doctor for recheck on your numbers. 4.  Your urine has many white blood cells in it which MAY be an infection. Urinary infections can come from several different sources including your own body by transmission of your own fecal bacteria or infections transmitted sexually.  Results will not be ready for several days.  You may follow-up on results in your MyChart and with your doctor. You are being treated with antibiotics that WOULD NOT treat sexually transmitted infection as you have no symptoms of this. It is very important that you follow up with your doctor for monitoring of the urine in response to treatment.

## 2023-02-21 NOTE — ED Notes (Signed)
CBG: 115 

## 2023-02-21 NOTE — ED Provider Triage Note (Addendum)
 Emergency Medicine Provider Triage Evaluation Note  [name] [name] , a 43 y.o. male  was evaluated in triage.  Pt complains of CP with SOB causing syncopal episode. Witnessed by boss but unknown time down. He landed on face.   Currently complaining of SOB, HA, neck pain. 97% RA. No tachypnea No obvious injury to head, neck. Midline TTP.  Review of Systems  Positive: CP. SOB. Syncope Negative: fevers  Physical Exam  BP (!) 170/109 (BP Location: Right Arm)   Pulse 78   Temp 97.7 F (36.5 C) (Oral)   Resp 18   Ht 6' 4 (1.93 m)   Wt (!) 187.3 kg   SpO2 100%   BMI 50.26 kg/m  Gen:   Awake, no distress  A&Ox3 Resp:  Normal effort  MSK:   Moves extremities without difficulty  Other:    Medical Decision Making  Medically screening exam initiated at 1:29 PM.  Appropriate orders placed.  Reyes LITTIE Kenner was informed that the remainder of the evaluation will be completed by another provider, this initial triage assessment does not replace that evaluation, and the importance of remaining in the ED until their evaluation is complete.  Labs, cxr, ct ordered   Minnie Tinnie BRAVO, PA 02/21/23 1332    Minnie Tinnie BRAVO, PA 02/21/23 1336

## 2023-02-21 NOTE — ED Provider Notes (Signed)
 [place] EMERGENCY DEPARTMENT AT [facility] Provider Note   CSN: [id number] Arrival date & time: 02/21/23  1308     History  Chief Complaint  Patient presents with   Loss of Consciousness   Chest Pain    [name] [name] is a 43 y.o. male.  HPI Reports he felt fine when he awakened this morning.  He went to work.  At about 10 he started to feel like he had trouble catching his breath.  He does moderately active physical labor.  He reports it was no more difficult than usual.  He reports he was also getting some vague chest discomfort.  He thought he could just work through it.  He was talking to his coworker and all of a sudden had a passing out episode falling forward and striking his head on the right side of the face.  Patient reports he did not even really realize it had happened.  He awakened and had normal mental status.  He denies any confusion.  He did have a headache after striking his face from falling.  He reports that the shortness of breath and vague chest discomfort persisted somewhat but did not get any worse or change.  Patient denies has been ill recently.  He has not had any fevers or chills.  He has not had cough.  Patient has some chronic swelling of the left lower extremity from old surgical repair of lower extremity fracture.  He reports has not been any different.  He has not noticed any increase in calf pain.  No personal history of DVT or PE.  Patient reports he is compliant with his medications.  He reports his blood pressure is usually pretty well-controlled on his medications.  He does not typically have uncontrolled or significantly elevated blood pressure.  Patient denies any family history of early coronary artery disease or sudden death.    Home Medications Prior to Admission medications   Medication Sig Start Date End Date Taking? Authorizing Provider  cephALEXin  (KEFLEX ) 500 MG capsule Take 1 capsule (500 mg total) by mouth 4 (four) times  daily. 02/21/23  Yes Armenta Canning, MD  potassium chloride  SA (KLOR-CON  M) 20 MEQ tablet Take 1 tablet (20 mEq total) by mouth 2 (two) times daily. 02/21/23  Yes Armenta Canning, MD  albuterol  (VENTOLIN  HFA) 108 (90 Base) MCG/ACT inhaler Inhale 1-2 puffs into the lungs every 6 (six) hours as needed for wheezing or shortness of breath. 12/14/22   Henderly, Britni A, PA-C  amLODipine  (NORVASC ) 5 MG tablet Take 1 tablet (5 mg total) by mouth daily. 01/19/23   Almarie Waddell NOVAK, NP  blood glucose meter kit and supplies KIT Dispense based on patient and insurance preference. Use up to four times daily as directed. Please include lancets, test strips, control solution. 02/16/22   Almarie Waddell NOVAK, NP  Dulaglutide  (TRULICITY ) 3 MG/0.5ML SOAJ Inject 3 mg into the skin once a week. 02/19/23   Almarie Waddell NOVAK, NP  fexofenadine  (ALLEGRA ) 180 MG tablet Take 1 tablet (180 mg total) by mouth daily. 01/19/23   Almarie Waddell NOVAK, NP  fluticasone  (FLONASE ) 50 MCG/ACT nasal spray Place 2 sprays into both nostrils daily. 02/15/23   Almarie Waddell NOVAK, NP  guaiFENesin  (MUCINEX ) 600 MG 12 hr tablet Take 2 tablets (1,200 mg total) by mouth 2 (two) times daily as needed. 02/15/23   Almarie Waddell NOVAK, NP  ibuprofen  (ADVIL ) 800 MG tablet Take 1 tablet (800 mg total) by mouth every 8 (  eight) hours as needed. 06/29/22   Cornett, Debby, MD  lisinopril -hydrochlorothiazide  (ZESTORETIC ) 20-12.5 MG tablet Take 1 tablet by mouth daily. 01/19/23   Almarie Waddell NOVAK, NP  meloxicam  (MOBIC ) 15 MG tablet Take 1 tablet (15 mg total) by mouth daily. 02/15/23   Almarie Waddell NOVAK, NP  metFORMIN  (GLUCOPHAGE ) 500 MG tablet Take 1 tablet (500 mg total) by mouth 2 (two) times daily with a meal. 02/20/23   Almarie Waddell NOVAK, NP  oxyCODONE  (OXY IR/ROXICODONE ) 5 MG immediate release tablet Take 1 tablet (5 mg total) by mouth every 6 (six) hours as needed for severe pain. 06/29/22   Cornett, Debby, MD  rosuvastatin  (CRESTOR ) 20 MG tablet Take 1 tablet (20 mg total) by mouth daily. 01/19/23    Almarie Waddell NOVAK, NP      Allergies    Tea    Review of Systems   Review of Systems  Physical Exam Updated Vital Signs BP (!) 143/89   Pulse 65   Temp 97.6 F (36.4 C) (Oral)   Resp 18   Ht 6' 4 (1.93 m)   Wt (!) 187.3 kg   SpO2 97%   BMI 50.26 kg/m  Physical Exam Constitutional:      Comments: Well-nourished well-developed.  No distress.  No respiratory distress.  HENT:     Head:     Comments: No visible facial swelling or contusion.  Patient has tenderness and discomfort to the right forehead and zygomatic area.    Mouth/Throat:     Pharynx: Oropharynx is clear.  Eyes:     Extraocular Movements: Extraocular movements intact.  Cardiovascular:     Rate and Rhythm: Normal rate and regular rhythm.  Pulmonary:     Effort: Pulmonary effort is normal.     Breath sounds: Normal breath sounds.  Abdominal:     General: There is no distension.     Palpations: Abdomen is soft.     Tenderness: There is no abdominal tenderness. There is no guarding.  Musculoskeletal:        General: Normal range of motion.     Cervical back: Neck supple.     Comments: Well-healed surgical scars on the left lower leg.  Slight surrounding edema.  Calf soft and pliable.  No edema right lower extremity soft pliable calf.  Skin:    General: Skin is warm and dry.  Neurological:     General: No focal deficit present.     Mental Status: He is oriented to person, place, and time.     Cranial Nerves: No cranial nerve deficit.     Motor: No weakness.     Coordination: Coordination normal.  Psychiatric:        Mood and Affect: Mood normal.     ED Results / Procedures / Treatments   Labs (all labs ordered are listed, but only abnormal results are displayed) Labs Reviewed  BASIC METABOLIC PANEL - Abnormal; Notable for the following components:      Result Value   Potassium 3.0 (*)    Glucose, Bld 117 (*)    All other components within normal limits  CBC - Abnormal; Notable for the following  components:   WBC 10.7 (*)    All other components within normal limits  URINALYSIS, ROUTINE W REFLEX MICROSCOPIC - Abnormal; Notable for the following components:   Color, Urine AMBER (*)    APPearance HAZY (*)    Ketones, ur 5 (*)    Protein, ur 100 (*)  Leukocytes,Ua SMALL (*)    All other components within normal limits  CBG MONITORING, ED - Abnormal; Notable for the following components:   Glucose-Capillary 115 (*)    All other components within normal limits  URINE CULTURE  BRAIN NATRIURETIC PEPTIDE  RPR  HIV ANTIBODY (ROUTINE TESTING W REFLEX)  GC/CHLAMYDIA PROBE AMP ([facility]) NOT AT [facility]  TROPONIN I (HIGH SENSITIVITY)  TROPONIN I (HIGH SENSITIVITY)    EKG EKG Interpretation Date/Time:  Wednesday February 21 2023 13:23:27 EST Ventricular Rate:  76 PR Interval:  159 QRS Duration:  92 QT Interval:  387 QTC Calculation: 436 R Axis:   34  Text Interpretation: Sinus rhythm no sig change from previous, no acute ischemic appearance Confirmed by Armenta Canning [phone]) on 02/21/2023 6:07:08 PM  Radiology CT Angio Chest PE W/Cm &/Or Wo Cm Result Date: 02/21/2023 CLINICAL DATA:  Pulmonary embolism (PE) suspected, high prob. Chest pain, syncope. Shortness of breath. EXAM: CT ANGIOGRAPHY CHEST WITH CONTRAST TECHNIQUE: Multidetector CT imaging of the chest was performed using the standard protocol during bolus administration of intravenous contrast. Multiplanar CT image reconstructions and MIPs were obtained to evaluate the vascular anatomy. RADIATION DOSE REDUCTION: This exam was performed according to the departmental dose-optimization program which includes automated exposure control, adjustment of the mA and/or kV according to patient size and/or use of iterative reconstruction technique. CONTRAST:  75mL OMNIPAQUE  IOHEXOL  350 MG/ML SOLN COMPARISON:  12/14/2022 FINDINGS: Cardiovascular: No filling defects in the pulmonary arteries to suggest pulmonary emboli. Heart is normal size.  Aorta is normal caliber. Mediastinum/Nodes: No mediastinal, hilar, or axillary adenopathy. Trachea and esophagus are unremarkable. Thyroid unremarkable. Lungs/Pleura: Lungs are clear. No focal airspace opacities or suspicious nodules. No effusions. Upper Abdomen: No acute findings Musculoskeletal: Chest wall soft tissues are unremarkable. No acute bony abnormality. Review of the MIP images confirms the above findings. IMPRESSION: No evidence of pulmonary embolus. No acute cardiopulmonary disease. Electronically Signed   By: Franky Crease M.D.   On: 02/21/2023 20:34   DG Chest Portable 1 View Result Date: 02/21/2023 CLINICAL DATA:  Chest pain and syncopal episode today. EXAM: PORTABLE CHEST 1 VIEW COMPARISON:  12/14/2022 FINDINGS: Normal sized heart. Tortuous aorta. Stable mild chronic peribronchial thickening. Clear lungs with normal vascularity. Mild thoracic spine degenerative changes. IMPRESSION: No acute abnormality.  Stable mild chronic bronchitic changes. Electronically Signed   By: Elspeth Bathe M.D.   On: 02/21/2023 15:13   CT Head Wo Contrast Result Date: 02/21/2023 CLINICAL DATA:  Syncopal episode, hit his head EXAM: CT HEAD WITHOUT CONTRAST CT CERVICAL SPINE WITHOUT CONTRAST TECHNIQUE: Multidetector CT imaging of the head and cervical spine was performed following the standard protocol without intravenous contrast. Multiplanar CT image reconstructions of the cervical spine were also generated. RADIATION DOSE REDUCTION: This exam was performed according to the departmental dose-optimization program which includes automated exposure control, adjustment of the mA and/or kV according to patient size and/or use of iterative reconstruction technique. COMPARISON:  08/22/2016 CT head, no prior CT cervical spine FINDINGS: CT HEAD FINDINGS Brain: No evidence of acute infarct, hemorrhage, mass, mass effect, or midline shift. No hydrocephalus or extra-axial fluid collection. Vascular: No hyperdense vessel. Skull:  Negative for fracture or focal lesion. Sinuses/Orbits: No acute finding. Other: The mastoid air cells are well aerated. CT CERVICAL SPINE FINDINGS Evaluation is somewhat limited by body habitus. Alignment: No traumatic listhesis. Straightening of the normal cervical lordosis. Mild dextrocurvature may be positional. Skull base and vertebrae: No acute fracture or suspicious osseous lesion. Soft  tissues and spinal canal: No prevertebral fluid or swelling. No visible canal hematoma. Disc levels: Degenerative changes in the cervical spine.No high-grade spinal canal stenosis. Upper chest: No focal pulmonary opacity or pleural effusion. IMPRESSION: 1. No acute intracranial process. 2. No acute fracture or traumatic listhesis in the cervical spine. Electronically Signed   By: Donald Campion M.D.   On: 02/21/2023 14:14   CT Cervical Spine Wo Contrast Result Date: 02/21/2023 CLINICAL DATA:  Syncopal episode, hit his head EXAM: CT HEAD WITHOUT CONTRAST CT CERVICAL SPINE WITHOUT CONTRAST TECHNIQUE: Multidetector CT imaging of the head and cervical spine was performed following the standard protocol without intravenous contrast. Multiplanar CT image reconstructions of the cervical spine were also generated. RADIATION DOSE REDUCTION: This exam was performed according to the departmental dose-optimization program which includes automated exposure control, adjustment of the mA and/or kV according to patient size and/or use of iterative reconstruction technique. COMPARISON:  08/22/2016 CT head, no prior CT cervical spine FINDINGS: CT HEAD FINDINGS Brain: No evidence of acute infarct, hemorrhage, mass, mass effect, or midline shift. No hydrocephalus or extra-axial fluid collection. Vascular: No hyperdense vessel. Skull: Negative for fracture or focal lesion. Sinuses/Orbits: No acute finding. Other: The mastoid air cells are well aerated. CT CERVICAL SPINE FINDINGS Evaluation is somewhat limited by body habitus. Alignment: No  traumatic listhesis. Straightening of the normal cervical lordosis. Mild dextrocurvature may be positional. Skull base and vertebrae: No acute fracture or suspicious osseous lesion. Soft tissues and spinal canal: No prevertebral fluid or swelling. No visible canal hematoma. Disc levels: Degenerative changes in the cervical spine.No high-grade spinal canal stenosis. Upper chest: No focal pulmonary opacity or pleural effusion. IMPRESSION: 1. No acute intracranial process. 2. No acute fracture or traumatic listhesis in the cervical spine. Electronically Signed   By: Donald Campion M.D.   On: 02/21/2023 14:14    Procedures Procedures    Medications Ordered in ED Medications  HYDROmorphone  (DILAUDID ) injection 1 mg (1 mg Intravenous Given 02/21/23 1846)  iohexol  (OMNIPAQUE ) 350 MG/ML injection 75 mL (75 mLs Intravenous Contrast Given 02/21/23 1914)  potassium chloride  SA (KLOR-CON  M) CR tablet 40 mEq (40 mEq Oral Given 02/21/23 2120)  cephALEXin  (KEFLEX ) capsule 500 mg (500 mg Oral Given 02/21/23 2119)    ED Course/ Medical Decision Making/ A&P                                 Medical Decision Making Amount and/or Complexity of Data Reviewed Labs: ordered. Radiology: ordered.  Risk Prescription drug management.   Patient presents as outlined.  He has known history of hypertension and diabetes.  Patient had a syncopal episode today with preceding symptoms of dyspnea and some vague chest discomfort.  Will proceed with broad diagnostic evaluation for ACS\PE\pneumonia\pneumothorax\electrolyte derangement.  At time of evaluation patient is asymptomatic.  Basic metabolic panel normal except potassium 3.0 white count 10.7 H&H 16 and 47 BNP 18 troponin 7 and flat.  Urinalysis greater than 50 WBCs 5 ketones.  EKG interpreted by myself no acute ischemic appearance.  With EKG without ischemia and 2 sets of troponin is negative at this time I have low suspicion for ACS\MI as precipitating factor for syncope.   Still concern for PE with predominant symptom being dyspnea for several hours before this event.  Will proceed with PE study.  PE study visually reviewed by myself and interpreted by radiology, no PE present.  Aorta normal caliber. PE  ruled and acute MI ruled out.  Low suspicion for dissection with combination of no acute findings on CT and no typical symptoms of severe central chest pain or radiation.  Patient's initial intake blood pressure is elevated at 170/109 but normalized over time without intervention to 143/89.  No signs of hypertensive emergency.  BNP normal and no vascular congestion on chest x-ray.  The potassium is moderately low at 3.0 however at this time does not appear likely to precipitated shortness of breath and syncope.  Will replace orally and have reviewed necessity to follow-up with PCP for monitoring and recheck.  Patient's urinalysis is positive for greater than 50 white cells.  He does not have symptoms of dysuria or penile drainage.  In light of this acute event and abnormal urine we will proceed with urine culture and empiric STI testing.  Will opt to treat with Keflex  for UTI and defer any STI treatment based on testing.  He is discharged in stable condition with normal heart rate and was resolved.  We have reviewed immediate return precautions and follow-up plan.  Patient voices understanding.          Final Clinical Impression(s) / ED Diagnoses Final diagnoses:  Syncope, unspecified syncope type  Dyspnea, unspecified type  Chest pain, unspecified type  Hypokalemia  Acute cystitis without hematuria    Rx / DC Orders ED Discharge Orders          Ordered    cephALEXin  (KEFLEX ) 500 MG capsule  4 times daily        02/21/23 2056    potassium chloride  SA (KLOR-CON  M) 20 MEQ tablet  2 times daily        02/21/23 2056              Armenta Canning, MD 02/21/23 2135

## 2023-02-21 NOTE — ED Triage Notes (Signed)
 Pt had chest pain and then had syncopal episode today at work,  hit his head. LOC lasted a minute. Pt states he feels sob No blood thinner, some neck pain BS 127, NSR with EMS

## 2023-02-22 LAB — HIV ANTIBODY (ROUTINE TESTING W REFLEX): HIV Screen 4th Generation wRfx: NONREACTIVE

## 2023-02-22 LAB — URINE CULTURE: Culture: NO GROWTH

## 2023-02-22 LAB — RPR: RPR Ser Ql: NONREACTIVE

## 2023-02-23 LAB — GC/CHLAMYDIA PROBE AMP (~~LOC~~) NOT AT ARMC
Chlamydia: NEGATIVE
Comment: NEGATIVE
Comment: NORMAL
Neisseria Gonorrhea: NEGATIVE

## 2023-02-27 ENCOUNTER — Ambulatory Visit: Payer: Self-pay | Admitting: Family Medicine

## 2023-02-27 ENCOUNTER — Telehealth: Payer: Self-pay | Admitting: Neurology

## 2023-02-27 DIAGNOSIS — G8929 Other chronic pain: Secondary | ICD-10-CM

## 2023-02-27 DIAGNOSIS — E1165 Type 2 diabetes mellitus with hyperglycemia: Secondary | ICD-10-CM

## 2023-02-27 MED ORDER — TRULICITY 3 MG/0.5ML ~~LOC~~ SOAJ: 3.0000 mg | SUBCUTANEOUS | 1 refills | Status: DC

## 2023-02-27 NOTE — Telephone Encounter (Signed)
  Chief Complaint: N/V, diarrhea Symptoms: N/V, weight loss, diarrhea Frequency: Ongoing since last Thursday Pertinent Negatives: Patient denies fever Disposition: [] ED /[] Urgent Care (no appt availability in office) / [] Appointment(In office/virtual)/ []  [facility]/ [] Home Care/ [] Refused Recommended Disposition /[] [facility] [place] [facility]/ [x]  Follow-up with PCP Additional Notes: Pt reports he started new prescription Metformin  last Thursday due to insurance wanting him to try this prior to covering Trulicity . Pt reports since beginning this medication last Thursday he has lost approx 20 pounds and has been experiencing vomiting and diarrhea consistently. Pt states he has been able to drink sufficient amounts of water but he has vomited at least 30 times, my throat is irritated and sore. Pt reports HA, denies fever. Pt reports he would like to stop the Metformin  and return to the Trulicity  as he does not feel he can tolerate the Metformin  any longer. Pt is requesting PCP recommendations and a new script for the Trulicity  sent to pharmacy on file, pt reports he took his last dose of Trulicity  last week. This RN educated pt on home care, new-worsening symptoms, when to call back/seek emergent care. Pt verbalized understanding and agrees to plan. Forwarding to provider for review.   Copied from CRM [id number]. Topic: Clinical - Red Word Triage >> Feb 27, 2023 10:22 AM Leila BROCKS wrote: Red Word that prompted transfer to Nurse Triage: Patient [phone] states Metformin  is making him sick, throwing up, cannot eat, headaches, diarrhea. Patient wants to to speak the nurse. Reason for Disposition  [1] Pharmacy calling with prescription question AND [2] triager unable to answer question  Answer Assessment - Initial Assessment Questions 1. NAME of MEDICINE: What medicine(s) are you calling about?     Metformin  once daily, started last Thursday 2. QUESTION: What is your question? (e.g.,  double dose of medicine, side effect)      3. PRESCRIBER: Who prescribed the medicine? Reason: if prescribed by specialist, call should be referred to that group.      4. SYMPTOMS: Do you have any symptoms? If Yes, ask: What symptoms are you having?  How bad are the symptoms (e.g., mild, moderate, severe)     Weight loss 20 pounds since last Thursday, diarrhea, vomiting, HA  Protocols used: Medication Question Call-A-AH

## 2023-02-27 NOTE — Telephone Encounter (Signed)
 Patient made aware to stop Metformin.   He also states Meloxicam not helping with his knee/back pain. He is out of oxycodone. Did you want to renew or send something else?

## 2023-02-27 NOTE — Addendum Note (Signed)
 Addended bySilvio Pate on: 02/27/2023 03:39 PM   Modules accepted: Orders

## 2023-02-27 NOTE — Telephone Encounter (Signed)
 PA started for Trulicity on patient. [name] [name] (Key: [id number]). Waiting on review from [place].

## 2023-02-27 NOTE — Telephone Encounter (Signed)
 Ask him to stop the metformin and see if symptoms resolve - if severe vomiting/diarrhea persists, he needs to be evaluated. Cancel metformin script and let's retry Trulicity on last dose we sent since he has only missed one week - if we can get it approved

## 2023-02-28 MED ORDER — OXYCODONE HCL 5 MG PO TABS: 5.0000 mg | ORAL_TABLET | Freq: Four times a day (QID) | ORAL | 0 refills | Status: DC | PRN

## 2023-02-28 NOTE — Telephone Encounter (Signed)
 One refill of pain meds provided - he has not had this filled in awhile. If he ends up needing more, he will need to see Ortho.

## 2023-02-28 NOTE — Telephone Encounter (Signed)
 Patient made aware. He has follow up ortho at the end of the month.

## 2023-02-28 NOTE — Addendum Note (Signed)
 Addended by: Minna Amass B on: 02/28/2023 07:52 AM   Modules accepted: Orders

## 2023-03-02 NOTE — Telephone Encounter (Signed)
Approved on January 14 by [facility];Review Type:Prior Auth;Coverage Start Date:02/27/2023;Coverage End Date:02/27/2024

## 2023-03-08 ENCOUNTER — Telehealth: Payer: Self-pay | Admitting: Neurology

## 2023-03-08 NOTE — Telephone Encounter (Signed)
  Patient made aware RX sent for 90 day already. He expressed understanding that insurance may require 30 day fills.    Copied from CRM [id number]. Topic: Clinical - Prescription Issue >> Mar 08, 2023  2:08 PM [name] [name] wrote: Reason for CRM: Patient is requesting that his Dulaglutide (TRULICITY) 3 MG/0.5ML SOAJ Rx be changed to a 90 day supply due to it being hard to get sometimes and having had to go to other states previously to get his refill.

## 2023-03-09 ENCOUNTER — Encounter: Payer: Self-pay | Admitting: Surgical

## 2023-03-09 ENCOUNTER — Other Ambulatory Visit (INDEPENDENT_AMBULATORY_CARE_PROVIDER_SITE_OTHER): Payer: Self-pay

## 2023-03-09 ENCOUNTER — Ambulatory Visit (INDEPENDENT_AMBULATORY_CARE_PROVIDER_SITE_OTHER): Payer: Commercial Managed Care - HMO | Admitting: Surgical

## 2023-03-09 DIAGNOSIS — M25562 Pain in left knee: Secondary | ICD-10-CM

## 2023-03-09 DIAGNOSIS — M25561 Pain in right knee: Secondary | ICD-10-CM | POA: Diagnosis not present

## 2023-03-09 DIAGNOSIS — G8929 Other chronic pain: Secondary | ICD-10-CM

## 2023-03-09 DIAGNOSIS — M1712 Unilateral primary osteoarthritis, left knee: Secondary | ICD-10-CM

## 2023-03-09 MED ORDER — METHYLPREDNISOLONE ACETATE 40 MG/ML IJ SUSP
40.0000 mg | INTRAMUSCULAR | Status: AC | PRN
  Administered 2023-03-09: 40 mg via INTRA_ARTICULAR

## 2023-03-09 MED ORDER — LIDOCAINE HCL 1 % IJ SOLN
5.0000 mL | INTRAMUSCULAR | Status: AC | PRN
  Administered 2023-03-09: 5 mL

## 2023-03-09 MED ORDER — MELOXICAM 15 MG PO TABS: 15.0000 mg | ORAL_TABLET | Freq: Every day | ORAL | 0 refills | Status: DC | PRN

## 2023-03-09 MED ORDER — BUPIVACAINE HCL 0.25 % IJ SOLN
4.0000 mL | INTRAMUSCULAR | Status: AC | PRN
  Administered 2023-03-09: 4 mL via INTRA_ARTICULAR

## 2023-03-09 NOTE — Progress Notes (Signed)
Office Visit Note   Patient: [name] [name]           Date of Birth: [birth year]           MRN: [id number] Visit Date: 03/09/2023 Requested by: Clayborne Dana, NP [address] [place],  [place] [address] PCP: Clayborne Dana, NP  Subjective: Chief Complaint  Patient presents with   Right Knee - Pain   Left Knee - Pain    HPI: [name] [name] is a 43 y.o. male who presents to the office reporting left knee pain.  Patient reports a history of multiple sports injuries from playing football.  He sustained a tib-fib fracture requiring intramedullary nail in 2005 that was done at [facility].  He also had skin injury to that area that required skin grafting.  He has had multiple procedures to his left leg with ankle surgery as well.  He describes chronic left knee pain that is primarily localized to the anterior aspect of the knee without radiation.  He does have occasional clicking/popping sensation but no locking symptoms.  Describes achy sensation that is worse with cold weather and does have stiffness with immobility.  Has occasional low back pain but no sciatica.  No groin pain.  Has had prior injections that sound like intramuscular steroid injections.  Previously he had this about 2 years ago that gave him 6 months of relief of his knee pain.  Aside from this he will use the occasional BC powder.  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    Does report no significant medical history aside from type 2 diabetes with last A1c 6.8.  He denies any CKD, heart disease.  No current blood thinner use but he does report remote history of blood clot that required short duration of blood thinner that he is no longer on.              ROS: All systems reviewed are negative as they relate to the chief complaint within the history of present illness.  Patient denies fevers or chills.  Assessment & Plan: Visit Diagnoses:  1.  Arthritis of left knee   2. Chronic pain of left knee   3. Chronic pain of both knees     Plan: Patient is a 43 year old male who presents for evaluation of left knee pain.  Has chronic pain in his left knee with history of tib-fib fracture treated with intramedullary nail in 2005.  He has radiographs taken today demonstrating mild degenerative changes of the patellofemoral compartment with no significant degenerative changes noted of the medial or lateral compartments.  Does have medial/lateral joint line tenderness however which may signify meniscal pathology or occult arthritis.  We discussed options available to patient and he would like to try injection into the left knee.  He tolerated this well without complication.  We will plan to have him follow-up with Dr. August Saucer in 2 months for clinical recheck and consideration of left knee MRI scan at that time.  Follow-Up Instructions: No follow-ups on file.   Orders:  Orders Placed This Encounter  Procedures   XR KNEE 3 VIEW LEFT   No orders of the defined types were placed in this encounter.     Procedures: Large Joint Inj: L knee on 03/09/2023 2:52 PM Indications: diagnostic evaluation, joint swelling and pain Details: 18 G 1.5 in needle, superolateral approach  Arthrogram: No  Medications: 5 mL lidocaine  1 %; 40 mg methylPREDNISolone acetate 40 MG/ML; 4 mL bupivacaine 0.25 % Outcome: tolerated well, no immediate complications Procedure, treatment alternatives, risks and benefits explained, specific risks discussed. Consent was given by the patient. Immediately prior to procedure a time out was called to verify the correct patient, procedure, equipment, support staff and site/side marked as required. Patient was prepped and draped in the usual sterile fashion.       Clinical Data: No additional findings.  Objective: Vital Signs: There were no vitals taken for this visit.  Physical Exam:  Constitutional: Patient appears  well-developed HEENT:  Head: Normocephalic Eyes:EOM are normal Neck: Normal range of motion Cardiovascular: Normal rate Pulmonary/chest: Effort normal Neurologic: Patient is alert Skin: Skin is warm Psychiatric: Patient has normal mood and affect  Ortho Exam: Patient exam demonstrates left knee with no significant effusion.  Does have mild to moderate tenderness over the medial and lateral joint lines.  Positive patellar grind test.  Able to perform straight leg raise without extensor lag.  No pain with hip range of motion.  Stable to anterior and posterior drawer sign.  Stable to Lachman exam.  Stable to varus and valgus stress at 0 and 30 degrees.  Incisions are well-healed.  Left leg is warm and well-perfused.  Prior skin grafting site evident in the lateral calf.  Specialty Comments:  No specialty comments available.  Imaging: No results found.   PMFS History: Patient Active Problem List   Diagnosis Date Noted   Hypertriglyceridemia 05/17/2022   Type 2 diabetes mellitus with hyperglycemia, without long-term current use of insulin (HCC) 02/16/2022   Tobacco use 04/16/2018   Morbid obesity (HCC) 04/16/2018   Hypertension 06/02/2015   Past Medical History:  Diagnosis Date   Arthritis    Diabetes mellitus without complication (HCC)    Type 2   Hypercholesteremia    Hypertension    Pneumonia    Dec. 2023   Sleep apnea    Stroke (HCC) 2012   Bell's Palsy Deficit. Now resolved   TIA (transient ischemic attack) 2012    Family History  Problem Relation Age of Onset   Diabetes Mother    Healthy Father     Past Surgical History:  Procedure Laterality Date   FOOT SURGERY Left 2004   KNEE SURGERY Left 2004   ORIF RADIUS & ULNA FRACTURES Left 2004   ORIF TIBIA & FIBULA FRACTURES Left 2004   ORTHOPEDIC SURGERY     PILONIDAL CYST EXCISION N/A 06/29/2022   Procedure: CYST EXCISION PILONIDAL EXTENSIVE;  Surgeon: Harriette Bouillon, MD;  Location: [facility] OR;  Service: General;   Laterality: N/A;   SHOULDER SURGERY     Social History   Occupational History   Not on file  Tobacco Use   Smoking status: Some Days    Types: Cigars   Smokeless tobacco: Never  Vaping Use   Vaping status: Never Used  Substance and Sexual Activity   Alcohol use: Yes    Comment: occasionally   Drug use: No   Sexual activity: Yes    Birth control/protection: None

## 2023-03-24 ENCOUNTER — Other Ambulatory Visit: Payer: Self-pay | Admitting: Family Medicine

## 2023-03-24 DIAGNOSIS — I10 Essential (primary) hypertension: Secondary | ICD-10-CM

## 2023-03-24 DIAGNOSIS — E1165 Type 2 diabetes mellitus with hyperglycemia: Secondary | ICD-10-CM

## 2023-03-26 ENCOUNTER — Telehealth: Payer: Self-pay

## 2023-03-26 NOTE — Telephone Encounter (Signed)
 Initial Comment Caller states needs confirmation to get diabetes and bp meds; out of meds; sees Minna Amass, DNP,FNP-BC, [facility], [address], [place], [place] [address], [phone] not feeling great; BS was 93 this morning; slight headache; GOTO Facility Not Listed Calling PCP for refills Translation No Nurse Assessment Nurse: Martine Sleek, RN, Alexandria Ida Date/Time (Eastern Time): 03/24/2023 9:44:45 AM Confirm and document reason for call. If symptomatic, describe symptoms. ---Caller requesting refills for BP meds: Rosuvastatin  20 mg Lisinopril -hydrochlorithazide 20-12.5 Amlodipine  5 mg [facility] on [address] # [phone]. Last took a dose of these on Thursday. States he has a headache. His BP is currently 136/69. States he currently has a headache. Does the patient have any new or worsening symptoms? ---Yes Will a triage be completed? ---Yes Related visit to physician within the last 2 weeks? ---No Does the PT have any chronic conditions? (i.e. diabetes, asthma, this includes High risk factors for pregnancy, etc.) ---Yes List chronic conditions. ---diabetes, htn Is this a behavioral health or substance abuse call? ---No PLEASE NOTE: All timestamps contained within this report are represented as [place] Standard Time. CONFIDENTIALTY NOTICE: This fax transmission is intended only for the addressee. It contains information that is legally privileged, confidential or otherwise protected from use or disclosure. If you are not the intended recipient, you are strictly prohibited from reviewing, disclosing, copying using or disseminating any of this information or taking any action in reliance on or regarding this information. If you have received this fax in error, please notify us  immediately by telephone so that we can arrange for its return to us . Phone: [phone], Toll-Free: [phone], Fax: [phone] Page: 2 of  4 Call Id: [id number] Guidelines Guideline Title Affirmed Question Affirmed Notes Nurse Date/Time Redgie Cancer Time) Blood Pressure - High Ran out of BP medications Martine Sleek, RN, Alexandria Ida 03/24/2023 9:46:54 AM Disp. Time Redgie Cancer Time) Disposition Final User 03/24/2023 9:48:57 AM Call PCP within 24 Hours Yes Marcy Sexton 03/24/2023 9:50:37 AM Pharmacy Call Martine Sleek, RN, Alexandria Ida Reason: [facility] on [address] # [phone] see med orders. Final Disposition 03/24/2023 9:48:57 AM Call PCP within 24 Hours Yes Martine Sleek, RN, Alexandria Ida Caller Disagree/Comply Comply Caller Understands Yes PreDisposition Call Pharmacist Care Advice Given Per Guideline CALL PCP WITHIN 24 HOURS: CALL BACK IF: * Weakness or numbness of the face, arm or leg on one side of the body occurs * Difficulty walking, difficulty talking, or severe headache occurs * Chest pain or difficulty breathing occurs * You become worse Verbal Orders/Maintenance Medications Medication Refill Route Dosage Regime Duration Admin Instructions User Name Rosuvastatin  20 mg Yes Oral Take 1 tablet nightly 3 Days Prescriber: Minna Amass, DNP Martine Sleek, RN, Tara LisinoprilHydrochlorithiazide 20-12.5 mg Yes Oral Take 1 tablet daily 3 Days Prescriber: Minna Amass, DNP Martine Sleek, RN, Alexandria Ida Amlodipine  5 mg Yes Oral Take 1 pill daily 3 Days Prescriber: Minna Amass, DNP Martine Sleek, RN, Alexandria Ida Comments User: Nevada Barbara, RN Date/Time Redgie Cancer Time): 03/24/2023 9:58:32 AM Refills called in on confirmed maintenance medications. See med orders. Referrals GO TO FACILITY OTHER - SPECIFY

## 2023-04-16 ENCOUNTER — Telehealth: Payer: Self-pay | Admitting: Neurology

## 2023-04-16 NOTE — Telephone Encounter (Signed)
  Patient has refills. Does he need a PA?   Copied from CRM [id number]. Topic: Clinical - Medication Refill >> Apr 16, 2023  3:40 PM Fredrich Romans wrote: Most Recent Primary Care Visit:  Provider: Clayborne Dana  Department: [facility]  Visit Type: OFFICE VISIT  Date: 02/15/2023  Medication: Dulaglutide (TRULICITY) 3 MG/0.5ML SOAJ  Has the patient contacted their pharmacy? Yes (Agent: If no, request that the patient contact the pharmacy for the refill. If patient does not wish to contact the pharmacy document the reason why and proceed with request.) (Agent: If yes, when and what did the pharmacy advise?)  Is this the correct pharmacy for this prescription? Yes If no, delete pharmacy and type the correct one.  This is the patient's preferred pharmacy:  [id number] [place], [place] - [address] AT [facility] [address] [place] [address] Phone: [phone] Fax: [phone]     Has the prescription been filled recently? No  Is the patient out of the medication? Yes  Has the patient been seen for an appointment in the last year OR does the patient have an upcoming appointment? Yes  Can we respond through MyChart? Yes  *Patient would like to know is there any way he could receive a 3 month supply due to him having a hard time finding medication every month?  Agent: Please be advised that Rx refills may take up to 3 business days. We ask that you follow-up with your pharmacy.

## 2023-04-17 ENCOUNTER — Telehealth: Payer: Self-pay

## 2023-04-17 ENCOUNTER — Other Ambulatory Visit (HOSPITAL_COMMUNITY): Payer: Self-pay

## 2023-04-17 NOTE — Telephone Encounter (Signed)
 PA request has been Submitted. New Encounter has been or will be created for follow up. For additional info see Pharmacy Prior Auth telephone encounter from 04/17/23.

## 2023-04-17 NOTE — Telephone Encounter (Signed)
 Pharmacy Patient Advocate Encounter   Received notification from Pt Calls Messages that prior authorization for Trulicity 3MG /0.5ML auto-injectors is required/requested.   Insurance verification completed.   The patient is insured through [employer] .   Per test claim: PA required; PA submitted to above mentioned insurance via CoverMyMeds Key/confirmation #/EOC [id number] Status is pending

## 2023-04-20 ENCOUNTER — Telehealth: Payer: Self-pay | Admitting: Family Medicine

## 2023-04-20 NOTE — Telephone Encounter (Signed)
 Copied from CRM [id number]. Topic: Clinical - Prescription Issue >> Apr 20, 2023 11:40 AM Denese Killings wrote: Reason for CRM: Patient is out of Trulicity 3MG /0.5ML auto-injectors. He states that he is out of medication and is having headaches because of it. Please call pt with an update.

## 2023-04-20 NOTE — Telephone Encounter (Signed)
 PA started 3 days ago, do we have an update?

## 2023-04-23 ENCOUNTER — Other Ambulatory Visit (HOSPITAL_COMMUNITY): Payer: Self-pay

## 2023-04-23 NOTE — Telephone Encounter (Signed)
 Patient made aware this has been approved.

## 2023-04-23 NOTE — Telephone Encounter (Signed)
 PA request has been Approved. New Encounter has been or will be created for follow up. For additional info see Pharmacy Prior Auth telephone encounter from 04/17/23.

## 2023-04-23 NOTE — Telephone Encounter (Signed)
 Pharmacy Patient Advocate Encounter  Received notification from [employer] that Prior Authorization for Trulicity 3MG /0.5ML auto-injectors has been APPROVED from 04/17/23 to 04/20/24. Ran test claim, Copay is $25. This test claim was processed through [facility]- copay amounts may vary at other pharmacies due to pharmacy/plan contracts, or as the patient moves through the different stages of their insurance plan.   PA #/Case ID/Reference #: [id number]

## 2023-05-11 ENCOUNTER — Ambulatory Visit: Payer: Commercial Managed Care - HMO | Admitting: Surgical

## 2023-05-18 ENCOUNTER — Ambulatory Visit: Admitting: Surgical

## 2023-05-20 ENCOUNTER — Encounter (HOSPITAL_COMMUNITY): Payer: Self-pay | Admitting: Emergency Medicine

## 2023-05-20 ENCOUNTER — Other Ambulatory Visit: Payer: Self-pay

## 2023-05-20 ENCOUNTER — Emergency Department (EMERGENCY_DEPARTMENT_HOSPITAL)
Admission: EM | Admit: 2023-05-20 | Discharge: 2023-05-21 | Disposition: A | Discharge status: Other Acute Inpatient Hospital | Payer: Self-pay | Source: Home / Self Care | Attending: Emergency Medicine | Admitting: Emergency Medicine

## 2023-05-20 ENCOUNTER — Encounter (HOSPITAL_COMMUNITY): Payer: Self-pay | Admitting: Psychiatry

## 2023-05-20 ENCOUNTER — Emergency Department (HOSPITAL_COMMUNITY)
Admission: EM | Admit: 2023-05-20 | Discharge: 2023-05-20 | Discharge status: Against Medical Advice | Payer: Self-pay | Attending: Emergency Medicine | Admitting: Emergency Medicine

## 2023-05-20 DIAGNOSIS — T39392A Poisoning by other nonsteroidal anti-inflammatory drugs [NSAID], intentional self-harm, initial encounter: Secondary | ICD-10-CM | POA: Insufficient documentation

## 2023-05-20 DIAGNOSIS — T50912A Poisoning by multiple unspecified drugs, medicaments and biological substances, intentional self-harm, initial encounter: Secondary | ICD-10-CM | POA: Diagnosis not present

## 2023-05-20 DIAGNOSIS — F4325 Adjustment disorder with mixed disturbance of emotions and conduct: Secondary | ICD-10-CM | POA: Diagnosis not present

## 2023-05-20 DIAGNOSIS — E119 Type 2 diabetes mellitus without complications: Secondary | ICD-10-CM | POA: Insufficient documentation

## 2023-05-20 DIAGNOSIS — Z8673 Personal history of transient ischemic attack (TIA), and cerebral infarction without residual deficits: Secondary | ICD-10-CM | POA: Insufficient documentation

## 2023-05-20 DIAGNOSIS — E876 Hypokalemia: Secondary | ICD-10-CM | POA: Insufficient documentation

## 2023-05-20 DIAGNOSIS — T400X2A Poisoning by opium, intentional self-harm, initial encounter: Secondary | ICD-10-CM | POA: Insufficient documentation

## 2023-05-20 DIAGNOSIS — Z5329 Procedure and treatment not carried out because of patient's decision for other reasons: Secondary | ICD-10-CM | POA: Insufficient documentation

## 2023-05-20 DIAGNOSIS — I10 Essential (primary) hypertension: Secondary | ICD-10-CM | POA: Insufficient documentation

## 2023-05-20 DIAGNOSIS — Z79899 Other long term (current) drug therapy: Secondary | ICD-10-CM | POA: Insufficient documentation

## 2023-05-20 DIAGNOSIS — T50902A Poisoning by unspecified drugs, medicaments and biological substances, intentional self-harm, initial encounter: Secondary | ICD-10-CM

## 2023-05-20 DIAGNOSIS — T1491XA Suicide attempt, initial encounter: Secondary | ICD-10-CM

## 2023-05-20 DIAGNOSIS — T481X2A Poisoning by skeletal muscle relaxants [neuromuscular blocking agents], intentional self-harm, initial encounter: Secondary | ICD-10-CM | POA: Insufficient documentation

## 2023-05-20 LAB — URINALYSIS, ROUTINE W REFLEX MICROSCOPIC
Bilirubin Urine: NEGATIVE
Glucose, UA: NEGATIVE mg/dL
Hgb urine dipstick: NEGATIVE
Ketones, ur: NEGATIVE mg/dL
Nitrite: NEGATIVE
Protein, ur: 30 mg/dL — AB
Specific Gravity, Urine: 1.03 (ref 1.005–1.030)
pH: 5 (ref 5.0–8.0)

## 2023-05-20 LAB — CBC WITH DIFFERENTIAL/PLATELET
Abs Immature Granulocytes: 0.02 10*3/uL (ref 0.00–0.07)
Basophils Absolute: 0.1 10*3/uL (ref 0.0–0.1)
Basophils Relative: 1 %
Eosinophils Absolute: 0.2 10*3/uL (ref 0.0–0.5)
Eosinophils Relative: 3 %
HCT: 43.3 % (ref 39.0–52.0)
Hemoglobin: 14.4 g/dL (ref 13.0–17.0)
Immature Granulocytes: 0 %
Lymphocytes Relative: 28 %
Lymphs Abs: 2.1 10*3/uL (ref 0.7–4.0)
MCH: 29.3 pg (ref 26.0–34.0)
MCHC: 33.3 g/dL (ref 30.0–36.0)
MCV: 88.2 fL (ref 80.0–100.0)
Monocytes Absolute: 0.7 10*3/uL (ref 0.1–1.0)
Monocytes Relative: 9 %
Neutro Abs: 4.5 10*3/uL (ref 1.7–7.7)
Neutrophils Relative %: 59 %
Platelets: 209 10*3/uL (ref 150–400)
RBC: 4.91 MIL/uL (ref 4.22–5.81)
RDW: 13.5 % (ref 11.5–15.5)
WBC: 7.6 10*3/uL (ref 4.0–10.5)
nRBC: 0 % (ref 0.0–0.2)

## 2023-05-20 LAB — RAPID URINE DRUG SCREEN, HOSP PERFORMED
Amphetamines: NOT DETECTED
Barbiturates: NOT DETECTED
Benzodiazepines: NOT DETECTED
Cocaine: NOT DETECTED
Opiates: NOT DETECTED
Tetrahydrocannabinol: POSITIVE — AB

## 2023-05-20 LAB — COMPREHENSIVE METABOLIC PANEL WITH GFR
ALT: 33 U/L (ref 0–44)
AST: 36 U/L (ref 15–41)
Albumin: 3.6 g/dL (ref 3.5–5.0)
Alkaline Phosphatase: 50 U/L (ref 38–126)
Anion gap: 11 (ref 5–15)
BUN: 9 mg/dL (ref 6–20)
CO2: 24 mmol/L (ref 22–32)
Calcium: 8.8 mg/dL — ABNORMAL LOW (ref 8.9–10.3)
Chloride: 104 mmol/L (ref 98–111)
Creatinine, Ser: 1.12 mg/dL (ref 0.61–1.24)
GFR, Estimated: >60 mL/min (ref >60)
Glucose, Bld: 145 mg/dL — ABNORMAL HIGH (ref 70–99)
Potassium: 3 mmol/L — ABNORMAL LOW (ref 3.5–5.1)
Sodium: 139 mmol/L (ref 135–145)
Total Bilirubin: 0.6 mg/dL (ref 0.0–1.2)
Total Protein: 6.3 g/dL — ABNORMAL LOW (ref 6.5–8.1)

## 2023-05-20 LAB — ETHANOL: Alcohol, Ethyl (B): 15 mg/dL — ABNORMAL HIGH (ref <10)

## 2023-05-20 LAB — ACETAMINOPHEN LEVEL: Acetaminophen (Tylenol), Serum: <10 ug/mL — ABNORMAL LOW (ref 10–30)

## 2023-05-20 LAB — SALICYLATE LEVEL: Salicylate Lvl: <7 mg/dL — ABNORMAL LOW (ref 7.0–30.0)

## 2023-05-20 LAB — MAGNESIUM: Magnesium: 1.8 mg/dL (ref 1.7–2.4)

## 2023-05-20 MED ORDER — LISINOPRIL-HYDROCHLOROTHIAZIDE 20-12.5 MG PO TABS: 1.0000 | ORAL_TABLET | Freq: Every day | ORAL | Status: DC

## 2023-05-20 MED ORDER — AMLODIPINE BESYLATE 5 MG PO TABS
5.0000 mg | ORAL_TABLET | Freq: Every day | ORAL | Status: DC
  Administered 2023-05-21: 5 mg via ORAL
  Filled 2023-05-20: qty 1

## 2023-05-20 MED ORDER — POTASSIUM CHLORIDE CRYS ER 20 MEQ PO TBCR
40.0000 meq | EXTENDED_RELEASE_TABLET | Freq: Once | ORAL | Status: AC
  Administered 2023-05-20: 40 meq via ORAL
  Filled 2023-05-20: qty 2

## 2023-05-20 MED ORDER — HYDROCHLOROTHIAZIDE 12.5 MG PO TABS
12.5000 mg | ORAL_TABLET | Freq: Every day | ORAL | Status: DC
  Administered 2023-05-21: 12.5 mg via ORAL
  Filled 2023-05-20: qty 1

## 2023-05-20 MED ORDER — ALBUTEROL SULFATE HFA 108 (90 BASE) MCG/ACT IN AERS: 1.0000 | INHALATION_SPRAY | Freq: Four times a day (QID) | RESPIRATORY_TRACT | Status: DC | PRN

## 2023-05-20 MED ORDER — POTASSIUM CHLORIDE CRYS ER 20 MEQ PO TBCR
20.0000 meq | EXTENDED_RELEASE_TABLET | Freq: Two times a day (BID) | ORAL | Status: DC
  Administered 2023-05-21: 20 meq via ORAL
  Filled 2023-05-20: qty 1

## 2023-05-20 MED ORDER — ALBUTEROL SULFATE (2.5 MG/3ML) 0.083% IN NEBU: 2.5000 mg | INHALATION_SOLUTION | Freq: Four times a day (QID) | RESPIRATORY_TRACT | Status: DC | PRN

## 2023-05-20 MED ORDER — ROSUVASTATIN CALCIUM 5 MG PO TABS
20.0000 mg | ORAL_TABLET | Freq: Every day | ORAL | Status: DC
  Administered 2023-05-21: 20 mg via ORAL
  Filled 2023-05-20: qty 4

## 2023-05-20 MED ORDER — LISINOPRIL 20 MG PO TABS
20.0000 mg | ORAL_TABLET | Freq: Every day | ORAL | Status: DC
  Administered 2023-05-21: 20 mg via ORAL
  Filled 2023-05-20: qty 1

## 2023-05-20 NOTE — ED Notes (Signed)
 IVC Envelope [id number] had to be refiled due to no SS#

## 2023-05-20 NOTE — ED Notes (Addendum)
Pt given turkey sandwich and gingerale. 

## 2023-05-20 NOTE — ED Notes (Signed)
Pt given gingerale and saltines per request.

## 2023-05-20 NOTE — ED Notes (Signed)
 Pt not at his stretcher when this paramedic came out from another room. This paramedic, a NT and another RN went around department looking for him. Charge nurse notified and [employer] will be called.

## 2023-05-20 NOTE — ED Notes (Signed)
 Pt stating he is going to leave. Pt advised he can not leave. He states he is cold and this is "not fair." Pt given warm blanket and is laying on stretcher at this time.

## 2023-05-20 NOTE — ED Provider Notes (Signed)
 Patient eloped, was found by police and is now awake, alert, sitting up, pleasantly interactive, awaiting behavioral health consult.  Apparently the patient had issues with the food that was provided as it contained pork product, left to obtain non-pork product containing food.  Patient is under involuntary commitment.  Psychiatry has been consulted again.   Gerhard Munch, MD 05/20/23 [phone]

## 2023-05-20 NOTE — ED Notes (Signed)
 Pt left [employer] notified by NS Patrice.

## 2023-05-20 NOTE — ED Triage Notes (Addendum)
 Pt BIB EMS from his car for intentional OD. Pt was found with low RR with alcohol and multiple bottles of pills around him. Naproxen, oxycodone, and tizanidine found with him. Given 1mg  Narcan, became A&O. RR and O2 later decreased, 1mg  narcan given, pt became A&O. Pt denies SI in triage.  1mg  x 2 IN  138/84 98 [place] 3lpm 90HR

## 2023-05-20 NOTE — Consult Note (Signed)
 Patient attempted to be seen but appreciably eloped from the emergency department.    Per charge nurse, [employer] has reportedly been notified, given the patient is involuntarily committed.   Notified nursing and charge to alert this provider when/if patient is found.

## 2023-05-20 NOTE — ED Provider Notes (Signed)
 [facility] EMERGENCY DEPARTMENT AT [facility] Provider Note   CSN: [id number] Arrival date & time: 05/20/23  0259     History  Chief Complaint  Patient presents with   Drug Overdose    [name] [name] is a 43 y.o. male with PMH as listed below who presents BIB EMS from his car for intentional OD. Pt was found with low RR with alcohol and multiple bottles of pills around him. Naproxen, oxycodone, and tizanidine found with him. Given 1mg  Narcan, became A&O. RR and O2 later decreased, 1mg  narcan given, pt became A&O. On arrival patient is somnolent but arousable to voice. Patient denies any physical pain or symptoms.    1mg  x 2 doses IN narcan   138/84 98 [place] 3lpm 90HR  Collateral from patient's friends in the waiting room state that he texted them messages saying, "Sorry, I can't do this anymore," which is what prompted them to call 911 out of concern for his safety.   Patient is now denying that he was trying to harm himself, but then will also say, "it was a moment of weakness."    Past Medical History:  Diagnosis Date   Arthritis    Diabetes mellitus without complication (HCC)    Type 2   Hypercholesteremia    Hypertension    Pneumonia    Dec. 2023   Sleep apnea    Stroke (HCC) 2012   Bell's Palsy Deficit. Now resolved   TIA (transient ischemic attack) 2012       Home Medications Prior to Admission medications   Medication Sig Start Date End Date Taking? Authorizing Provider  albuterol (VENTOLIN HFA) 108 (90 Base) MCG/ACT inhaler Inhale 1-2 puffs into the lungs every 6 (six) hours as needed for wheezing or shortness of breath. 12/14/22   Henderly, Britni A, PA-C  amLODipine (NORVASC) 5 MG tablet TAKE 1 TABLET(5 MG) BY MOUTH DAILY 03/26/23   Clayborne Dana, NP  blood glucose meter kit and supplies KIT Dispense based on patient and insurance preference. Use up to four times daily as directed. Please include lancets, test strips, control solution. 02/16/22    Clayborne Dana, NP  Dulaglutide (TRULICITY) 3 MG/0.5ML SOAJ Inject 3 mg into the skin once a week. 02/27/23   Clayborne Dana, NP  fexofenadine (ALLEGRA) 180 MG tablet Take 1 tablet (180 mg total) by mouth daily. 01/19/23   Clayborne Dana, NP  fluticasone (FLONASE) 50 MCG/ACT nasal spray Place 2 sprays into both nostrils daily. 02/15/23   Clayborne Dana, NP  guaiFENesin (MUCINEX) 600 MG 12 hr tablet Take 2 tablets (1,200 mg total) by mouth 2 (two) times daily as needed. 02/15/23   Clayborne Dana, NP  lisinopril-hydrochlorothiazide (ZESTORETIC) 20-12.5 MG tablet TAKE 1 TABLET BY MOUTH DAILY 03/26/23   Clayborne Dana, NP  meloxicam (MOBIC) 15 MG tablet Take 1 tablet (15 mg total) by mouth daily as needed for pain. 03/09/23   Magnant, Charles L, PA-C  oxyCODONE (OXY IR/ROXICODONE) 5 MG immediate release tablet Take 1 tablet (5 mg total) by mouth every 6 (six) hours as needed for severe pain (pain score 7-10). 02/28/23   Clayborne Dana, NP  potassium chloride SA (KLOR-CON M) 20 MEQ tablet Take 1 tablet (20 mEq total) by mouth 2 (two) times daily. 02/21/23   Arby Barrette, MD  rosuvastatin (CRESTOR) 20 MG tablet TAKE 1 TABLET(20 MG) BY MOUTH DAILY 03/26/23   Clayborne Dana, NP  Allergies    Tea    Review of Systems   Review of Systems A 10 point review of systems was performed and is negative unless otherwise reported in HPI.  Physical Exam Updated Vital Signs BP 130/85   Pulse 69   Temp 98 F (36.7 C)   Resp 15   Ht 6\' 4"  (1.93 m)   Wt (!) 187 kg   SpO2 100%   BMI 50.18 kg/m  Physical Exam General: Normal appearing male, lying in bed.  HEENT: PERRLA, Sclera anicteric, MMM, trachea midline.  Cardiology: RRR, no murmurs/rubs/gallops. BL radial and DP pulses equal bilaterally.  Resp: Normal respiratory rate and effort. CTAB, no wheezes, rhonchi, crackles.  Abd: Soft, non-tender, non-distended. No rebound tenderness or guarding.  GU: Deferred. MSK: No peripheral edema or signs of trauma.  Extremities without deformity or TTP. No cyanosis or clubbing. Skin: warm, dry. No rashes or lesions. Back: No CVA tenderness Neuro: somnolent but arousable to voice, oriented x 4, CNs II-XII grossly intact. MAEs. Sensation grossly intact.  Psych: flat affect   ED Results / Procedures / Treatments   Labs (all labs ordered are listed, but only abnormal results are displayed) Labs Reviewed  COMPREHENSIVE METABOLIC PANEL WITH GFR - Abnormal; Notable for the following components:      Result Value   Potassium 3.0 (*)    Glucose, Bld 145 (*)    Calcium 8.8 (*)    Total Protein 6.3 (*)    All other components within normal limits  ETHANOL - Abnormal; Notable for the following components:   Alcohol, Ethyl (B) 15 (*)    All other components within normal limits  ACETAMINOPHEN LEVEL - Abnormal; Notable for the following components:   Acetaminophen (Tylenol), Serum <10 (*)    All other components within normal limits  SALICYLATE LEVEL - Abnormal; Notable for the following components:   Salicylate Lvl <7.0 (*)    All other components within normal limits  CBC WITH DIFFERENTIAL/PLATELET  MAGNESIUM  RAPID URINE DRUG SCREEN, HOSP PERFORMED  URINALYSIS, ROUTINE W REFLEX MICROSCOPIC    EKG EKG Interpretation Date/Time:  Sunday May 20 2023 03:01:23 EDT Ventricular Rate:  83 PR Interval:  163 QRS Duration:  87 QT Interval:  379 QTC Calculation: 446 R Axis:   53  Text Interpretation: Sinus rhythm Borderline repolarization abnormality Confirmed by Vivi Barrack [phone]) on 05/20/2023 6:54:20 AM  Radiology No results found.  Procedures Procedures    Medications Ordered in ED Medications  potassium chloride SA (KLOR-CON M) CR tablet 40 mEq (has no administration in time range)    ED Course/ Medical Decision Making/ A&P                          Medical Decision Making Amount and/or Complexity of Data Reviewed Labs: ordered.    This patient presents to the ED for concern of  intentional drug overdose, this involves an extensive number of treatment options, and is a complaint that carries with it a high risk of complications and morbidity.  I considered the following differential and admission for this acute, potentially life threatening condition. On arrival patient is somnolent but protecting airway and HDS.    MDM:    Patient likely presenting with intentional drug overdose. Considering suicidal ideation, depression. Patient denies intent to harm himself but his friends in the waiting room state that they received text messages from him that were concerning enough that they called 911. The texts  read things such as "I'm sorry, I wasn't strong enough," and "I just couldn't do it." Given that patient did ingest potentially lethal amount of opiates today and in the context of these text messages I will IVC the patient for SI/suicide attempt. Labs reassuring. Mild hypokalemia repleted. No c/f homicidal ideation, manic episode or underlying bipolar disease, psychotic episode or underlying schizophrenia, or substance-induced psychosis. Low c/f infectious process (no localizing symptoms to suggest urinary, pulmonary, or gastrointestinal infection), electrolyte abnormality, TBI, or CVA.  Patient is medically cleared pending psychiatric disposition.     Labs: I Ordered, and personally interpreted labs.  The pertinent results include:  those listed above  Additional history obtained from chart review, friends in waiting room  Reevaluation: After the interventions noted above, I reevaluated the patient and found that they have :stayed the same  Social Determinants of Health: Lives independently  Disposition:  [facility]  Co morbidities that complicate the patient evaluation  Past Medical History:  Diagnosis Date   Arthritis    Diabetes mellitus without complication (HCC)    Type 2   Hypercholesteremia    Hypertension    Pneumonia    Dec. 2023   Sleep apnea    Stroke  (HCC) 2012   Bell's Palsy Deficit. Now resolved   TIA (transient ischemic attack) 2012     Medicines Meds ordered this encounter  Medications   potassium chloride SA (KLOR-CON M) CR tablet 40 mEq    I have reviewed the patients home medicines and have made adjustments as needed  Problem List / ED Course: Problem List Items Addressed This Visit   None Visit Diagnoses       Intentional drug overdose, initial encounter [facility])    -  Primary                   This note was created using dictation software, which may contain spelling or grammatical errors.    Loetta Rough, MD 05/20/23 [phone]

## 2023-05-20 NOTE — ED Notes (Signed)
 IVC paperwork stampped to be filled out to be completed by [facility] NS

## 2023-05-20 NOTE — ED Notes (Signed)
 Pt wanded by security, belongings placed in locker 1, small one. Valuables given to security

## 2023-05-20 NOTE — ED Notes (Signed)
 Non-Emergency number called to locate patient

## 2023-05-20 NOTE — Consult Note (Cosign Needed Addendum)
 [facility] Psychiatric Consult Initial  Patient Name: .[name] [name]  MRN: [id number]  DOB: 1980-11-11  Consult Order details:  Orders (From admission, onward)     Start     Ordered   05/20/23 1720  CONSULT TO CALL ACT TEAM       Ordering Provider: Gerhard Munch, MD  Provider:  (Not yet assigned)  Question:  Reason for Consult?  Answer:  SI   05/20/23 1720             Mode of Visit: In person    Psychiatry Consult Evaluation  Service Date: May 20, 2023 LOS:  LOS: 0 days  Chief Complaint: "I'm good"  Primary Psychiatric Diagnoses  Suicide attempt by multiple drug overdose (HCC) 2.  Adjustment disorder with mixed disturbance of emotions and conduct  Assessment   [name] [name] is a 43 y.o. AA male with a past psychiatric history that includes none, and pertinent medical comorbidities/history that include hypertension, obesity, type 2 diabetes, and hyperlipidemia, who presented this encounter by way of EMS after reported and alleged intentional overdose, who after evaluation by EDP team, consulted psychiatry for further evaluation, recommendations, and care measures.  Patient currently remains under involuntary commitment at this time, but is medically clear, per EDP team.  Patient notably eloped from the emergency department earlier today, but came back on his own accord with [employer] voluntarily.  Upon evaluation, patient presents with symptomology that is most consistent with an adjustment disorder with mixed disturbance of emotions and conduct.  Evidence of this is appreciable from the patient's overdose on multiple p.o. medications and EtOH, which led to the patient being brought in, as well as endorsements of struggles with psychosocial stress around losing a 6-year relationship with his significant other.  Given the events that recently transpired, and evaluation conducted today, of which collateral was obtained from the patient's brother who endorses and shows  evidence that the patient did very likely intentionally try to overdose, recommendation is for inpatient mental health hospitalization at this time, for safety and stabilization of the patient.  Patient is not amenable to psychopharmacological intervention, refutes and desires not to participate in inpatient mental health hospitalization, thus at this time, will not start psychopharmacological intervention.  [facility] team though to review for bed placement and disposition.  Diagnoses:  Active Hospital problems: Principal Problem:   Suicide attempt by multiple drug overdose (HCC) Active Problems:   Adjustment disorder with mixed disturbance of emotions and conduct    Plan   # Adjustment disorder with mixed disturbance of emotions and conduct # Suicide attempt by multiple drug overdose  ## Psychiatric Medication Recommendations:   -None at this time, patient refuses  ## Medical Decision Making Capacity: Not specifically addressed in this encounter  ## Further Work-up: UDS  ## Disposition:--Recommend inpatient mental health hospitalization under involuntary commitment  ## Behavioral / Environmental: -Strict agitation/safety precautions    ## Safety and Observation Level:  - Based on my clinical evaluation, I estimate the patient to be at low risk of self harm in the current setting. - At this time, we recommend  1:1 Observation. This decision is based on my review of the chart including patient's history and current presentation, interview of the patient, mental status examination, and consideration of suicide risk including evaluating suicidal ideation, plan, intent, suicidal or self-harm behaviors, risk factors, and protective factors. This judgment is based on our ability to directly address suicide risk, implement suicide prevention strategies, and develop a safety plan  while the patient is in the clinical setting. Please contact our team if there is a concern that risk level has  changed.  CSSR Risk Category:   Suicide Risk Assessment: Patient has following modifiable risk factors for suicide: untreated depression, recklessness, and lack of access to outpatient mental health resources, which we are addressing by treatment recommendations/evaluation. Patient has following non-modifiable or demographic risk factors for suicide: male gender and separation or divorce Patient has the following protective factors against suicide: Supportive family, Supportive friends, Cultural, spiritual, or religious beliefs that discourage suicide, Frustration tolerance, no history of suicide attempts, and no history of NSSIB  Thank you for this consult request. Recommendations have been communicated to the primary team.  We will continue to follow at this time.   Lenox Ponds, NP      History of Present Illness   [name] [name] is a 43 y.o. AA male with a past psychiatric history that includes none, and pertinent medical comorbidities/history that include hypertension, obesity, type 2 diabetes, and hyperlipidemia, who presented this encounter by way of EMS after reported and alleged intentional overdose, who after evaluation by EDP team, consulted psychiatry for further evaluation, recommendations, and care measures.  Patient currently remains under involuntary commitment at this time, but is medically clear, per EDP team.  Patient notably eloped from the emergency department earlier today, but came back on his own accord with [employer] voluntarily.  Patient seen today at the [facility] emergency department for face-to-face psychiatric evaluation.  Upon evaluation, patient endorses immediately that the story is, "very simple", his consumption of excessive alcohol, and multiple pill bottles of medications, which included, naproxen, oxycodone, and tizanidine while in his car, which resulted in him being found by EMS with severely decreased respirations and O2 sats, was not a suicide attempt,  but rather, "me just being stupid and ridiculous".  Explaining why the patient was found in his car and had done the aforementioned act, patient endorses that he got into a big fight with his ex-girlfriend who he lives with, so out of severe frustration, states that, "I just got in my car and wanted to get messed up, I did not try to kill myself, I just said some things that were misconstrued".    Expanding on things that were misconstrued, patient asked directly about text messages that he sent to his brother, where he stated to his brother, "sorry, I cannot do this anymore", "I am sorry, I was not strong enough", and "I just could not do it", to which patient affirms that he did say those things over text message (as well as this was affirmed by collateral call placed to patient's brother by this provider).   Expanding on psychosocial stressors patient endorses around his ex-girlfriend, patient states that in November he and his girlfriend of 6 years began having problems, and 2 weeks ago they separated, which he states has been incredibly difficult for him, to the point where he has been frequently reaching out to his pastor, his brother, and friends for guidance and help mentally.  Patient endorses formally when asked that he is not experiencing suicidal and or homicidal ideations.  Patient endorses no history of suicide attempts and/or self-injurious behavior.  Patient endorses no history of mental health problems, denies inpatient and/or outpatient mental health service utilization.  Patient endorses no auditory and or visual hallucinations, and objectively, does not appear to be presenting with psychotic features.  Patient orientation is intact, no concerns or fluctuations in  consciousness.  Patient endorses a frustrated mood about how he has been being treated in the emergency department, with a congruent affect and interpersonal style, as well as good eye contact.  Patient denies any history of  therapy and/or utilization of mental health medications.  Patient endorses no struggles with depression and/or anxiety when asked formally, but when asked about his psychosocial stress of his recent termination of his long-term 6-year relationship, articulates incredible stress, difficulties with managing his emotions, and troubles with irritability/anger, and feeling sad.  Patient endorses no problems with sleeping or eating, states, "look at me, do I look like I do not eat well".  Patient endorses no EtOH use, states, "very rarely", states that this was the first time i.e. before being brought in, in a very long time (last use just prior to coming in).  Patient endorses no drug use, but per chart review, historically has used cannabis, has tested positive in the past for this.  Patient endorses no tobacco use, but per chart review, has a history of smoking cigars and cigarettes.    Discussed with patient that given the events that transpired, and significant collateral obtained from his brother, as well as his endorsements of struggling with the psychosocial stress around the recent termination of his long-term 6-year relationship with his ex-girlfriend, recommendation would be for inpatient mental health hospitalization at this time, for safety and stabilization of the patient.  Patient endorsed that he was very upset about this decision.   Collateral, patient's brother, [name] [name], spoken to at [phone]  Call placed in the presence of the patient, as the patient's brother stated he had no other ability to speak to this provider at a later time.  Patient's brother asked to confirm the text messages that were sent by the patient, to which he does, after each "is read to him.  Patient's brother reports that he is worried about his brothers mental health, but also suggest that he wants whenever his brother wants i.e. to not be inpatient mental health hospitalized.  Discussed with the patient and his  brother again, the recommendations are for him at this time.   Review of Systems  Psychiatric/Behavioral:  Positive for substance abuse (ETOH, pills). Negative for depression, hallucinations and suicidal ideas. The patient is not nervous/anxious and does not have insomnia.   All other systems reviewed and are negative.   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 liquor Type of alcohol : Liquor Last Drink : Rarely Number of drinks per day : Rarely History of alcohol withdrawal seizures :None reported History of DT's :None reported Tobacco: History of tobacco Illicit drugs: History of cannabis Prescription drug abuse: Appreciably took oxycodone and tizanidine this encounter Rehab hx: None reported  Exam Findings  Physical Exam: As below Vital Signs:  Temp:  [98 F (36.7 C)] 98 F (36.7 C) (04/06 0307) Pulse Rate:  [69-81] 69 (04/06  0630) Resp:  [11-20] 15 (04/06 0630) BP: (113-131)/(67-86) 130/85 (04/06 0630) SpO2:  [91 %-100 %] 100 % (04/06 0630) Weight:  [161 kg] 187 kg (04/06 0304) There were no vitals taken for this visit. There is no height or weight on file to calculate BMI.  Physical Exam Vitals and nursing note reviewed.   Constitutional:      General: He is not in acute distress.    Appearance: He is obese. He is not ill-appearing, toxic-appearing or diaphoretic.     Comments: Frustrated interpersonal style  Pulmonary:     Effort: Pulmonary effort is normal.  Skin:    General: Skin is warm and dry.  Neurological:     Mental Status: He is alert and oriented to person, place, and time.     Motor: No seizure activity.  Psychiatric:        Attention and Perception: Attention and perception normal. He does not perceive auditory or visual hallucinations.        Speech: Speech normal.        Behavior: Behavior is agitated. Behavior is not aggressive or combative. Behavior is cooperative.        Thought Content: Thought content is not paranoid or delusional. Thought content does not include homicidal or suicidal ideation.        Cognition and Memory: Cognition and memory normal.        Judgment: Judgment is inappropriate.   Mental Status Exam: General Appearance: Casual and Well Groomed  Orientation:  Full (Time, Place, and Person)  Memory:   Within defined limits  Concentration:  Concentration: Fair and Attention Span: Fair  Recall:  Fair  Attention  Fair  Eye Contact:  Fair  Speech:  Clear and Coherent and Normal Rate  Language:  Fair  Volume:  Normal  Mood: Frustrated  Affect:  Congruent  Thought Process:  Coherent, Goal Directed, and Linear  Thought Content:  Negative and Logical  Suicidal Thoughts:  No  Homicidal Thoughts:  No  Judgement:  Poor  Insight:  Lacking, Present, and Shallow  Psychomotor Activity:  Normal  Akathisia:  No  Fund of Knowledge:  Fair      Assets:  Manufacturing systems engineer Desire for Improvement Financial Resources/Insurance Housing Leisure Time Physical Health Resilience Social Support Talents/Skills Transportation Vocational/Educational  Cognition:  WNL  ADL's:  Intact  AIMS (if indicated):   0     Other History   These have been pulled in through the EMR,  reviewed, and updated if appropriate.  Family History:  The patient's family history includes Diabetes in his mother; Healthy in his father.  Medical History: Past Medical History:  Diagnosis Date   Arthritis    Diabetes mellitus without complication (HCC)    Type 2   Hypercholesteremia    Hypertension    Pneumonia    Dec. 2023   Sleep apnea    Stroke (HCC) 2012   Bell's Palsy Deficit. Now resolved   TIA (transient ischemic attack) 2012    Surgical History: Past Surgical History:  Procedure Laterality Date   FOOT SURGERY Left 2004   KNEE SURGERY Left 2004   ORIF RADIUS & ULNA FRACTURES Left 2004   ORIF TIBIA & FIBULA FRACTURES Left 2004   ORTHOPEDIC SURGERY     PILONIDAL CYST EXCISION N/A 06/29/2022   Procedure: CYST EXCISION PILONIDAL EXTENSIVE;  Surgeon: Harriette Bouillon, MD;  Location: [facility] OR;  Service: General;  Laterality: N/A;   SHOULDER SURGERY  Medications:  No current facility-administered medications for this encounter.  Current Outpatient Medications:    albuterol (VENTOLIN HFA) 108 (90 Base) MCG/ACT inhaler, Inhale 1-2 puffs into the lungs every 6 (six) hours as needed for wheezing or shortness of breath., Disp: 6.7 g, Rfl: 0   amLODipine (NORVASC) 5 MG tablet, TAKE 1 TABLET(5 MG) BY MOUTH DAILY, Disp: 90 tablet, Rfl: 0   blood glucose meter kit and supplies KIT, Dispense based on patient and insurance preference. Use up to four times daily as directed. Please include lancets, test strips, control solution., Disp: 1 each, Rfl: 0   Dulaglutide (TRULICITY) 3 MG/0.5ML SOAJ, Inject 3 mg into the skin once a week., Disp: 6 mL, Rfl: 1   fexofenadine (ALLEGRA) 180 MG tablet, Take 1 tablet (180 mg total) by mouth daily., Disp: 30 tablet, Rfl: 0   fluticasone (FLONASE) 50 MCG/ACT nasal spray, Place 2 sprays into both nostrils daily., Disp: 16 g, Rfl: 6   guaiFENesin (MUCINEX) 600 MG 12 hr tablet, Take 2 tablets (1,200 mg total) by mouth 2 (two) times daily as needed.,  Disp: 30 tablet, Rfl: 0   lisinopril-hydrochlorothiazide (ZESTORETIC) 20-12.5 MG tablet, TAKE 1 TABLET BY MOUTH DAILY, Disp: 90 tablet, Rfl: 0   meloxicam (MOBIC) 15 MG tablet, Take 1 tablet (15 mg total) by mouth daily as needed for pain., Disp: 30 tablet, Rfl: 0   oxyCODONE (OXY IR/ROXICODONE) 5 MG immediate release tablet, Take 1 tablet (5 mg total) by mouth every 6 (six) hours as needed for severe pain (pain score 7-10)., Disp: 15 tablet, Rfl: 0   potassium chloride SA (KLOR-CON M) 20 MEQ tablet, Take 1 tablet (20 mEq total) by mouth 2 (two) times daily., Disp: 15 tablet, Rfl: 0   rosuvastatin (CRESTOR) 20 MG tablet, TAKE 1 TABLET(20 MG) BY MOUTH DAILY, Disp: 90 tablet, Rfl: 0  Allergies: Allergies  Allergen Reactions   Tea Anaphylaxis    Lenox Ponds, NP

## 2023-05-20 NOTE — ED Notes (Signed)
 Pt is IVC. Sitting on bedside with [employer] present. Pt was here earlier and was under IVC and eloped when he saw opportunity. Pt was brought back by [employer]. EDP Jeraldine Loots has spoken to patient. Charge nurse Asher Muir Blue-Matthews has called staffing for a sitter, none are available. Pt stating to police that he knows his rights and will leave again. Currently cooperative, non aggressive with [employer] at bedside. Raymar RN and Restaurant manager, fast food, nurses in the same pod aware pt is IVC with previous elopement today.

## 2023-05-21 ENCOUNTER — Other Ambulatory Visit: Payer: Self-pay

## 2023-05-21 ENCOUNTER — Encounter (HOSPITAL_COMMUNITY): Payer: Self-pay | Admitting: Nurse Practitioner

## 2023-05-21 ENCOUNTER — Inpatient Hospital Stay (HOSPITAL_COMMUNITY)
Admission: AD | Admit: 2023-05-21 | Discharge: 2023-05-24 | DRG: 885 | Disposition: A | Discharge status: Home / Self Care | Source: Intra-hospital | Attending: Psychiatry | Admitting: Psychiatry

## 2023-05-21 DIAGNOSIS — Z833 Family history of diabetes mellitus: Secondary | ICD-10-CM | POA: Diagnosis not present

## 2023-05-21 DIAGNOSIS — I10 Essential (primary) hypertension: Secondary | ICD-10-CM | POA: Diagnosis present

## 2023-05-21 DIAGNOSIS — Z79899 Other long term (current) drug therapy: Secondary | ICD-10-CM | POA: Diagnosis not present

## 2023-05-21 DIAGNOSIS — G4730 Sleep apnea, unspecified: Secondary | ICD-10-CM | POA: Diagnosis present

## 2023-05-21 DIAGNOSIS — T1491XA Suicide attempt, initial encounter: Principal | ICD-10-CM | POA: Diagnosis present

## 2023-05-21 DIAGNOSIS — E119 Type 2 diabetes mellitus without complications: Secondary | ICD-10-CM | POA: Diagnosis present

## 2023-05-21 DIAGNOSIS — Z7985 Long-term (current) use of injectable non-insulin antidiabetic drugs: Secondary | ICD-10-CM | POA: Diagnosis not present

## 2023-05-21 DIAGNOSIS — F17290 Nicotine dependence, other tobacco product, uncomplicated: Secondary | ICD-10-CM | POA: Diagnosis present

## 2023-05-21 DIAGNOSIS — Z8673 Personal history of transient ischemic attack (TIA), and cerebral infarction without residual deficits: Secondary | ICD-10-CM | POA: Diagnosis not present

## 2023-05-21 DIAGNOSIS — F322 Major depressive disorder, single episode, severe without psychotic features: Secondary | ICD-10-CM | POA: Diagnosis present

## 2023-05-21 DIAGNOSIS — Z9102 Food additives allergy status: Secondary | ICD-10-CM | POA: Diagnosis not present

## 2023-05-21 DIAGNOSIS — Z91014 Allergy to mammalian meats: Secondary | ICD-10-CM

## 2023-05-21 DIAGNOSIS — Z560 Unemployment, unspecified: Secondary | ICD-10-CM

## 2023-05-21 DIAGNOSIS — E7800 Pure hypercholesterolemia, unspecified: Secondary | ICD-10-CM | POA: Diagnosis present

## 2023-05-21 DIAGNOSIS — Z6841 Body Mass Index (BMI) 40.0 and over, adult: Secondary | ICD-10-CM | POA: Diagnosis not present

## 2023-05-21 DIAGNOSIS — E669 Obesity, unspecified: Secondary | ICD-10-CM | POA: Diagnosis present

## 2023-05-21 DIAGNOSIS — F332 Major depressive disorder, recurrent severe without psychotic features: Secondary | ICD-10-CM | POA: Diagnosis present

## 2023-05-21 LAB — CBG MONITORING, ED: Glucose-Capillary: 107 mg/dL — ABNORMAL HIGH (ref 70–99)

## 2023-05-21 MED ORDER — ROSUVASTATIN CALCIUM 20 MG PO TABS
20.0000 mg | ORAL_TABLET | Freq: Every day | ORAL | Status: DC
  Administered 2023-05-22 – 2023-05-24 (×3): 20 mg via ORAL
  Filled 2023-05-21 (×5): qty 1

## 2023-05-21 MED ORDER — HALOPERIDOL 5 MG PO TABS: 5.0000 mg | ORAL_TABLET | Freq: Three times a day (TID) | ORAL | Status: DC | PRN

## 2023-05-21 MED ORDER — AMLODIPINE BESYLATE 5 MG PO TABS
5.0000 mg | ORAL_TABLET | Freq: Every day | ORAL | Status: DC
  Administered 2023-05-22 – 2023-05-24 (×3): 5 mg via ORAL
  Filled 2023-05-21 (×5): qty 1

## 2023-05-21 MED ORDER — DULAGLUTIDE 3 MG/0.5ML ~~LOC~~ SOAJ: 3.0000 mg | SUBCUTANEOUS | Status: DC

## 2023-05-21 MED ORDER — LORAZEPAM 2 MG/ML IJ SOLN: 2.0000 mg | Freq: Three times a day (TID) | INTRAMUSCULAR | Status: DC | PRN

## 2023-05-21 MED ORDER — LISINOPRIL 20 MG PO TABS
20.0000 mg | ORAL_TABLET | Freq: Every day | ORAL | Status: DC
  Administered 2023-05-22 – 2023-05-24 (×3): 20 mg via ORAL
  Filled 2023-05-21 (×5): qty 1

## 2023-05-21 MED ORDER — DIPHENHYDRAMINE HCL 25 MG PO CAPS: 50.0000 mg | ORAL_CAPSULE | Freq: Three times a day (TID) | ORAL | Status: DC | PRN

## 2023-05-21 MED ORDER — POTASSIUM CHLORIDE CRYS ER 20 MEQ PO TBCR
20.0000 meq | EXTENDED_RELEASE_TABLET | Freq: Two times a day (BID) | ORAL | Status: DC
  Administered 2023-05-21 – 2023-05-24 (×6): 20 meq via ORAL
  Filled 2023-05-21 (×11): qty 1

## 2023-05-21 MED ORDER — ALUM & MAG HYDROXIDE-SIMETH 200-200-20 MG/5ML PO SUSP: 30.0000 mL | ORAL | Status: DC | PRN

## 2023-05-21 MED ORDER — DIPHENHYDRAMINE HCL 50 MG/ML IJ SOLN: 50.0000 mg | Freq: Three times a day (TID) | INTRAMUSCULAR | Status: DC | PRN

## 2023-05-21 MED ORDER — HALOPERIDOL LACTATE 5 MG/ML IJ SOLN: 10.0000 mg | Freq: Three times a day (TID) | INTRAMUSCULAR | Status: DC | PRN

## 2023-05-21 MED ORDER — HYDROXYZINE HCL 25 MG PO TABS
25.0000 mg | ORAL_TABLET | Freq: Three times a day (TID) | ORAL | Status: DC | PRN
  Administered 2023-05-24: 25 mg via ORAL
  Filled 2023-05-21: qty 1

## 2023-05-21 MED ORDER — HALOPERIDOL LACTATE 5 MG/ML IJ SOLN: 5.0000 mg | Freq: Three times a day (TID) | INTRAMUSCULAR | Status: DC | PRN

## 2023-05-21 MED ORDER — MAGNESIUM HYDROXIDE 400 MG/5ML PO SUSP: 30.0000 mL | Freq: Every day | ORAL | Status: DC | PRN

## 2023-05-21 MED ORDER — ALBUTEROL SULFATE (2.5 MG/3ML) 0.083% IN NEBU: 2.5000 mg | INHALATION_SOLUTION | Freq: Four times a day (QID) | RESPIRATORY_TRACT | Status: DC | PRN

## 2023-05-21 MED ORDER — TRAZODONE HCL 50 MG PO TABS
50.0000 mg | ORAL_TABLET | Freq: Every evening | ORAL | Status: DC | PRN
  Filled 2023-05-21: qty 1

## 2023-05-21 MED ORDER — ACETAMINOPHEN 325 MG PO TABS: 650.0000 mg | ORAL_TABLET | Freq: Four times a day (QID) | ORAL | Status: DC | PRN

## 2023-05-21 MED ORDER — HYDROCHLOROTHIAZIDE 12.5 MG PO TABS
12.5000 mg | ORAL_TABLET | Freq: Every day | ORAL | Status: DC
Start: 2023-05-22 — End: 2023-05-24
  Administered 2023-05-22 – 2023-05-24 (×3): 12.5 mg via ORAL
  Filled 2023-05-21 (×5): qty 1

## 2023-05-21 NOTE — ED Notes (Addendum)
 Patient inquired about getting weekly dose of Trulicity; EDP notified and order placed however medcation is not carried by [facility] and patient will have to provide of administration while in psy hold per [facility]. If patient has family to bring med it will need to be checked in with [facility] for dispensing while holding-Monique,RN

## 2023-05-21 NOTE — Progress Notes (Signed)
 Patient admitted, oriented to unit, reviewed rules and unit schedule, patient verbalized understanding. During intake patient verbalizes irritability, he states that he didn't mean to take too much medication, he was upset because "his girlfriend of many years was cheating" and recently found it out. Patient continues to reiterate the same story and that he doesn't belong here. Patient calmed down after talking and answering all of his questions about admission process. Patient taken to his room, patient is calm, denies needs at this time.    05/21/23 1530  Psych Admission Type (Psych Patients Only)  Admission Status Involuntary  Psychosocial Assessment  Patient Complaints Agitation;Anxiety  Eye Contact Brief  Facial Expression Angry;Anxious  Affect Irritable  Speech Logical/coherent  Interaction Assertive  Motor Activity Other (Comment) (WNL)  Appearance/Hygiene In scrubs  Behavior Characteristics Cooperative;Appropriate to situation;Anxious  Mood Anxious  Thought Process  Coherency WDL  Content Blaming others  Delusions None reported or observed  Perception WDL  Hallucination None reported or observed  Judgment Poor  Confusion None  Danger to Self  Current suicidal ideation? Denies  Danger to Others  Danger to Others None reported or observed

## 2023-05-21 NOTE — [facility] Group Notes (Signed)
 [facility] Group Notes:  (Nursing/MHT/Case Management/Adjunct)  Date:  05/21/2023  Time:  9:17 PM  Type of Therapy:  Psychoeducational Skills  Participation Level:  Did Not Attend  Participation Quality:  Resistant  Affect:  Resistant  Cognitive:  Lacking  Insight:  None  Engagement in Group:  None  Modes of Intervention:  Education  Summary of Progress/Problems: The patient did not attend group this evening.   Hazle Coca S 05/21/2023, 9:17 PM

## 2023-05-21 NOTE — ED Notes (Signed)
 Report was given at 1338 to Elwanda Brooklyn, RN at [facility].

## 2023-05-21 NOTE — ED Notes (Signed)
 IVC paperwork submit to court of clerk envelope [phone] case number was on the report but was not uploaded to the court of clerk it is now completed.

## 2023-05-21 NOTE — ED Provider Notes (Signed)
 Emergency Medicine Observation Re-evaluation Note  [name] [name] is a 43 y.o. male, seen on rounds today.  Pt initially presented to the ED for complaints of No chief complaint on file. Currently, the patient is asleep, did elope yesterday afternoon and was safely brought back to the ED under IVC. No other new concerns by nursing staff overnight.  Physical Exam  BP 129/87 (BP Location: Right Arm)   Pulse 67   Temp 97.6 F (36.4 C) (Oral)   Resp 20   SpO2 100%  Physical Exam General: Asleep, no acute distress Cardiac: Regular rate Lungs: No increased WOB Psych: Calm, asleep  ED Course / MDM  EKG:   I have reviewed the labs performed to date as well as medications administered while in observation.  Recent changes in the last 24 hours include patient remains medically cleared. Recommended for inpatient psych, pending accepting facility.  Plan  Current plan is for Inpatient psych.    Rexford Maus, DO 05/21/23 [phone]

## 2023-05-21 NOTE — Progress Notes (Signed)
 Pt has been accepted to [facility] on 05/21/2023 Bed assignment: 303-1  Pt meets inpatient criteria per: Arsenio Loader NP  Attending Physician will be: Dr. Sarita Bottom, MD   Report can be called to: Adult unit: [phone]  Pt can arrive after discharges   Care Team Notified: [facility] Rona Ravens RN, [facility] RN, Eligha Bridegroom NP, Lorella Nimrod RN, Devinny Harrington NT,     [place] Jeremy Mclamb LCSW-A   05/21/2023 10:41 AM

## 2023-05-21 NOTE — Plan of Care (Signed)

## 2023-05-22 ENCOUNTER — Encounter (HOSPITAL_COMMUNITY): Payer: Self-pay | Admitting: Nurse Practitioner

## 2023-05-22 DIAGNOSIS — F322 Major depressive disorder, single episode, severe without psychotic features: Secondary | ICD-10-CM | POA: Diagnosis not present

## 2023-05-22 NOTE — Group Note (Unsigned)
 Date:  05/22/2023 Time:  11:00 AM  Group Topic/Focus:  Goals Group:   The focus of this group is to help patients establish daily goals to achieve during treatment and discuss how the patient can incorporate goal setting into their daily lives to aide in recovery.     Participation Level:  {B[facility] PARTICIPATION ZOXWR:60454}  Participation Quality:  {BHH PARTICIPATION QUALITY:22265}  Affect:  {BHH AFFECT:22266}  Cognitive:  {BHH COGNITIVE:22267}  Insight: {BHH Insight2:20797}  Engagement in Group:  {BHH ENGAGEMENT IN UJWJX:91478}  Modes of Intervention:  {BHH MODES OF INTERVENTION:22269}  Additional Comments:  ***  [name] 05/22/2023, 11:00 AM

## 2023-05-22 NOTE — Group Note (Signed)
 Date:  05/22/2023 Time:  10:09 AM  Group Topic/Focus:  Goals Group:   The focus of this group is to help patients establish daily goals to achieve during treatment and discuss how the patient can incorporate goal setting into their daily lives to aide in recovery.    Participation Level:  Did Not Attend  Participation Quality:  Did Not Attend  Affect:  Did Not Attend  Cognitive:  Did Not Attend  Insight: None  Engagement in Group:  Did Not Attend  Modes of Intervention:  Did Not Attend  Additional Comments:    [name] 05/22/2023, 10:09 AM

## 2023-05-22 NOTE — Group Note (Signed)
 Recreation Therapy Group Note   Group Topic:Animal Assisted Therapy   Group Date: 05/22/2023 Start Time: 0946 End Time: 1030 Facilitators: Lloyde Ludlam-McCall, LRT,CTRS Location: [address] Dayroom   Animal-Assisted Activity (AAA) Program Checklist/Progress Notes Patient Eligibility Criteria Checklist & Daily Group note for Rec Tx Intervention  AAA/T Program Assumption of Risk Form signed by Patient/ or Parent Legal Guardian Yes  Patient understands his/her participation is voluntary Yes  Education: Charity fundraiser, Appropriate Animal Interaction   Education Outcome: Acknowledges education.    Affect/Mood: N/A   Participation Level: Did not attend    Clinical Observations/Individualized Feedback:     Plan: Continue to engage patient in RT group sessions 2-3x/week.   [name] [name], LRT,CTRS  05/22/2023 1:54 PM

## 2023-05-22 NOTE — Progress Notes (Signed)
   05/22/23 0800  Psych Admission Type (Psych Patients Only)  Admission Status Involuntary  Psychosocial Assessment  Patient Complaints Other (Comment) (Pt states that he did not try to commit suicide and does not belong here. Pt blames girlfriend had him IVC'd because she wanted to be ubfaithful.He minimizes and does not admit any suicidal statements during his drug/drinking.)  Eye Contact Brief  Facial Expression Flat  Affect Irritable;Preoccupied  Speech Logical/coherent  Interaction Assertive  Motor Activity Other (Comment) (WNL)  Appearance/Hygiene Unremarkable  Behavior Characteristics Appropriate to situation;Cooperative  Mood Apprehensive  Thought Process  Coherency WDL  Content Blaming others  Delusions None reported or observed  Perception WDL  Hallucination None reported or observed  Judgment Poor  Confusion None  Danger to Self  Current suicidal ideation? Denies  Agreement Not to Harm Self Yes  Description of Agreement verbal  Danger to Others  Danger to Others None reported or observed

## 2023-05-22 NOTE — Progress Notes (Signed)
   05/22/23 0300  Psych Admission Type (Psych Patients Only)  Admission Status Involuntary  Psychosocial Assessment  Patient Complaints Irritability;Other (Comment) (Pt reiterated that he did not try to commit suicide and does not belong here. Pt blames ex girlfriend who he reported he lives with was playing games and had him IVC'd. He minimizes and does not admit any suicidal statements during his drug/drinking.)  Eye Contact Brief  Facial Expression Other (Comment) (discontent)  Affect Irritable  Speech Logical/coherent  Interaction Assertive  Motor Activity  (Relaxed)  Appearance/Hygiene Unremarkable  Behavior Characteristics Appropriate to situation;Unwilling to participate (Declined to go to AA and minimized in light of drinking with xanax, "I don't have a problem with alcohol.")  Mood Other (Comment)  Thought Process  Coherency WDL  Content Blaming others  Delusions None reported or observed  Perception WDL  Hallucination None reported or observed  Judgment Impaired  Confusion None  Danger to Self  Current suicidal ideation? Denies  Agreement Not to Harm Self Yes  Description of Agreement Verbal  Danger to Others  Danger to Others None reported or observed

## 2023-05-22 NOTE — [facility] Counselor (Signed)
 Adult Comprehensive Assessment  Patient ID: [name] [name], male   DOB: 1980-08-20, 43 y.o.   MRN: [id number]  Information Source: Information source: Patient  Current Stressors:  Patient states their primary concerns and needs for treatment are:: "Nothing' Patient states their goals for this hospitilization and ongoing recovery are:: "I don't need to be here" Educational / Learning stressors: Denies Employment / Job issues: "I got fired from my job because of this" Family Relationships: Denies Surveyor, quantity / Lack of resources (include bankruptcy): Denies Housing / Lack of housing: Denies Physical health (include injuries & life threatening diseases): Denies Social relationships: Reports recent breakup with girlfriend of six years Substance abuse: Denies Bereavement / Loss: Recent loss of job due to [employer] layoffs  Living/Environment/Situation:  Living Arrangements: Spouse/significant other Living conditions (as described by patient or guardian): Pt stated that he now lives w/ "nobody" after discovering s/o has been unfaithful Who else lives in the home?: Previously w/ GF and GF's 45yo daugther How long has patient lived in current situation?: 6 years What is atmosphere in current home: Other (Comment)  Family History:  Marital status: Single Are you sexually active?: Yes What is your sexual orientation?: Heterosexual Has your sexual activity been affected by drugs, alcohol, medication, or emotional stress?: Denies Does patient have children?: Yes How many children?: 3 How is patient's relationship with their children?: WNL  Childhood History:  By whom was/is the patient raised?: Mother, Grandparents Additional childhood history information: WNL Description of patient's relationship with caregiver when they were a child: WNL Patient's description of current relationship with people who raised him/her: UTA How were you disciplined when you got in trouble as a child/adolescent?:  UTA Does patient have siblings?: Yes Did patient suffer any verbal/emotional/physical/sexual abuse as a child?: No Did patient suffer from severe childhood neglect?: No Has patient ever been sexually abused/assaulted/raped as an adolescent or adult?: No Was the patient ever a victim of a crime or a disaster?: No Witnessed domestic violence?: No Has patient been affected by domestic violence as an adult?: No  Education:  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?: No  Financial Resources:   Financial resources: Income from employment Does patient have a representative payee or guardian?: No  Alcohol/Substance Abuse:   What has been your use of drugs/alcohol within the last 12 months?: Denies abuse but admits to recreational use of alcohol and THC If attempted suicide, did drugs/alcohol play a role in this?: Yes (Denies that this admission is a suicide attmept) Alcohol/Substance Abuse Treatment Hx: Denies past history Has alcohol/substance abuse ever caused legal problems?: No  Social Support System:   Patient's Community Support System: Good Describe Community Support System: Reports having healthy friendships Type of faith/religion: DNA How does patient's faith help to  cope with current illness?: DNA  Leisure/Recreation:  Do You Have Hobbies?: Yes Leisure and Hobbies: smoke cigars, chilling with friends, active  Strengths/Needs:   What is the patient's perception of their strengths?: "everything" Patient states they can use these personal strengths during their treatment to contribute to their recovery: Denies Patient states these barriers may affect/interfere with their treatment: None Patient states these barriers may affect their return to the community: None Other important information patient would like considered in planning for their treatment: States he would like access to his cellular device in order to reschedule upcoming job interview  Discharge Plan:   Currently receiving community mental health services: Yes (From Whom) [facility]) Patient states concerns and preferences for aftercare planning are: Denies Patient states they will know when they are safe and ready for discharge when: Feels that admission is not necessary, feeling safe for discharge Does patient have access to transportation?: Yes Does patient have financial barriers related to discharge medications?: No Will patient be returning to same living situation after discharge?: Yes  Summary/Recommendations:   Summary and Recommendations (to be completed by the evaluator): [name] [name] "[name] [name]" Bristol is a 43yo AA male admitted to [facility] via IVC after pereceived intentional overdose on his prescription medications. Prior to admission, [name] [name]  admits to mixing alcohol with two oxycodones and a muscle relaxer. This further resulted in Pt being found unconcious, requiring Narcan intervention and later IVC'd. He adamantly denies that this was a sucidie attempt, despite recent signifcant stressors. [name] [name] states he lost his job in the last 6 months due to [name] [name] and he recently ended relationship with GF of 6 years due to discovering her infidelity, both risk factors for  suicide. He reports no prior history of inpatient mental health treatment but has engaged in therapy. He reports currently being active in therapy and planning to reamin engage post discharge.While here, [name] [name] can benefit from crisis stabilization, medication management, therapeutic milieu, and referrals for services.   [name] Shoe, LCSW. 05/22/2023 5:52 PM

## 2023-05-22 NOTE — Group Note (Signed)
 LCSW Group Therapy Note   Group Date: 05/22/2023 Start Time: 1100 End Time: 1200   Participation:  did not attend   Type of Therapy:  Group Therapy  Title:  "Shining from Within: Confidence and Self-Love Journey"  Objective:  The focus of today's session is to explore how confidence and self-love can be nurtured over time through self-compassion, recognizing strengths, and taking small, intentional steps.  Goals: Foster self-love and acceptance by embracing strengths and imperfections. Develop confidence through actionable steps and mindset shifts. Practice patience during personal growth, acknowledging setbacks as part of the journey.  Summary:  This session focused on self-love as the foundation for confidence.  Participants practiced helpful self-talk, identified strengths, set small goals, and reflected on achievements and social support.  It emphasized that building confidence is a continuous process requiring patience and self-care.  Therapeutic Modalities: Cognitive Behavioral Therapy (CBT): Used to challenge and replace unhelpful self-talk with more supportive thoughts, enhancing self-esteem. Mindfulness and Self-Compassion Practices: Encourages reflection on strengths, gratitude, and the creation of a positive environment to foster a sense of well-being.   Alla Feeling, LCSWA 05/22/2023  12:49 PM

## 2023-05-22 NOTE — H&P (Signed)
 Psychiatric Admission Assessment Adult  Patient Identification: [name] [name] MRN:  [id number] Date of Evaluation:  05/22/2023 Chief Complaint:  Suicide attempt [facility]) [T14.91XA] Principal Diagnosis: Severe major depression, single episode, without psychotic features (HCC) Diagnosis:  Principal Problem:   Severe major depression, single episode, without psychotic features (HCC) Active Problems:   Suicide attempt [facility])  History of Present Illness:  43 year old American male, divorced, part-time employed, cohabitates with his ex-girlfriend.  No formal history of any mental illness.  Patient presented involuntarily via EMS.  Patient was found unconscious in his car.  They have empty bottles of denied using, oxycodone and naproxen by his side.  They also found alcohol by his side.  He was reported to have sent a goodbye text message to family members. Patient was managed medically and referred to our services for stabilization.  Chart reviewed today.  Patient discussed at multidisciplinary team meeting.  Nursing staff reports that patient has not had any challenging behavior on the unit.  He has been taking his medical medications.  I met with the patient today.  He minimizes recent overdose stating that he was slightly drunk.  Repeatedly dismissed any intent to end his life and simply down the text message that he sent to his brother.  States that when he said he did not want to be "here anymore", he meant the apartment that he is sharing with his ex-girlfriend.  Patient states that he had been in a relationship with ex-girlfriend for about 6 years.  States that in November 2024, he found out that she was having an affair.  States that he decided to end the relationship himself.  They are both on the lease on the lease is not due until November 2025.  Patient reports attachments to ex-girlfriend's 50 year old child.  Dismissed the idea of killing himself because of that failed relationship.   Patient also states that he used to work in a [employer] account executive.  States that he was laid off in February.  He has been doing temporary jobs.  Patient states that he has a new position which he found out about on Monday.  States that interview is coming up soon and he wants to be out of the hospital to have this interview.  Patient dismissed feeling depressed following his break-up.  He tells me that he has three grown children who are doing well and two grandchildren.  States he had no intention of hurting them emotionally.  Patient states that he does not drink typically on a regular basis.  He dismissed use of any psychoactive substance.  Patient does not feel like he needs to be here.  Asked me repeatedly what he had to do so as to be discharged today.  Importance of maintaining his safety and safety of others restated to him.  He has agreed to collateral from his brother whom he sent the text message to.  Brother's name is Francis Dowse, phone #[phone].  No evidence of pervasive depression.  No evidence of overwhelming anxiety.  No evidence of mania.  No evidence of psychosis.  No evidence of PTSD.  No evidence of OCD.  Review of symptoms essentially as above.  Total Time spent with patient: 45 minutes  Past Psychiatric History:  No past history of mental illness.  This is patient's first inpatient hospitalization. Patient is nave to psychotropic medication. No past history of self-injurious behavior.  No past suicidal attempts prior to this overdose.  No past history of violent behavior.  No past history  of mania.  No past history of psychosis. Patient minimized his alcohol use.  He dismissed use of any other psychoactive substance.  No past rehab treatment. No past history of neuromodulation. Patient is not in therapy.   [place] Scale:  Flowsheet Row Ad[facility] (Current) [facility] from 05/20/2023 in [facility] Department at [facility] ED from 02/21/2023 in [facility] Emergency Department at [facility]  C-SSRS RISK CATEGORY No Risk No Risk No Risk         Alcohol Screening: 1. How often do you have a drink containing alcohol?: Monthly or less 2. How many drinks containing alcohol do you have on a typical day when you are drinking?: 1 or 2 3. How often do you have six or more drinks on one occasion?: Never AUDIT-C Score: 1 4. How often during the last year have you found that you were not able to stop drinking once you had started?: Never 5. How often during the last year have you failed to do what was normally expected from you because of drinking?: Never 6. How often during the last year have you needed a first drink in the morning to get yourself going after a heavy drinking session?: Never 7. How often during the last year have you had a feeling of guilt of remorse after drinking?: Never 8. How often during the last year have you been unable to remember what happened the night before because you had been drinking?: Never 9. Have you or someone else been injured as a result of your drinking?: No 10. Has a relative or friend or a doctor or another health worker been concerned about your drinking or suggested you cut down?: No Alcohol Use Disorder Identification Test Final Score (AUDIT): 1 Alcohol Brief Interventions/Follow-up: Alcohol education/Brief advice  Past Medical History:  Past Medical History:  Diagnosis Date   Arthritis    Diabetes mellitus without complication (HCC)    Type 2   Hypercholesteremia    Hypertension    Pneumonia    Dec. 2023   Sleep apnea    Stroke (HCC) 2012   Bell's Palsy Deficit. Now resolved   TIA (transient ischemic attack) 2012    Past Surgical History:  Procedure Laterality Date   FOOT SURGERY Left 2004   KNEE SURGERY Left 2004   ORIF RADIUS & ULNA FRACTURES Left 2004   ORIF TIBIA & FIBULA FRACTURES Left 2004   ORTHOPEDIC SURGERY     PILONIDAL CYST  EXCISION N/A 06/29/2022   Procedure: CYST EXCISION PILONIDAL EXTENSIVE;  Surgeon: Harriette Bouillon, MD;  Location: [facility] OR;  Service: General;  Laterality: N/A;   SHOULDER SURGERY     Family History:  Family History  Problem Relation Age of Onset   Diabetes Mother    Healthy Father    Family Psychiatric  History:  No family history of mental illness.  No family history of suicide.  No family history of addiction. No family history of sudden cardiac death.  Tobacco Screening:  Social History   Tobacco Use  Smoking Status Some Days   Types: Cigars  Smokeless Tobacco Never    BH Tobacco Counseling     Are you interested in Tobacco Cessation Medications?  No value filed. Counseled patient on smoking cessation:  No value filed. Reason Tobacco Screening Not Completed: No value filed.       Social History:  Social History   Substance and Sexual Activity  Alcohol Use Yes   Comment: occasionally     Social History   Substance and Sexual Activity  Drug Use No   Comment: UDS positive histocially    Additional Social History: Patient was raised by his maternal grandmother and mother.  He reports a good childhood.  He was well adjusted at school.  Patient has two bachelor's degree.  He was married once for 6 years.  They have three grown children and two grand children together.  He was not his last relationship for six months. No forensic history.  No military experience.  Reports good support from his children and the rest of his family.  Allergies:   Allergies  Allergen Reactions   Tea Anaphylaxis   Pork-Derived Products     Patient said he does not eat pork   Lab Results:  Results for orders placed or performed during the hospital encounter of 05/20/23 (from the past 48 hours)  Rapid urine drug screen (hospital performed)     Status: Abnormal   Collection Time: 05/20/23  6:20 PM  Result Value Ref Range   Opiates NONE DETECTED NONE DETECTED   Cocaine NONE DETECTED NONE  DETECTED   Benzodiazepines NONE DETECTED NONE DETECTED   Amphetamines NONE DETECTED NONE DETECTED   Tetrahydrocannabinol POSITIVE (A) NONE DETECTED   Barbiturates NONE DETECTED NONE DETECTED    Comment: (NOTE) DRUG SCREEN FOR MEDICAL PURPOSES ONLY.  IF CONFIRMATION IS NEEDED FOR ANY PURPOSE, NOTIFY LAB WITHIN 5 DAYS.  LOWEST DETECTABLE LIMITS FOR URINE DRUG SCREEN Drug Class                     Cutoff (ng/mL) Amphetamine and metabolites    1000 Barbiturate and metabolites    200 Benzodiazepine                 200 Opiates and metabolites        300 Cocaine and metabolites        300 THC                            50 Performed at [facility], [address] [place], [place] [address]   CBG monitoring, ED     Status: Abnormal   Collection Time: 05/21/23 11:05 AM  Result Value Ref Range   Glucose-Capillary 107 (H) 70 - 99 mg/dL    Comment: Glucose reference range applies only to samples taken after fasting for at least 8 hours.    Blood Alcohol level:  Lab Results  Component Value Date   ETH 15 (H) 05/20/2023   ETH <5 08/22/2016    Metabolic Disorder Labs:  Lab Results  Component Value Date   HGBA1C 6.8 (H) 02/15/2023   MPG 134 (H) 07/20/2010   No results found for: "PROLACTIN" Lab Results  Component Value Date   CHOL 121 05/17/2022   TRIG 147.0 05/17/2022   HDL 32.20 (L) 05/17/2022   CHOLHDL 4 05/17/2022   VLDL 29.4 05/17/2022   LDLCALC 60 05/17/2022   LDLCALC 88 02/15/2022    Current Medications: Current Facility-Administered Medications  Medication Dose Route Frequency Provider Last Rate Last Admin   acetaminophen (TYLENOL) tablet 650 mg  650 mg Oral Q6H PRN Eligha Bridegroom, NP       albuterol (PROVENTIL) (2.5 MG/3ML) 0.083% nebulizer solution 2.5 mg  2.5 mg Nebulization Q6H PRN Eligha Bridegroom, NP       alum & mag hydroxide-simeth (MAALOX/MYLANTA)  200-200-20 MG/5ML suspension 30 mL  30 mL Oral Q4H PRN Eligha Bridegroom, NP       amLODipine  (NORVASC) tablet 5 mg  5 mg Oral Daily Eligha Bridegroom, NP   5 mg at 05/22/23 0754   haloperidol (HALDOL) tablet 5 mg  5 mg Oral TID PRN Eligha Bridegroom, NP       And   diphenhydrAMINE (BENADRYL) capsule 50 mg  50 mg Oral TID PRN Eligha Bridegroom, NP       haloperidol lactate (HALDOL) injection 5 mg  5 mg Intramuscular TID PRN Eligha Bridegroom, NP       And   diphenhydrAMINE (BENADRYL) injection 50 mg  50 mg Intramuscular TID PRN Eligha Bridegroom, NP       And   LORazepam (ATIVAN) injection 2 mg  2 mg Intramuscular TID PRN Eligha Bridegroom, NP       haloperidol lactate (HALDOL) injection 10 mg  10 mg Intramuscular TID PRN Eligha Bridegroom, NP       And   diphenhydrAMINE (BENADRYL) injection 50 mg  50 mg Intramuscular TID PRN Eligha Bridegroom, NP       And   LORazepam (ATIVAN) injection 2 mg  2 mg Intramuscular TID PRN Eligha Bridegroom, NP       lisinopril (ZESTRIL) tablet 20 mg  20 mg Oral Daily Eligha Bridegroom, NP   20 mg at 05/22/23 0754   And   hydrochlorothiazide (HYDRODIURIL) tablet 12.5 mg  12.5 mg Oral Daily Eligha Bridegroom, NP   12.5 mg at 05/22/23 0754   hydrOXYzine (ATARAX) tablet 25 mg  25 mg Oral TID PRN Eligha Bridegroom, NP       magnesium hydroxide (MILK OF MAGNESIA) suspension 30 mL  30 mL Oral Daily PRN Eligha Bridegroom, NP       potassium chloride SA (KLOR-CON M) CR tablet 20 mEq  20 mEq Oral BID Eligha Bridegroom, NP   20 mEq at 05/22/23 0755   rosuvastatin (CRESTOR) tablet 20 mg  20 mg Oral Daily Eligha Bridegroom, NP   20 mg at 05/22/23 0754   traZODone (DESYREL) tablet 50 mg  50 mg Oral QHS PRN Eligha Bridegroom, NP       PTA Medications: Medications Prior to Admission  Medication Sig Dispense Refill Last Dose/Taking   albuterol (VENTOLIN HFA) 108 (90 Base) MCG/ACT inhaler Inhale 1-2 puffs into the lungs every 6 (six) hours as needed for wheezing or shortness of breath. 6.7 g 0    amLODipine (NORVASC) 5 MG tablet TAKE 1 TABLET(5 MG) BY MOUTH DAILY 90 tablet 0     blood glucose meter kit and supplies KIT Dispense based on patient and insurance preference. Use up to four times daily as directed. Please include lancets, test strips, control solution. 1 each 0    Dulaglutide (TRULICITY) 3 MG/0.5ML SOAJ Inject 3 mg into the skin once a week. 6 mL 1    fexofenadine (ALLEGRA) 180 MG tablet Take 1 tablet (180 mg total) by mouth daily. 30 tablet 0    fluticasone (FLONASE) 50 MCG/ACT nasal spray Place 2 sprays into both nostrils daily. 16 g 6    guaiFENesin (MUCINEX) 600 MG 12 hr tablet Take 2 tablets (1,200 mg total) by mouth 2 (two) times daily as needed. (Patient not taking: Reported on 05/20/2023) 30 tablet 0    lisinopril-hydrochlorothiazide (ZESTORETIC) 20-12.5 MG tablet TAKE 1 TABLET BY MOUTH DAILY 90 tablet 0    meloxicam (MOBIC) 15 MG tablet Take 1 tablet (15 mg total) by  mouth daily as needed for pain. (Patient not taking: Reported on 05/20/2023) 30 tablet 0    oxyCODONE (OXY IR/ROXICODONE) 5 MG immediate release tablet Take 1 tablet (5 mg total) by mouth every 6 (six) hours as needed for severe pain (pain score 7-10). (Patient not taking: Reported on 05/20/2023) 15 tablet 0    potassium chloride SA (KLOR-CON M) 20 MEQ tablet Take 1 tablet (20 mEq total) by mouth 2 (two) times daily. 15 tablet 0    rosuvastatin (CRESTOR) 20 MG tablet TAKE 1 TABLET(20 MG) BY MOUTH DAILY 90 tablet 0     Musculoskeletal: Strength & Muscle Tone: within normal limits Gait & Station: normal Patient leans: N/A  Psychiatric Specialty Exam:  Presentation  General Appearance:  Obese, not in any distress, moderate rapport.  Eye Contact: Good eye contact.  Speech: Spontaneous.  Normal rate, tone and volume.  Mood and Affect  Mood: Subjectively denies being depressed.  Not pervasively depressed.  Affect: Restricted.   Thought Process  Thought Processes: Normal speed of thought.  Linear and goal directed.   Descriptions of Associations: Intact.  Orientation: Fully  oriented to self place and person.  Thought Content: Minimizes recent suicidal attempt.  Dismissive of any lingering suicidal thoughts.  No homicidal thoughts.  No thoughts of violence.  No delusional preoccupation.  Focused on going for job interview.  Hallucinations: No hallucination in any modality.   Sensorium  Memory: Good.  Judgment: Fair.  Insight: Limited as patient is dismissive of any mental illness.  He does not want to take medication.  Executive Functions  Concentration: Good.  Attention Span: Good.  Recall: Good.  Fund of Knowledge: Fair.  Language: Good.  Psychomotor Activity  Normal psychomotor activity.  Physical Exam: Physical Exam Constitutional:      Appearance: Normal appearance. He is obese.  HENT:     Head: Normocephalic and atraumatic.     Nose: Nose normal.     Mouth/Throat:     Mouth: Mucous membranes are moist.  Eyes:     Extraocular Movements: Extraocular movements intact.     Pupils: Pupils are equal, round, and reactive to light.  Cardiovascular:     Rate and Rhythm: Normal rate and regular rhythm.  Pulmonary:     Effort: Pulmonary effort is normal.  Abdominal:     General: Abdomen is flat.  Musculoskeletal:        General: Normal range of motion.  Skin:    General: Skin is warm and dry.  Neurological:     Mental Status: He is alert and oriented to person, place, and time.    Review of Systems  Constitutional: Negative.   HENT: Negative.    Eyes: Negative.   Cardiovascular: Negative.   Gastrointestinal: Negative.   Genitourinary: Negative.   Musculoskeletal: Negative.   Skin: Negative.   Neurological: Negative.   Endo/Heme/Allergies: Negative.    Blood pressure (!) 148/94, pulse 71, temperature 98 F (36.7 C), temperature source Oral, resp. rate 16, height 6\' 4"  (1.93 m), weight (!) 165.6 kg, SpO2 100%. Body mass index is 44.43 kg/m.  Treatment Plan Summary: 43 year old divorced African-American male who  recently lost his job and his relationship.  He was found unconscious in his car after being tipped off by his family whom he sent a goodbye text message to.  He is minimizing his stressors and recent suicidal attempt.  Patient is in a flight to health as he does not want to take any medication and does not want  to be kept in the hospital.  There is no overt psychopathology.  We will gather collateral and evaluate him further.  We will maintain IVC.  Observation Level/Precautions:  15 minute checks  Laboratory:   No acute labs needed.  Psychotherapy: Patient will participate with unit groups and therapeutic activities.  Medications:   We will continue home medical medications.  Consultations:    Discharge Concerns:    Estimated LOS: 4 to 7 days.  Other: Social worker will get collateral from his family.   Physician Treatment Plan for Primary Diagnosis: Severe major depression, single episode, without psychotic features (HCC) Long Term Goal(s): Improvement in symptoms so as ready for discharge  Short Term Goals: Ability to identify triggers associated with substance abuse/mental health issues will improve  Physician Treatment Plan for Secondary Diagnosis: Principal Problem:   Severe major depression, single episode, without psychotic features (HCC) Active Problems:   Suicide attempt (HCC)  Long Term Goal(s): Improvement in symptoms so as ready for discharge  Short Term Goals: Ability to identify triggers associated with substance abuse/mental health issues will improve  I certify that inpatient services furnished can reasonably be expected to improve the patient's condition.    Georgiann Cocker, MD 4/8/20251:49 PM

## 2023-05-22 NOTE — Plan of Care (Signed)
  Problem: Education: Goal: Knowledge of [place] [facility] Education information/materials will improve Outcome: Completed/Met Goal: Emotional status will improve Outcome: Progressing Goal: Mental status will improve Outcome: Progressing Goal: Verbalization of understanding the information provided will improve Outcome: Progressing   Problem: Activity: Goal: Interest or engagement in activities will improve Outcome: Progressing Goal: Sleeping patterns will improve Outcome: Progressing

## 2023-05-22 NOTE — Group Note (Unsigned)
 Date:  05/22/2023 Time:  10:07 AM  Group Topic/Focus:  Goals Group:   The focus of this group is to help patients establish daily goals to achieve during treatment and discuss how the patient can incorporate goal setting into their daily lives to aide in recovery.     Participation Level:  {BHH PARTICIPATION ZOXWR:60454}  Participation Quality:  {BHH PARTICIPATION QUALITY:22265}  Affect:  {BHH AFFECT:22266}  Cognitive:  {BHH COGNITIVE:22267}  Insight: {BHH Insight2:20797}  Engagement in Group:  {BHH ENGAGEMENT IN UJWJX:91478}  Modes of Intervention:  {BHH MODES OF INTERVENTION:22269}  Additional Comments:  ***  [name] 05/22/2023, 10:07 AM

## 2023-05-22 NOTE — Group Note (Unsigned)
 Date:  05/22/2023 Time:  10:45 AM  Group Topic/Focus:  Goals Group:   The focus of this group is to help patients establish daily goals to achieve during treatment and discuss how the patient can incorporate goal setting into their daily lives to aide in recovery.     Participation Level:  {BHH PARTICIPATION ZOXWR:60454}  Participation Quality:  {BHH PARTICIPATION QUALITY:22265}  Affect:  {BHH AFFECT:22266}  Cognitive:  {BHH COGNITIVE:22267}  Insight: {BHH Insight2:20797}  Engagement in Group:  {BHH ENGAGEMENT IN UJWJX:91478}  Modes of Intervention:  {BHH MODES OF INTERVENTION:22269}  Additional Comments:  ***  [name] 05/22/2023, 10:45 AM

## 2023-05-22 NOTE — Plan of Care (Signed)
?  Problem: Education: ?Goal: Knowledge of [place] [facility] Education information/materials will improve ?Outcome: Progressing ?Goal: Emotional status will improve ?Outcome: Progressing ?  ?Problem: Safety: ?Goal: Periods of time without injury will increase ?Outcome: Progressing ?  ?

## 2023-05-22 NOTE — Group Note (Unsigned)
 Date:  05/22/2023 Time:  10:53 AM  Group Topic/Focus:  Goals Group:   The focus of this group is to help patients establish daily goals to achieve during treatment and discuss how the patient can incorporate goal setting into their daily lives to aide in recovery.     Participation Level:  {BHH PARTICIPATION LKGMW:10272}  Participation Quality:  {BHH PARTICIPATION QUALITY:22265}  Affect:  {BHH AFFECT:22266}  Cognitive:  {BHH COGNITIVE:22267}  Insight: {BHH Insight2:20797}  Engagement in Group:  {BHH ENGAGEMENT IN ZDGUY:40347}  Modes of Intervention:  {BHH MODES OF INTERVENTION:22269}  Additional Comments:  ***  [name] 05/22/2023, 10:53 AM

## 2023-05-22 NOTE — Group Note (Unsigned)
 Date:  05/22/2023 Time:  10:16 AM  Group Topic/Focus:  Goals Group:   The focus of this group is to help patients establish daily goals to achieve during treatment and discuss how the patient can incorporate goal setting into their daily lives to aide in recovery.     Participation Level:  {BHH PARTICIPATION ZOXWR:60454}  Participation Quality:  {BHH PARTICIPATION QUALITY:22265}  Affect:  {BHH AFFECT:22266}  Cognitive:  {BHH COGNITIVE:22267}  Insight: {BHH Insight2:20797}  Engagement in Group:  {BHH ENGAGEMENT IN UJWJX:91478}  Modes of Intervention:  {BHH MODES OF INTERVENTION:22269}  Additional Comments:  ***  [name] [name] 05/22/2023, 10:16 AM

## 2023-05-22 NOTE — BHH Suicide Risk Assessment (Signed)
 [facility] Admission Suicide Risk Assessment   Nursing information obtained from:  Patient Demographic factors:  Male Current Mental Status:  Suicidal ideation indicated by others Loss Factors:  Loss of significant relationship Historical Factors:  NA Risk Reduction Factors:  Employed  Total Time spent with patient: 30 minutes Principal Problem: Suicide attempt [facility]) Diagnosis:  Principal Problem:   Suicide attempt [facility] [address])  Subjective Data:  43 year old African-American male, divorced, part-time employment, lives with partner but not a 59 year old child.  No family history of any mental illness.  Presented involuntarily following a serious overdose.  Reported to have left a suicide note via text message to family members.  Required Narcan twice on his way to the hospital.  Continued Clinical Symptoms:  Alcohol Use Disorder Identification Test Final Score (AUDIT): 1 The "Alcohol Use Disorders Identification Test", Guidelines for Use in Primary Care, Second Edition.  World Science writer [facility]). Score between 0-7:  no or low risk or alcohol related problems. Score between 8-15:  moderate risk of alcohol related problems. Score between 16-19:  high risk of alcohol related problems. Score 20 or above:  warrants further diagnostic evaluation for alcohol dependence and treatment.   CLINICAL FACTORS:   Depression:   Comorbid alcohol abuse/dependence Impulsivity   Musculoskeletal: Strength & Muscle Tone: within normal limits Gait & Station: normal Patient leans: N/A  Psychiatric Specialty Exam:  Presentation  General Appearance: Obese, not in any distress, moderate rapport.  Eye Contact: Good eye contact.  Speech: Spontaneous.  Normal rate, tone and volume.  Mood and Affect  Mood: Subjectively denies being depressed.  Not pervasively depressed.  Affect: Restricted.   Thought Process  Thought Processes: Normal speed of thought.  Linear and goal directed.   Descriptions  of Associations: Intact.  Orientation: Fully oriented to self place and person.  Thought Content: Minimizes recent suicidal attempt.  Dismissive of any lingering suicidal thoughts.  No homicidal thoughts.  No thoughts of violence.  No delusional preoccupation.  Focused on going for job interview.  Hallucinations: No hallucination in any modality.   Sensorium  Memory: Good.  Judgment: Fair.  Insight: Limited as patient is dismissive of any mental illness.  He does not want to take medication.  Executive Functions  Concentration: Good.  Attention Span: Good.  Recall: Good.  Fund of Knowledge: Fair.  Language: Good.  Psychomotor Activity  Normal psychomotor activity.   Physical Exam: Physical Exam ROS Blood pressure (!) 148/94, pulse 71, temperature 98 F (36.7 C), temperature source Oral, resp. rate 16, height 6\' 4"  (1.93 m), weight (!) 165.6 kg, SpO2 100%. Body mass index is 44.43 kg/m.   COGNITIVE FEATURES THAT CONTRIBUTE TO RISK:  Polarized thinking    SUICIDE RISK:   Moderate:  Frequent suicidal ideation with limited intensity, and duration, some specificity in terms of plans, no associated intent, good self-control, limited dysphoria/symptomatology, some risk factors present, and identifiable protective factors, including available and accessible social support.  PLAN OF CARE: Patient will be put on every 15 minute check.  We will uphold IVC.  We will gather collateral and explore treatment options with him.  I certify that inpatient services furnished can reasonably be expected to improve the patient's condition.   Georgiann Cocker, MD 05/22/2023, 1:15 PM

## 2023-05-23 ENCOUNTER — Encounter (HOSPITAL_COMMUNITY): Payer: Self-pay

## 2023-05-23 DIAGNOSIS — F322 Major depressive disorder, single episode, severe without psychotic features: Secondary | ICD-10-CM | POA: Diagnosis not present

## 2023-05-23 MED ORDER — WHITE PETROLATUM EX OINT
TOPICAL_OINTMENT | CUTANEOUS | Status: AC
Start: 2023-05-23 — End: 2023-05-23
  Administered 2023-05-23: 1
  Filled 2023-05-23: qty 5

## 2023-05-23 NOTE — Progress Notes (Signed)
 Patient ID: SAMIL MECHAM, male   DOB: 08-17-1980, 43 y.o.   MRN: [id number] CSW with assistance of security, obtained Pt's cellular device. Met with Pt individually on the unit. He did screenshot text messages that were sent to his brother and further sent them via email to this CSW. When asked about messages sent to former GF, he reports deleting them. He admits to being upset with her feeling she completed the IVC. (Did investigate further and informed Pt that he was IVC'd by ED provider)  Made Pt aware that multiple calls had been made to his friend Joey, however he was unable to be contacted and no VM could be left. Discussed other possible SPE contains as we await getting in contact with Joey. With Pt present, spoke with his friend Adult nurse. He was upset and disappointed to hear that Pt was admitted. He stated multiple times that Trey Paula "is not crazy" as well as providing Pt with words of encouragement. CSW will continue to make efforts to reach Pt's brother.    CSW team will continue to follow.   Joelyn Oms [place], LCSW 05/23/23 7:41 PM

## 2023-05-23 NOTE — Plan of Care (Signed)
   Problem: Education: Goal: Mental status will improve Outcome: Progressing Goal: Verbalization of understanding the information provided will improve Outcome: Progressing   Problem: Activity: Goal: Interest or engagement in activities will improve Outcome: Progressing Goal: Sleeping patterns will improve Outcome: Progressing   Problem: Coping: Goal: Ability to verbalize frustrations and anger appropriately will improve Outcome: Progressing Goal: Ability to demonstrate self-control will improve Outcome: Progressing

## 2023-05-23 NOTE — Group Note (Signed)
 Recreation Therapy Group Note   Group Topic:Team Building  Group Date: 05/23/2023 Start Time: 0935 End Time: 1000 Facilitators: Leena Tiede-McCall, LRT,CTRS Location: [address] Dayroom   Group Topic: Communication, Team Building, Problem Solving  Goal Area(s) Addresses:  Patient will effectively work with peer towards shared goal.  Patient will identify skills used to make activity successful.  Patient will identify how skills used during activity can be used to reach post d/c goals.   Intervention: STEM Activity  Activity: Straw Bridge. In teams of 3-5, patients were given 15 plastic drinking straws and an equal length of masking tape. Using the materials provided, patients were instructed to build a free standing bridge-like structure to suspend an everyday item (ex: puzzle box) off of the floor or table surface. All materials were required to be used by the team in their design. LRT facilitated post-activity discussion reviewing team process. Patients were encouraged to reflect how the skills used in this activity can be generalized to daily life post discharge.   Education: Pharmacist, community, Scientist, physiological, Discharge Planning   Education Outcome: Acknowledges education/In group clarification offered/Needs additional education.    Affect/Mood: Appropriate   Participation Level: Engaged   Participation Quality: Independent   Behavior: Appropriate   Speech/Thought Process: Focused   Insight: Good   Judgement: Good   Modes of Intervention: STEM Activity   Patient Response to Interventions:  Engaged   Education Outcome:  In group clarification offered    Clinical Observations/Individualized Feedback: Pt took on the lead within his group. Pt was bright and engaged during group. Pt was also social with peers as well during group.      Plan: Continue to engage patient in RT group sessions 2-3x/week.   Darya Bigler-McCall, LRT,CTRS  05/23/2023 12:10 PM

## 2023-05-23 NOTE — Progress Notes (Signed)
   05/22/23 2155  Psych Admission Type (Psych Patients Only)  Admission Status Involuntary  Psychosocial Assessment  Patient Complaints None  Eye Contact Fair  Facial Expression Animated  Affect Preoccupied  Speech Logical/coherent  Interaction Assertive  Motor Activity Other (Comment) (WDL)  Appearance/Hygiene Unremarkable  Behavior Characteristics Cooperative  Mood Anxious;Irritable  Thought Process  Coherency WDL  Content Blaming others  Delusions None reported or observed  Perception WDL  Hallucination None reported or observed  Judgment Poor  Confusion None  Danger to Self  Current suicidal ideation? Denies  Self-Injurious Behavior No self-injurious ideation or behavior indicators observed or expressed   Agreement Not to Harm Self Yes  Description of Agreement verbal  Danger to Others  Danger to Others None reported or observed

## 2023-05-23 NOTE — BHH Group Notes (Signed)
 B[facility] Group Notes:  (Nursing/MHT/Case Management/Adjunct)  Date:  05/23/2023  Time:  4:56 AM  Type of Therapy:   Wrap-up group  Participation Level:  Active  Participation Quality:  Appropriate  Affect:  Appropriate  Cognitive:  Appropriate  Insight:  Appropriate  Engagement in Group:  Engaged  Modes of Intervention:  Education  Summary of Progress/Problems: Goal stay calm. Met goal. Rated day 6/10.   [name] [name] 05/23/2023, 4:56 AM

## 2023-05-23 NOTE — Group Note (Signed)
 Date:  05/23/2023 Time:  9:19 AM  Group Topic/Focus:  Goals Group:   The focus of this group is to help patients establish daily goals to achieve during treatment and discuss how the patient can incorporate goal setting into their daily lives to aide in recovery.    Participation Level:  Active  Participation Quality:  Appropriate  Affect:  Appropriate   [name] Score 05/23/2023, 9:19 AM

## 2023-05-23 NOTE — Progress Notes (Signed)
   05/23/23 0900  Psych Admission Type (Psych Patients Only)  Admission Status Involuntary  Psychosocial Assessment  Patient Complaints None  Eye Contact Brief  Facial Expression Flat  Affect Preoccupied  Speech Logical/coherent  Interaction Assertive  Motor Activity Other (Comment) (WNL)  Appearance/Hygiene Unremarkable  Behavior Characteristics Cooperative  Mood Irritable  Thought Process  Coherency WDL  Content Blaming others  Delusions None reported or observed  Perception WDL  Hallucination None reported or observed  Judgment Limited  Confusion None  Danger to Self  Current suicidal ideation? Denies  Self-Injurious Behavior No self-injurious ideation or behavior indicators observed or expressed   Agreement Not to Harm Self Yes  Description of Agreement verbal  Danger to Others  Danger to Others None reported or observed

## 2023-05-23 NOTE — BH IP Treatment Plan (Signed)
 Interdisciplinary Treatment and Diagnostic Plan Update  05/23/2023 Time of Session: 1135am BATES [place] MRN: [id number]  Principal Diagnosis: Severe major depression, single episode, without psychotic features (HCC)  Secondary Diagnoses: Principal Problem:   Severe major depression, single episode, without psychotic features (HCC) Active Problems:   Suicide attempt (HCC)   Current Medications:  Current Facility-Administered Medications  Medication Dose Route Frequency Provider Last Rate Last Admin   acetaminophen (TYLENOL) tablet 650 mg  650 mg Oral Q6H PRN Eligha Bridegroom, NP       albuterol (PROVENTIL) (2.5 MG/3ML) 0.083% nebulizer solution 2.5 mg  2.5 mg Nebulization Q6H PRN Eligha Bridegroom, NP       alum & mag hydroxide-simeth (MAALOX/MYLANTA) 200-200-20 MG/5ML suspension 30 mL  30 mL Oral Q4H PRN Eligha Bridegroom, NP       amLODipine (NORVASC) tablet 5 mg  5 mg Oral Daily Eligha Bridegroom, NP   5 mg at 05/23/23 0744   haloperidol (HALDOL) tablet 5 mg  5 mg Oral TID PRN Eligha Bridegroom, NP       And   diphenhydrAMINE (BENADRYL) capsule 50 mg  50 mg Oral TID PRN Eligha Bridegroom, NP       haloperidol lactate (HALDOL) injection 5 mg  5 mg Intramuscular TID PRN Eligha Bridegroom, NP       And   diphenhydrAMINE (BENADRYL) injection 50 mg  50 mg Intramuscular TID PRN Eligha Bridegroom, NP       And   LORazepam (ATIVAN) injection 2 mg  2 mg Intramuscular TID PRN Eligha Bridegroom, NP       haloperidol lactate (HALDOL) injection 10 mg  10 mg Intramuscular TID PRN Eligha Bridegroom, NP       And   diphenhydrAMINE (BENADRYL) injection 50 mg  50 mg Intramuscular TID PRN Eligha Bridegroom, NP       And   LORazepam (ATIVAN) injection 2 mg  2 mg Intramuscular TID PRN Eligha Bridegroom, NP       lisinopril (ZESTRIL) tablet 20 mg  20 mg Oral Daily Eligha Bridegroom, NP   20 mg at 05/23/23 0744   And   hydrochlorothiazide (HYDRODIURIL) tablet 12.5 mg  12.5 mg Oral Daily Eligha Bridegroom, NP    12.5 mg at 05/23/23 0744   hydrOXYzine (ATARAX) tablet 25 mg  25 mg Oral TID PRN Eligha Bridegroom, NP       magnesium hydroxide (MILK OF MAGNESIA) suspension 30 mL  30 mL Oral Daily PRN Eligha Bridegroom, NP       potassium chloride SA (KLOR-CON M) CR tablet 20 mEq  20 mEq Oral BID Eligha Bridegroom, NP   20 mEq at 05/23/23 0744   rosuvastatin (CRESTOR) tablet 20 mg  20 mg Oral Daily Eligha Bridegroom, NP   20 mg at 05/23/23 0744   traZODone (DESYREL) tablet 50 mg  50 mg Oral QHS PRN Eligha Bridegroom, NP       PTA Medications: Medications Prior to Admission  Medication Sig Dispense Refill Last Dose/Taking   albuterol (VENTOLIN HFA) 108 (90 Base) MCG/ACT inhaler Inhale 1-2 puffs into the lungs every 6 (six) hours as needed for wheezing or shortness of breath. 6.7 g 0    amLODipine (NORVASC) 5 MG tablet TAKE 1 TABLET(5 MG) BY MOUTH DAILY 90 tablet 0    blood glucose meter kit and supplies KIT Dispense based on patient and insurance preference. Use up to four times daily as directed. Please include lancets, test strips, control solution. 1 each 0    Dulaglutide (  TRULICITY) 3 MG/0.5ML SOAJ Inject 3 mg into the skin once a week. 6 mL 1    fexofenadine (ALLEGRA) 180 MG tablet Take 1 tablet (180 mg total) by mouth daily. 30 tablet 0    fluticasone (FLONASE) 50 MCG/ACT nasal spray Place 2 sprays into both nostrils daily. 16 g 6    guaiFENesin (MUCINEX) 600 MG 12 hr tablet Take 2 tablets (1,200 mg total) by mouth 2 (two) times daily as needed. (Patient not taking: Reported on 05/20/2023) 30 tablet 0    lisinopril-hydrochlorothiazide (ZESTORETIC) 20-12.5 MG tablet TAKE 1 TABLET BY MOUTH DAILY 90 tablet 0    meloxicam (MOBIC) 15 MG tablet Take 1 tablet (15 mg total) by mouth daily as needed for pain. (Patient not taking: Reported on 05/20/2023) 30 tablet 0    oxyCODONE (OXY IR/ROXICODONE) 5 MG immediate release tablet Take 1 tablet (5 mg total) by mouth every 6 (six) hours as needed for severe pain (pain score  7-10). (Patient not taking: Reported on 05/20/2023) 15 tablet 0    potassium chloride SA (KLOR-CON M) 20 MEQ tablet Take 1 tablet (20 mEq total) by mouth 2 (two) times daily. 15 tablet 0    rosuvastatin (CRESTOR) 20 MG tablet TAKE 1 TABLET(20 MG) BY MOUTH DAILY 90 tablet 0     Patient Stressors:    Patient Strengths:    Treatment Modalities: Medication Management, Group therapy, Case management,  1 to 1 session with clinician, Psychoeducation, Recreational therapy.   Physician Treatment Plan for Primary Diagnosis: Severe major depression, single episode, without psychotic features (HCC) Long Term Goal(s): Improvement in symptoms so as ready for discharge   Short Term Goals: Ability to identify triggers associated with substance abuse/mental health issues will improve  Medication Management: Evaluate patient's response, side effects, and tolerance of medication regimen.  Therapeutic Interventions: 1 to 1 sessions, Unit Group sessions and Medication administration.  Evaluation of Outcomes: Not Progressing  Physician Treatment Plan for Secondary Diagnosis: Principal Problem:   Severe major depression, single episode, without psychotic features (HCC) Active Problems:   Suicide attempt (HCC)  Long Term Goal(s): Improvement in symptoms so as ready for discharge   Short Term Goals: Ability to identify triggers associated with substance abuse/mental health issues will improve     Medication Management: Evaluate patient's response, side effects, and tolerance of medication regimen.  Therapeutic Interventions: 1 to 1 sessions, Unit Group sessions and Medication administration.  Evaluation of Outcomes: Not Progressing   RN Treatment Plan for Primary Diagnosis: Severe major depression, single episode, without psychotic features (HCC) Long Term Goal(s): Knowledge of disease and therapeutic regimen to maintain health will improve  Short Term Goals: Ability to remain free from injury will  improve, Ability to verbalize frustration and anger appropriately will improve, Ability to demonstrate self-control, Ability to participate in decision making will improve, Ability to verbalize feelings will improve, Ability to disclose and discuss suicidal ideas, Ability to identify and develop effective coping behaviors will improve, and Compliance with prescribed medications will improve  Medication Management: RN will administer medications as ordered by provider, will assess and evaluate patient's response and provide education to patient for prescribed medication. RN will report any adverse and/or side effects to prescribing provider.  Therapeutic Interventions: 1 on 1 counseling sessions, Psychoeducation, Medication administration, Evaluate responses to treatment, Monitor vital signs and CBGs as ordered, Perform/monitor CIWA, COWS, AIMS and Fall Risk screenings as ordered, Perform wound care treatments as ordered.  Evaluation of Outcomes: Not Progressing   LCSW Treatment Plan  for Primary Diagnosis: Severe major depression, single episode, without psychotic features (HCC) Long Term Goal(s): Safe transition to appropriate next level of care at discharge, Engage patient in therapeutic group addressing interpersonal concerns.  Short Term Goals: Engage patient in aftercare planning with referrals and resources, Increase social support, Increase ability to appropriately verbalize feelings, Increase emotional regulation, Facilitate acceptance of mental health diagnosis and concerns, Facilitate patient progression through stages of change regarding substance use diagnoses and concerns, Identify triggers associated with mental health/substance abuse issues, and Increase skills for wellness and recovery  Therapeutic Interventions: Assess for all discharge needs, 1 to 1 time with Social worker, Explore available resources and support systems, Assess for adequacy in community support network, Educate family  and significant other(s) on suicide prevention, Complete Psychosocial Assessment, Interpersonal group therapy.  Evaluation of Outcomes: Not Progressing   Progress in Treatment: Attending groups: Yes. Participating in groups: Yes. Taking medication as prescribed: Yes. Toleration medication: Yes. Family/Significant other contact made: No, will contact:  brother Joey Statin [phone] Patient understands diagnosis: Yes. Discussing patient identified problems/goals with staff: Yes. Medical problems stabilized or resolved: Yes. Denies suicidal/homicidal ideation: Yes. Issues/concerns per patient self-inventory: No.  New problem(s) identified: No, Describe:  none  New Short Term/Long Term Goal(s): medication stabilization, elimination of SI thoughts, development of comprehensive mental wellness plan.    Patient Goals:  "Discharge"  Discharge Plan or Barriers: Patient recently admitted. CSW will continue to follow and assess for appropriate referrals and possible discharge planning.    Reason for Continuation of Hospitalization: Depression Medication stabilization  Estimated Length of Stay: 5-7 days  Last 3 [place] Suicide Severity Risk Score: Flowsheet Row [facility] ([facility]20[facility]2023 in [facility] ED from [facility]/21/2023 in [facility] Department at [facility]  C-SSRS RISK CATEGORY No Risk No Risk No Risk       Last PHQ 2/9 Scores:    04/16/2018    3:58 PM 06/02/2015   11:47 AM  Depression screen PHQ 2/9  Decreased Interest 0 0  Down, Depressed, Hopeless 0 0  PHQ - 2 Score 0 0    Scribe for Treatment Team: Kathi Der, LCSWA 05/23/2023 1:20 PM

## 2023-05-23 NOTE — Group Note (Signed)
 Date:  05/23/2023 Time:  8:56 PM  Group Topic/Focus:  Narcotics Anonymous (NA) Meeting    Participation Level:  Minimal  Participation Quality:  Appropriate  Affect:  Appropriate  Cognitive:  Appropriate  Insight: Appropriate  Engagement in Group:  Limited  Modes of Intervention:  Socialization and Support  Additional Comments:  Patient attended NA Meeting but did not stay the entire time.   [name] [name] 05/23/2023, 8:56 PM

## 2023-05-23 NOTE — BHH Suicide Risk Assessment (Addendum)
 B[facility] INPATIENT:  Family/Significant Other Suicide Prevention Education  Suicide Prevention Education:  Education Completed; [name] [name] - brother [phone]), has been identified by the patient as the family member/significant other with whom the patient will be residing, and identified as the person(s) who will aid the patient in the event of a mental health crisis (suicidal ideations/suicide attempt).  With written consent from the patient, the family member/significant other has been provided the following suicide prevention education, prior to the and/or following the discharge of the patient.  Spoke with brother [name] [name] with Pt presents. He encouraged Pt to remain calm and to "finish out his seven days." He further encouraged [name] [name] to understand how he found himself in situation despite the misunderstanding by professionals. [name] [name] was receptive and stated that he understood. [name] further shared that he does feel Pt is suicidal and is looking forward to his brother's return to the community.   Again called [name] to speak with him privately. He did not have much additional information to provide. He confirmed that he received the concerning text messages as well as Pt's S/O [name] [name]. He stated that they later arrived to the hospital waiting room around 4AM and awaited an update on Pt. He was grateful to hear of Pt's safety. He further asked to see Pt and was told that Pt could not have visitors. [name] states that he began to wonder "what's going on" and verbalized some confusion about this transitioning to an inpatient [facility] stay. [name] states "he's not crazy" and "I don't think he was trying to kill himself" in regards to safety.   The suicide prevention education provided includes the following: Suicide risk factors Suicide prevention and interventions National Suicide Hotline telephone number [facility] assessment telephone number [facility] Emergency Assistance 911 [facility] and/or  Residential Mobile Crisis Unit telephone number  Request made of family/significant other to: Remove weapons (e.g., guns, rifles, knives), all items previously/currently identified as safety concern.   Remove drugs/medications (over-the-counter, prescriptions, illicit drugs), all items previously/currently identified as a safety concern.  The family member/significant other verbalizes understanding of the suicide prevention education information provided.  The family member/significant other agrees to remove the items of safety concern listed above.  [name] Oms [place], LCSW 05/23/2023, 7:27 PM

## 2023-05-23 NOTE — Progress Notes (Signed)
 [facility] MD Progress Note  05/23/2023 1:33 PM [name] [name]  MRN:  [id number] Subjective:   43 year old American male, divorced, part-time employed, cohabitates with his ex-girlfriend.  No formal history of any mental illness.  Patient presented involuntarily via EMS.  Patient was found unconscious in his car.  They have empty bottles of denied using, oxycodone and naproxen by his side.  They also found alcohol by his side.  He was reported to have sent a goodbye text message to family members. Patient was managed medically and referred to our services for stabilization.  Chart reviewed today.  Patient discussed at multidisciplinary team meeting.  Nursing staff reports that patient slept for 5.75 hours.  He is very focused on being discharged.  He requested for a second opinion yesterday as he feels like he does not need to be here.  No PRNs required so far.  Social worker plans to get collateral from his brother.  Seen today.  Patient remains very dismissive with respect to recent suicidal behavior.  States that he was drinking a bit and took more than required pills of muscle relaxants.  His explanation for other empty bottles that was found in the car was that he discarded them in the car when the medication finished.  PDMP review shows that his last prescription for oxycodone was 02/28/2023.  Patient is very irritated with the process as he feels like he needs to be out of here today.  He has limited understanding of the need to make sure that safety precautions are taken.  He is not interested in taking any psychotropic medication.  He tells [place] that he has his own therapist in the community.  He acknowledged sending a text messages to his brother and to his ex-girlfriend.  He however does not want [place] to retrieve the text message from his ex-girlfriend.  I explained my concerns about safety.  I explained that his IVC will be reviewed by a judge who will make the final determination.  Patient was  very impatient about the process and threatened legal actions.  I encouraged him to facilitate information that would help [place] understand his thought process around and during his overdose.   Principal Problem: Severe major depression, single episode, without psychotic features (HCC) Diagnosis: Principal Problem:   Severe major depression, single episode, without psychotic features (HCC) Active Problems:   Suicide attempt (HCC)  Total Time spent with patient: 20 minutes  Past Psychiatric History:  See H&P.  Past Medical History:  Past Medical History:  Diagnosis Date   Arthritis    Diabetes mellitus without complication (HCC)    Type 2   Hypercholesteremia    Hypertension    Pneumonia    Dec. 2023   Sleep apnea    Stroke (HCC) 2012   Bell's Palsy Deficit. Now resolved   TIA (transient ischemic attack) 2012    Past Surgical History:  Procedure Laterality Date   FOOT SURGERY Left 2004   KNEE SURGERY Left 2004   ORIF RADIUS & ULNA FRACTURES Left 2004   ORIF TIBIA & FIBULA FRACTURES Left 2004   ORTHOPEDIC SURGERY     PILONIDAL CYST EXCISION N/A 06/29/2022   Procedure: CYST EXCISION PILONIDAL EXTENSIVE;  Surgeon: Harriette Bouillon, MD;  Location: [facility] OR;  Service: General;  Laterality: N/A;   SHOULDER SURGERY     Family History:  Family History  Problem Relation Age of Onset   Diabetes Mother    Healthy Father    Family Psychiatric  History:  See H&P. Social History:  Social History   Substance and Sexual Activity  Alcohol Use Yes   Comment: occasionally     Social History   Substance and Sexual Activity  Drug Use No   Comment: UDS positive histocially    Social History   Socioeconomic History   Marital status: Significant Other    Spouse name: Not on file   Number of children: Not on file   Years of education: Not on file   Highest education level: Not on file  Occupational History   Not on file  Tobacco Use   Smoking status: Some Days    Types: Cigars    Smokeless tobacco: Never  Vaping Use   Vaping status: Never Used  Substance and Sexual Activity   Alcohol use: Yes    Comment: occasionally   Drug use: No    Comment: UDS positive histocially   Sexual activity: Yes    Birth control/protection: None  Other Topics Concern   Not on file  Social History Narrative   Not on file   Social Drivers of Health   Financial Resource Strain: Not on file  Food Insecurity: No Food Insecurity (05/21/2023)   Hunger Vital Sign    Worried About Running Out of Food in the Last Year: Never true    Ran Out of Food in the Last Year: Never true  Transportation Needs: No Transportation Needs (05/21/2023)   PRAPARE - Administrator, Civil Service (Medical): No    Lack of Transportation (Non-Medical): No  Physical Activity: Not on file  Stress: Not on file  Social Connections: Not on file   Additional Social History:   Current Medications: Current Facility-Administered Medications  Medication Dose Route Frequency Provider Last Rate Last Admin   acetaminophen (TYLENOL) tablet 650 mg  650 mg Oral Q6H PRN Eligha Bridegroom, NP       albuterol (PROVENTIL) (2.5 MG/3ML) 0.083% nebulizer solution 2.5 mg  2.5 mg Nebulization Q6H PRN Eligha Bridegroom, NP       alum & mag hydroxide-simeth (MAALOX/MYLANTA) 200-200-20 MG/5ML suspension 30 mL  30 mL Oral Q4H PRN Eligha Bridegroom, NP       amLODipine (NORVASC) tablet 5 mg  5 mg Oral Daily Eligha Bridegroom, NP   5 mg at 05/23/23 0744   haloperidol (HALDOL) tablet 5 mg  5 mg Oral TID PRN Eligha Bridegroom, NP       And   diphenhydrAMINE (BENADRYL) capsule 50 mg  50 mg Oral TID PRN Eligha Bridegroom, NP       haloperidol lactate (HALDOL) injection 5 mg  5 mg Intramuscular TID PRN Eligha Bridegroom, NP       And   diphenhydrAMINE (BENADRYL) injection 50 mg  50 mg Intramuscular TID PRN Eligha Bridegroom, NP       And   LORazepam (ATIVAN) injection 2 mg  2 mg Intramuscular TID PRN Eligha Bridegroom, NP        haloperidol lactate (HALDOL) injection 10 mg  10 mg Intramuscular TID PRN Eligha Bridegroom, NP       And   diphenhydrAMINE (BENADRYL) injection 50 mg  50 mg Intramuscular TID PRN Eligha Bridegroom, NP       And   LORazepam (ATIVAN) injection 2 mg  2 mg Intramuscular TID PRN Eligha Bridegroom, NP       lisinopril (ZESTRIL) tablet 20 mg  20 mg Oral Daily Eligha Bridegroom, NP   20 mg at 05/23/23 0744   And  hydrochlorothiazide (HYDRODIURIL) tablet 12.5 mg  12.5 mg Oral Daily Eligha Bridegroom, NP   12.5 mg at 05/23/23 0744   hydrOXYzine (ATARAX) tablet 25 mg  25 mg Oral TID PRN Eligha Bridegroom, NP       magnesium hydroxide (MILK OF MAGNESIA) suspension 30 mL  30 mL Oral Daily PRN Eligha Bridegroom, NP       potassium chloride SA (KLOR-CON M) CR tablet 20 mEq  20 mEq Oral BID Eligha Bridegroom, NP   20 mEq at 05/23/23 0744   rosuvastatin (CRESTOR) tablet 20 mg  20 mg Oral Daily Eligha Bridegroom, NP   20 mg at 05/23/23 0744   traZODone (DESYREL) tablet 50 mg  50 mg Oral QHS PRN Eligha Bridegroom, NP        Lab Results: No results found for this or any previous visit (from the past 48 hours).  Blood Alcohol level:  Lab Results  Component Value Date   ETH 15 (H) 05/20/2023   ETH <5 08/22/2016    Metabolic Disorder Labs: Lab Results  Component Value Date   HGBA1C 6.8 (H) 02/15/2023   MPG 134 (H) 07/20/2010   No results found for: "PROLACTIN" Lab Results  Component Value Date   CHOL 121 05/17/2022   TRIG 147.0 05/17/2022   HDL 32.20 (L) 05/17/2022   CHOLHDL 4 05/17/2022   VLDL 29.4 05/17/2022   LDLCALC 60 05/17/2022   LDLCALC 88 02/15/2022    Physical Findings: AIMS:  , ,  ,  ,    CIWA:    COWS:     Musculoskeletal: Strength & Muscle Tone: within normal limits Gait & Station: normal Patient leans: N/A  Psychiatric Specialty Exam:  Presentation  General Appearance:  Obese, not in any distress, guarded about some information, moderate rapport.   Eye Contact: Good eye  contact.   Speech: Spontaneous.  Normal rate, tone and volume.   Mood and Affect  Mood: Irritable.   Affect: Restricted mood congruent.     Thought Process  Thought Processes: Normal speed of thought.  Linear and goal directed.     Descriptions of Associations: Intact.   Orientation: Fully oriented to self place and person.   Thought Content: Minimizes recent suicidal attempt.  Dismissive of any lingering suicidal thoughts.  No homicidal thoughts.  No thoughts of violence.  No delusional preoccupation.  Focused on going for job interview.   Hallucinations: No hallucination in any modality.     Sensorium  Memory: Good.   Judgment: Fair.   Insight: Limited as patient is dismissive of any mental illness.  He does not want to take medication.   Executive Functions  Concentration: Good.   Attention Span: Good.   Recall: Good.   Fund of Knowledge: Fair.   Language: Good.   Psychomotor Activity  Normal psychomotor activity.   Physical Exam: Physical Exam ROS Blood pressure (!) 164/96, pulse 71, temperature 98 F (36.7 C), temperature source Oral, resp. rate 16, height 6\' 4"  (1.93 m), weight (!) 165.6 kg, SpO2 100%. Body mass index is 44.43 kg/m.   Treatment Plan Summary: Patient remains guarded about certain information relating to his overdose.  We do not have any collateral at this point.  We have requested a printout of text messages sent by patient so as to aid in our assessment.  There is no indication for forced medication.  We will continue to encourage trial of an SSRI.  We will continue to monitor him and evaluate him further.  1.  Continue to monitor mood behavior and interaction with others. 2.  Continue to encourage unit groups and therapeutic activities. 3.  Continue home medical medications at current doses. 4.  Social worker will obtain collateral from family.  This will include printout of his final text messages. 5.  We will  continue IVC process.  Very likely final decision will be made at his hearing.  Georgiann Cocker, MD 05/23/2023, 1:33 PM

## 2023-05-24 DIAGNOSIS — F322 Major depressive disorder, single episode, severe without psychotic features: Secondary | ICD-10-CM

## 2023-05-24 MED ORDER — LISINOPRIL 20 MG PO TABS
20.0000 mg | ORAL_TABLET | Freq: Every day | ORAL | 0 refills | Status: DC
Start: 2023-05-25 — End: 2023-06-04

## 2023-05-24 MED ORDER — HYDROCHLOROTHIAZIDE 12.5 MG PO TABS: 12.5000 mg | ORAL_TABLET | Freq: Every day | ORAL | 0 refills | Status: DC

## 2023-05-24 NOTE — Progress Notes (Signed)
  [facility] Adult Case Management Discharge Plan :  Will you be returning to the same living situation after discharge:  Yes,  home w / self At discharge, do you have transportation home?: Yes,  safe transport arranged to [place] to retrieve his vehicle Do you have the ability to pay for your medications: Yes,  reports ability to self-pay  Release of information consent forms completed and in the chart;  Patient's signature needed at discharge.  Patient to Follow up at:  Follow-up Information     [facility]. Go on 06/05/2023.   Specialty: Behavioral Health Why: Please go to this provider on 06/05/23 at 7:00 am for an assessment, to obtain medication management services.  You may also go on Monday through Friday, arrive by 7:00 am. Contact information: [address] [place]  [address] [phone]        [facility]. Schedule an appointment as soon as possible for a visit.   Why: Javoni MUST CALL DIRECTLY TO SCHEDULE HIS OWN APPOINTMENT Contact information: [address] [place], [place] [address] [phone]                 Next level of care provider has access to [facility] Link:no  Safety Planning and Suicide Prevention discussed: Yes,  completed w/ pt     Has patient been referred to the Quitline?: Patient does not use tobacco/nicotine products  Patient has been referred for addiction treatment: No known substance use disorder.  Tanith Dagostino N Zakya Halabi, LCSW 05/24/2023, 2:56 PM

## 2023-05-24 NOTE — Progress Notes (Signed)
 Patient discharged to home via personal vehicle. Pt transported to his vehicle by safe transport. Discharge instructions, all required discharge documents and information about follow-up appointment given to pt with verbalization of understanding. All personal belongings returned to pt at time of discharge. Pt escorted to lobby by RN at 1545.  05/24/23 0920  Psych Admission Type (Psych Patients Only)  Admission Status Involuntary  Psychosocial Assessment  Patient Complaints None  Eye Contact Fair  Facial Expression Flat  Affect Preoccupied  Speech Logical/coherent  Interaction Assertive  Motor Activity Other (Comment) (WNL)  Appearance/Hygiene Unremarkable  Behavior Characteristics Cooperative;Appropriate to situation  Mood Preoccupied;Irritable  Thought Process  Coherency WDL  Content Blaming others  Delusions None reported or observed  Perception WDL  Hallucination None reported or observed  Judgment Limited  Confusion None  Danger to Self  Current suicidal ideation? Denies  Agreement Not to Harm Self Yes  Description of Agreement Verbal  Danger to Others  Danger to Others None reported or observed

## 2023-05-24 NOTE — Discharge Summary (Signed)
 Physician Discharge Summary Note  Patient:  [name] [name] is an 43 y.o., male MRN:  [id number] DOB:  [birth year] Patient phone:  [phone] (home)  Patient address:   [address] [place] [facility] [address],  Total Time spent with patient: 45 minutes  Date of Admission:  05/21/2023 Date of Discharge: 05/24/2023  Reason for Admission:   43 year old American male, divorced, part-time employed, cohabitates with his ex-girlfriend.  No formal history of any mental illness.  Patient presented involuntarily via EMS.  Patient was found unconscious in his car.  They have empty bottles of denied using, oxycodone and naproxen by his side.  They also found alcohol by his side.  He was reported to have sent a goodbye text message to family members. Patient was managed medically and referred to our services for stabilization.   Chart reviewed today.  Patient discussed at multidisciplinary team meeting.   Nursing staff reports that patient has not had any challenging behavior on the unit.  He has been taking his medical medications.   I met with the patient today.  He minimizes recent overdose stating that he was slightly drunk.  Repeatedly dismissed any intent to end his life and simply down the text message that he sent to his brother.  States that when he said he did not want to be "here anymore", he meant the apartment that he is sharing with his ex-girlfriend.   Patient states that he had been in a relationship with ex-girlfriend for about 6 years.  States that in November 2024, he found out that she was having an affair.  States that he decided to end the relationship himself.  They are both on the lease on the lease is not due until November 2025.  Patient reports attachments to ex-girlfriend's 6 year old child.  Dismissed the idea of killing himself because of that failed relationship.  Patient also states that he used to work in a [employer] account executive.  States that he was laid off in February.   He has been doing temporary jobs.  Patient states that he has a new position which he found out about on Monday.  States that interview is coming up soon and he wants to be out of the hospital to have this interview.   Patient dismissed feeling depressed following his break-up.  He tells me that he has three grown children who are doing well and two grandchildren.  States he had no intention of hurting them emotionally.  Patient states that he does not drink typically on a regular basis.  He dismissed use of any psychoactive substance.   Patient does not feel like he needs to be here.  Asked me repeatedly what he had to do so as to be discharged today.  Importance of maintaining his safety and safety of others restated to him.  He has agreed to collateral from his brother whom he sent the text message to.  Brother's name is Francis Dowse, phone #[phone].   No evidence of pervasive depression.  No evidence of overwhelming anxiety.  No evidence of mania.  No evidence of psychosis.  No evidence of PTSD.  No evidence of OCD.   Review of symptoms essentially as above.    Principal Problem: Severe major depression, single episode, without psychotic features [facility]) Discharge Diagnoses: Principal Problem:   Severe major depression, single episode, without psychotic features (HCC) Active Problems:   Suicide attempt [facility])   Past Psychiatric History:  No past history of mental illness.  This is patient's first  inpatient hospitalization. Patient is nave to psychotropic medication. No past history of self-injurious behavior.  No past suicidal attempts prior to this overdose.  No past history of violent behavior.  No past history of mania.  No past history of psychosis. Patient minimized his alcohol use.  He dismissed use of any other psychoactive substance.  No past rehab treatment. No past history of neuromodulation. Patient is not in therapy.   Past Medical History:   Past Surgical History:  Procedure  Laterality Date   FOOT SURGERY Left 2004   KNEE SURGERY Left 2004   ORIF RADIUS & ULNA FRACTURES Left 2004   ORIF TIBIA & FIBULA FRACTURES Left 2004   ORTHOPEDIC SURGERY     PILONIDAL CYST EXCISION N/A 06/29/2022   Procedure: CYST EXCISION PILONIDAL EXTENSIVE;  Surgeon: Harriette Bouillon, MD;  Location: [facility] OR;  Service: General;  Laterality: N/A;   SHOULDER SURGERY     Family History:  Family History  Problem Relation Age of Onset   Diabetes Mother    Healthy Father    Family Psychiatric  History:  None. Social History:  Social History   Substance and Sexual Activity  Alcohol Use Yes   Comment: occasionally     Social History   Substance and Sexual Activity  Drug Use No   Comment: UDS positive histocially    Social History   Socioeconomic History   Marital status: Significant Other    Spouse name: Not on file   Number of children: Not on file   Years of education: Not on file   Highest education level: Not on file  Occupational History   Not on file  Tobacco Use   Smoking status: Some Days    Types: Cigars   Smokeless tobacco: Never  Vaping Use   Vaping status: Never Used  Substance and Sexual Activity   Alcohol use: Yes    Comment: occasionally   Drug use: No    Comment: UDS positive histocially   Sexual activity: Yes    Birth control/protection: None  Other Topics Concern   Not on file  Social History Narrative   Not on file   Social Drivers of Health   Financial Resource Strain: Not on file  Food Insecurity: No Food Insecurity (05/21/2023)   Hunger Vital Sign    Worried About Running Out of Food in the Last Year: Never true    Ran Out of Food in the Last Year: Never true  Transportation Needs: No Transportation Needs (05/21/2023)   PRAPARE - Administrator, Civil Service (Medical): No    Lack of Transportation (Non-Medical): No  Physical Activity: Not on file  Stress: Not on file  Social Connections: Not on file    Hospital Course:    Patient was admitted on suicide precautions.  He was offered an SSRI but refused to take any psychotropic medication.  He was very adamant that he did not try to kill himself.  He repeatedly explained that his text message was taken out of context.  Stated that what he meant by "checking out" was checking out of the relationship and not checking out of the world.  Patient did not exhibit any manic features.  He did not exhibit any psychotic features.  He did not exhibit pervasive features of depression.  He was anxious to keep get discharged sooner rather than later.  He slept well, he ate well and maintaining appropriate care.  He did not require any emergency measures during his  hospital stay.  Was discussed at team today.  Text messages between the patient and brother was printed out, will have a copy in his paper chart.  Review of the text message is in keeping with patient's account of what happened.  Social worker have spoken to patient's family and there is no concerns about him harming himself.  At interview with patient today, he tells me that he has no desire to kill himself over the relationship that he ended himself in November.  States that he is trying to figure out the way for both of them not to live under the same roof.  He is not endorsing any rageful thoughts towards himself.  He is not endorsing any violent thoughts towards others or anyone else.  Patient is future oriented as he wants to take care of his bills and seek out new employment.  Patient and team agrees that he is at his baseline.  Team agrees with discharge today.   Physical Findings: AIMS:  , ,  ,  ,    CIWA:    COWS:     Musculoskeletal: Strength & Muscle Tone: within normal limits Gait & Station: normal Patient leans: N/A   Psychiatric Specialty Exam:  Presentation  General Appearance:  Casually dressed, overweight, not in any distress, appropriate behavior, engaged politely.  No EPS.  Eye  Contact: Good.  Speech: Spontaneous.  Normal rate, tone and volume.  Normal prosody of speech.  Mood and Affect  Mood: Euthymic.  Affect: Full range and appropriate.  Thought Process  Thought Processes: Linear and goal directed.  Descriptions of Associations:Intact  Orientation:Full (Time, Place and Person)  Thought Content: No current suicidal thoughts.  No homicidal thoughts.  No thoughts of violence.  No negative ruminative flooding.  No guilty ruminations.  No delusional theme.  No obsessions.  Hallucinations: No hallucination in any modality.  Sensorium  Memory: Good.  Judgment: Good.  Insight: Good  Executive Functions  Concentration: Good.  Attention Span: Good.  Recall: Good.  Fund of Knowledge: Good.  Language: Good   Psychomotor Activity  Normal psychomotor activity    Physical Exam: Physical Exam ROS Blood pressure (!) 175/95, pulse 69, temperature 98 F (36.7 C), temperature source Oral, resp. rate 18, height 6\' 4"  (1.93 m), weight (!) 165.6 kg, SpO2 99%. Body mass index is 44.43 kg/m.   Social History   Tobacco Use  Smoking Status Some Days   Types: Cigars  Smokeless Tobacco Never   Tobacco Cessation:  N/A, patient does not currently use tobacco products   Blood Alcohol level:  Lab Results  Component Value Date   ETH 15 (H) 05/20/2023   ETH <5 08/22/2016    Metabolic Disorder Labs:  Lab Results  Component Value Date   HGBA1C 6.8 (H) 02/15/2023   MPG 134 (H) 07/20/2010   No results found for: "PROLACTIN" Lab Results  Component Value Date   CHOL 121 05/17/2022   TRIG 147.0 05/17/2022   HDL 32.20 (L) 05/17/2022   CHOLHDL 4 05/17/2022   VLDL 29.4 05/17/2022   LDLCALC 60 05/17/2022   LDLCALC 88 02/15/2022    See Psychiatric Specialty Exam and Suicide Risk Assessment completed by Attending Physician prior to discharge.  Discharge destination:  Home  Is patient on multiple antipsychotic therapies at  discharge:  No   Has Patient had three or more failed trials of antipsychotic monotherapy by history:  No  Recommended Plan for Multiple Antipsychotic Therapies: NA  Discharge Instructions  Diet - low sodium heart healthy   Complete by: As directed    Increase activity slowly   Complete by: As directed       Allergies as of 05/24/2023       Reactions   Tea Anaphylaxis   Pork-derived Products    Patient said he does not eat pork        Medication List     STOP taking these medications    blood glucose meter kit and supplies Kit   fexofenadine 180 MG tablet Commonly known as: ALLEGRA   fluticasone 50 MCG/ACT nasal spray Commonly known as: FLONASE   guaiFENesin 600 MG 12 hr tablet Commonly known as: Mucinex   lisinopril-hydrochlorothiazide 20-12.5 MG tablet Commonly known as: ZESTORETIC   meloxicam 15 MG tablet Commonly known as: MOBIC   oxyCODONE 5 MG immediate release tablet Commonly known as: Oxy IR/ROXICODONE   potassium chloride SA 20 MEQ tablet Commonly known as: KLOR-CON M   Trulicity 3 MG/0.5ML Soaj Generic drug: Dulaglutide       TAKE these medications      Indication  albuterol 108 (90 Base) MCG/ACT inhaler Commonly known as: VENTOLIN HFA Inhale 1-2 puffs into the lungs every 6 (six) hours as needed for wheezing or shortness of breath.  Indication: Chronic Obstructive Lung Disease   amLODipine 5 MG tablet Commonly known as: NORVASC TAKE 1 TABLET(5 MG) BY MOUTH DAILY  Indication: High Blood Pressure   hydrochlorothiazide 12.5 MG tablet Commonly known as: HYDRODIURIL Take 1 tablet (12.5 mg total) by mouth daily. Start taking on: May 25, 2023  Indication: High Blood Pressure   lisinopril 20 MG tablet Commonly known as: ZESTRIL Take 1 tablet (20 mg total) by mouth daily. Start taking on: May 25, 2023  Indication: High Blood Pressure   rosuvastatin 20 MG tablet Commonly known as: CRESTOR TAKE 1 TABLET(20 MG) BY MOUTH  DAILY  Indication: High Amount of Fats in the Blood        Follow-up Information     [facility]. Go on 06/05/2023.   Specialty: Behavioral Health Why: Please go to this provider on 06/05/23 at 7:00 am for an assessment, to obtain medication management services.  You may also go on Monday through Friday, arrive by 7:00 am. Contact information: [address] [place] [address] [phone]        [facility]. Schedule an appointment as soon as possible for a visit.   Contact information: [address] [place] Skeeters [place], [place] [address] [phone]         [place]. Go to.   Specialty: Professional Counselor Why: You may also go to this provider for therapy services Monday through Friday, from 9 am to 1 pm for an initial assessment. Contact information: [address] [place] [address] [phone]                 Follow-up recommendations:    Patient will follow up as recommended.  No restrictions with respect to diet or activity.  Signed: Georgiann Cocker, MD 05/24/2023, 1:59 PM

## 2023-05-24 NOTE — Progress Notes (Signed)
   05/24/23 0340  Psych Admission Type (Psych Patients Only)  Admission Status Involuntary  Psychosocial Assessment  Patient Complaints None  Eye Contact Fair  Facial Expression Flat  Affect Preoccupied  Speech Logical/coherent  Interaction Assertive  Motor Activity Other (Comment) (WDL)  Appearance/Hygiene Unremarkable  Behavior Characteristics Cooperative;Appropriate to situation  Mood Euthymic  Thought Process  Coherency WDL  Content Blaming others  Delusions None reported or observed  Perception WDL  Hallucination None reported or observed  Judgment Limited  Confusion None  Danger to Self  Current suicidal ideation? Denies  Agreement Not to Harm Self Yes  Description of Agreement verbal  Danger to Others  Danger to Others None reported or observed

## 2023-05-24 NOTE — Group Note (Signed)
 LCSW Group Therapy Note   Group Date: 05/24/2023 Start Time: 1100 End Time: 1200   Participation:  patient was present.  He listened and was respectful but didn't participate in the discussion.  Type of Therapy:  Group Therapy  Title:  Healing Hearts:  A Safe Space for Grief  Objective: Healing He[facility]:  A Safe Space for Grief aims to provide a compassionate environment for participants to process grief, explore its stages, and create personal rituals to honor loved ones.  Goals: Foster a safe, non-judgmental space for sharing grief experiences. Educate on the stages of grief, emphasizing that healing is unique to each individual. Introduce rituals as a meaningful way to cope and honor lost loved ones.  Summary: In Healing He[facility], participants explored their unique grief journeys, learning that grief is non-linear and personal. We discussed the 5 stages (denial, anger, bargaining, depression, and acceptance), and how rituals--such as lighting candles or memory walks--can offer comfort. Emphasis was placed on self-care, emotional expression, and gratitude for loved ones. Healing was framed as a process without a set timeline, with a focus on individualized paths to honoring loss.  Therapeutic Modalities: Grief Counseling: Providing emotional support through active listening and validation of feelings. Elements of DBT:  Mindfulness Practices: Encouraging present-moment awareness to reduce emotional distress. Cognitive Behavioral Therapy (CBT): Challenging negative thought patterns associated with grief.   Alla Feeling, LCSWA 05/24/2023  2:44 PM

## 2023-05-24 NOTE — BHH Suicide Risk Assessment (Signed)
 [facility] Discharge Suicide Risk Assessment   Principal Problem: Severe major depression, single episode, without psychotic features (HCC) Discharge Diagnoses: Principal Problem:   Severe major depression, single episode, without psychotic features (HCC) Active Problems:   Suicide attempt (HCC)   Total Time spent with patient: 30 minutes  Musculoskeletal: Strength & Muscle Tone: within normal limits Gait & Station: normal Patient leans: N/A  Psychiatric Specialty Exam  Presentation  General Appearance: No data recorded Eye Contact:No data recorded Speech:No data recorded Speech Volume:No data recorded Handedness:No data recorded  Mood and Affect  Mood:No data recorded Duration of Depression Symptoms: No data recorded Affect:No data recorded  Thought Process  Thought Processes:No data recorded Descriptions of Associations:No data recorded Orientation:No data recorded Thought Content:No data recorded History of Schizophrenia/Schizoaffective disorder:No data recorded Duration of Psychotic Symptoms:No data recorded Hallucinations:No data recorded Ideas of Reference:No data recorded Suicidal Thoughts:No data recorded Homicidal Thoughts:No data recorded  Sensorium  Memory:No data recorded Judgment:No data recorded Insight:No data recorded  Executive Functions  Concentration:No data recorded Attention Span:No data recorded Recall:No data recorded Fund of Knowledge:No data recorded Language:No data recorded  Psychomotor Activity  Psychomotor Activity:No data recorded  Assets  Assets:No data recorded  Sleep  Sleep:No data recorded  Physical Exam: Physical Exam ROS Blood pressure (!) 175/95, pulse 69, temperature 98 F (36.7 C), temperature source Oral, resp. rate 18, height 6\' 4"  (1.93 m), weight (!) 165.6 kg, SpO2 99%. Body mass index is 44.43 kg/m.  Mental Status Per Nursing Assessment::   On Admission:  Suicidal ideation indicated by others  Demographic  Factors:  Male and Divorced or widowed  Loss Factors: Decrease in vocational status  Historical Factors: Impulsivity  Risk Reduction Factors:   Responsible for children under 53 years of age, Sense of responsibility to family, Positive social support, Positive therapeutic relationship, and Positive coping skills or problem solving skills  Continued Clinical Symptoms:  No evidence of depression.  No evidence of mania.  No evidence of psychosis.  Cognitive Features That Contribute To Risk:  None    Suicide Risk:  Minimal: No identifiable suicidal ideation.  Patients presenting with no risk factors but with morbid ruminations; may be classified as minimal risk based on the severity of the depressive symptoms   Follow-up Information     [facility]. Go on 06/05/2023.   Specialty: Behavioral Health Why: Please go to this provider on 06/05/23 at 7:00 am for an assessment, to obtain medication management services.  You may also go on Monday through Friday, arrive by 7:00 am. Contact information: [address] [place] [address] [phone]        [facility]. Schedule an appointment as soon as possible for a visit.   Contact information: [address] [place]ervyn Skeeters [place], [place] [address] [phone]         [place]. Go to.   Specialty: Professional Counselor Why: You may also go to this provider for therapy services Monday through Friday, from 9 am to 1 pm for an initial assessment. Contact information: [address] [place] [address] [phone]                 Plan Of Care/Follow-up recommendations:  Patient will follow up as recommended.  Georgiann Cocker, MD 05/24/2023, 2:13 PM

## 2023-05-24 NOTE — Plan of Care (Signed)
 ?  Problem: Activity: ?Goal: Interest or engagement in activities will improve ?Outcome: Progressing ?Goal: Sleeping patterns will improve ?Outcome: Progressing ?  ?Problem: Coping: ?Goal: Ability to verbalize frustrations and anger appropriately will improve ?Outcome: Progressing ?Goal: Ability to demonstrate self-control will improve ?Outcome: Progressing ?  ?Problem: Safety: ?Goal: Periods of time without injury will increase ?Outcome: Progressing ?  ?

## 2023-06-01 ENCOUNTER — Other Ambulatory Visit: Payer: Self-pay | Admitting: Family Medicine

## 2023-06-01 DIAGNOSIS — I10 Essential (primary) hypertension: Secondary | ICD-10-CM

## 2023-06-04 NOTE — Telephone Encounter (Signed)
 Looks like these were prescribed separated recently after hospital stay. Okay to approve?

## 2023-06-29 ENCOUNTER — Other Ambulatory Visit: Payer: Self-pay | Admitting: Family Medicine

## 2023-06-29 ENCOUNTER — Telehealth: Payer: Self-pay

## 2023-06-29 DIAGNOSIS — I10 Essential (primary) hypertension: Secondary | ICD-10-CM

## 2023-06-29 DIAGNOSIS — E1165 Type 2 diabetes mellitus with hyperglycemia: Secondary | ICD-10-CM

## 2023-06-29 NOTE — Telephone Encounter (Signed)
 Copied from CRM [id number]. Topic: Referral - Prior Authorization Question >> Jun 29, 2023 12:26 PM [name] [name] wrote: Reason for CRM: Pt called to advise pcp need to do verification for trulicity .

## 2023-06-29 NOTE — Telephone Encounter (Signed)
 Looks like he was told to stop Trulicity  at his last discharge appt. Please advise.

## 2023-06-29 NOTE — Telephone Encounter (Signed)
 Receipt confirmation from the pharmacy noted.

## 2023-06-29 NOTE — Telephone Encounter (Signed)
 Copied from CRM [id number]. Topic: Clinical - Medication Refill >> Jun 29, 2023 12:12 PM Laquanda P wrote: Medication: lisinopril -hydrochlorothiazide  (ZESTORETIC ) 20-12.5 MG tablet  Has the patient contacted their pharmacy? Yes (Agent: If no, request that the patient contact the pharmacy for the refill. If patient does not wish to contact the pharmacy document the reason why and proceed with request.) (Agent: If yes, when and what did the pharmacy advise?)  This is the patient's preferred pharmacy:  [id number] [place], [place] - [address] AT [facility] OF [facility] & [address] [place] [address] Phone: [phone] Fax: [phone]  Is this the correct pharmacy for this prescription? Yes If no, delete pharmacy and type the correct one.   Has the prescription been filled recently? No  Is the patient out of the medication? Yes  Has the patient been seen for an appointment in the last year OR does the patient have an upcoming appointment? Yes  Can we respond through MyChart? Yes  Agent: Please be advised that Rx refills may take up to 3 business days. We ask that you follow-up with your pharmacy.

## 2023-07-02 ENCOUNTER — Other Ambulatory Visit: Payer: Self-pay | Admitting: Family Medicine

## 2023-07-02 ENCOUNTER — Other Ambulatory Visit (HOSPITAL_COMMUNITY): Payer: Self-pay

## 2023-07-02 DIAGNOSIS — E1165 Type 2 diabetes mellitus with hyperglycemia: Secondary | ICD-10-CM

## 2023-07-02 DIAGNOSIS — I10 Essential (primary) hypertension: Secondary | ICD-10-CM

## 2023-07-02 MED ORDER — AMLODIPINE BESYLATE 5 MG PO TABS: 5.0000 mg | ORAL_TABLET | Freq: Every day | ORAL | 0 refills | Status: DC

## 2023-07-02 MED ORDER — TRULICITY 3 MG/0.5ML ~~LOC~~ SOAJ: 3.0000 mg | SUBCUTANEOUS | 0 refills | Status: DC

## 2023-07-02 MED ORDER — ROSUVASTATIN CALCIUM 20 MG PO TABS: 20.0000 mg | ORAL_TABLET | Freq: Every day | ORAL | 0 refills | Status: DC

## 2023-07-02 MED ORDER — LISINOPRIL-HYDROCHLOROTHIAZIDE 20-12.5 MG PO TABS: 1.0000 | ORAL_TABLET | Freq: Every day | ORAL | 0 refills | Status: DC

## 2023-07-02 NOTE — Telephone Encounter (Signed)
 Appt scheduled. Patient needed refills until appt. Sent.

## 2023-07-02 NOTE — Addendum Note (Signed)
 Addended by: Tymon Nemetz L on: 07/02/2023 09:56 AM   Modules accepted: Orders

## 2023-07-11 ENCOUNTER — Inpatient Hospital Stay: Admitting: Family Medicine

## 2023-07-11 DIAGNOSIS — I10 Essential (primary) hypertension: Secondary | ICD-10-CM

## 2023-07-11 DIAGNOSIS — E1165 Type 2 diabetes mellitus with hyperglycemia: Secondary | ICD-10-CM

## 2023-08-13 ENCOUNTER — Other Ambulatory Visit: Payer: Self-pay | Admitting: Family Medicine

## 2023-08-13 DIAGNOSIS — E1165 Type 2 diabetes mellitus with hyperglycemia: Secondary | ICD-10-CM

## 2023-08-14 ENCOUNTER — Other Ambulatory Visit: Payer: Self-pay | Admitting: Family Medicine

## 2023-08-14 ENCOUNTER — Telehealth: Payer: Self-pay | Admitting: Neurology

## 2023-08-14 DIAGNOSIS — E1165 Type 2 diabetes mellitus with hyperglycemia: Secondary | ICD-10-CM

## 2023-08-14 NOTE — Telephone Encounter (Signed)
 We sent a month to get to his last appt and then he no showed. Please advise on refills prior to scheduling?

## 2023-08-14 NOTE — Telephone Encounter (Signed)
 Copied from CRM [id number]. Topic: Clinical - Medication Question >> Aug 14, 2023  1:41 PM Mesmerise C wrote: Reason for CRM: Patient requesting a refill of his Rosuvastatin  and Trucility until he can make an appointment it keeps being denied and took his last Rosuvastatin  pill today patient is working 2 jobs and is hard to schedule an appointment right now please call patient if he needs to schedule an appointment first before refill [phone]

## 2023-08-15 ENCOUNTER — Other Ambulatory Visit: Payer: Self-pay | Admitting: Family Medicine

## 2023-08-15 DIAGNOSIS — E1165 Type 2 diabetes mellitus with hyperglycemia: Secondary | ICD-10-CM

## 2023-08-15 MED ORDER — ROSUVASTATIN CALCIUM 20 MG PO TABS: 20.0000 mg | ORAL_TABLET | Freq: Every day | ORAL | 0 refills | Status: DC

## 2023-08-15 MED ORDER — TRULICITY 3 MG/0.5ML ~~LOC~~ SOAJ
3.0000 mg | SUBCUTANEOUS | 0 refills | Status: DC
Start: 2023-08-15 — End: 2023-09-28

## 2023-08-15 NOTE — Telephone Encounter (Signed)
 I really don't want him to go without these meds, but he definitely needs to be seen soon. Go ahead with 1 more month and tell him we have to do a follow-up because we need to recheck labs - if absolutely necessary, we could probably do virtual if he can come in separately for labs (CBC CMP A1c lipids TSH)

## 2023-08-15 NOTE — Telephone Encounter (Signed)
 Mychart message sent to patient.

## 2023-09-13 ENCOUNTER — Telehealth: Payer: Self-pay | Admitting: Neurology

## 2023-09-13 ENCOUNTER — Other Ambulatory Visit (HOSPITAL_COMMUNITY): Payer: Self-pay

## 2023-09-13 ENCOUNTER — Telehealth: Payer: Self-pay

## 2023-09-13 NOTE — Telephone Encounter (Unsigned)
 Copied from CRM [id number]. Topic: Clinical - Medication Refill >> Sep 13, 2023  9:16 AM Jasmin G wrote: Medication:  rosuvastatin  (CRESTOR ) 20 MG tablet Dulaglutide  (TRULICITY ) 3 MG/0.5ML SOAJ  Has the patient contacted their pharmacy? Yes (Agent: If no, request that the patient contact the pharmacy for the refill. If patient does not wish to contact the pharmacy document the reason why and proceed with request.) (Agent: If yes, when and what did the pharmacy advise?)  This is the patient's preferred pharmacy:  [id number] [place], [place] - [address] AT [facility] OF [facility] & [address] [place] [address] Phone: [phone] Fax: [phone]  Is this the correct pharmacy for this prescription? Yes If no, delete pharmacy and type the correct one.   Has the prescription been filled recently? No  Is the patient out of the medication? No  Has the patient been seen for an appointment in the last year OR does the patient have an upcoming appointment? Yes  Can we respond through MyChart? Yes  Agent: Please be advised that Rx refills may take up to 3 business days. We ask that you follow-up with your pharmacy.

## 2023-09-13 NOTE — Telephone Encounter (Signed)
   PA on file. PA expires 02/27/24.

## 2023-09-13 NOTE — Telephone Encounter (Signed)
 Pharmacy Patient Advocate Encounter   Received notification from Onbase that prior authorization for Trulicity  3 is required/requested.   Insurance verification completed.   The patient is insured through [employer] .   Per test claim: The current 28 day co-pay is, $25.00.  No PA needed at this time. This test claim was processed through [facility]- copay amounts may vary at other pharmacies due to pharmacy/plan contracts, or as the patient moves through the different stages of their insurance plan.

## 2023-09-13 NOTE — Telephone Encounter (Signed)
 PA request has been Received. New Encounter has been or will be created for follow up. For additional info see Pharmacy Prior Auth telephone encounter from 09/13/23.

## 2023-09-13 NOTE — Telephone Encounter (Signed)
 Copied from CRM [id number]. Topic: Clinical - Medication Prior Auth >> Sep 13, 2023 10:43 AM Jasmin G wrote: Reason for CRM: Pt needs prior authorization on Dulaglutide  (TRULICITY ) 3 MG/0.5ML SOAJ. Please call pt back if needed

## 2023-09-26 ENCOUNTER — Other Ambulatory Visit: Payer: Self-pay | Admitting: Family Medicine

## 2023-09-26 ENCOUNTER — Ambulatory Visit: Admitting: Family Medicine

## 2023-09-26 ENCOUNTER — Encounter: Payer: Self-pay | Admitting: Family Medicine

## 2023-09-26 VITALS — BP 133/84 | Resp 18 | Ht 76.0 in | Wt 373.4 lb

## 2023-09-26 DIAGNOSIS — Z7985 Long-term (current) use of injectable non-insulin antidiabetic drugs: Secondary | ICD-10-CM

## 2023-09-26 DIAGNOSIS — J309 Allergic rhinitis, unspecified: Secondary | ICD-10-CM | POA: Diagnosis not present

## 2023-09-26 DIAGNOSIS — G8929 Other chronic pain: Secondary | ICD-10-CM

## 2023-09-26 DIAGNOSIS — I10 Essential (primary) hypertension: Secondary | ICD-10-CM

## 2023-09-26 DIAGNOSIS — E1165 Type 2 diabetes mellitus with hyperglycemia: Secondary | ICD-10-CM

## 2023-09-26 DIAGNOSIS — M25561 Pain in right knee: Secondary | ICD-10-CM

## 2023-09-26 DIAGNOSIS — M25562 Pain in left knee: Secondary | ICD-10-CM

## 2023-09-26 DIAGNOSIS — E781 Pure hyperglyceridemia: Secondary | ICD-10-CM

## 2023-09-26 MED ORDER — CELECOXIB 100 MG PO CAPS: 100.0000 mg | ORAL_CAPSULE | Freq: Two times a day (BID) | ORAL | 1 refills | Status: DC

## 2023-09-26 MED ORDER — AZELASTINE HCL 0.1 % NA SOLN: 2.0000 | Freq: Two times a day (BID) | NASAL | 11 refills | Status: AC

## 2023-09-26 MED ORDER — LEVOCETIRIZINE DIHYDROCHLORIDE 5 MG PO TABS: 5.0000 mg | ORAL_TABLET | Freq: Every evening | ORAL | 1 refills | Status: AC

## 2023-09-26 MED ORDER — ROSUVASTATIN CALCIUM 20 MG PO TABS: 20.0000 mg | ORAL_TABLET | Freq: Every day | ORAL | 0 refills | Status: DC

## 2023-09-26 MED ORDER — LISINOPRIL-HYDROCHLOROTHIAZIDE 20-12.5 MG PO TABS: 1.0000 | ORAL_TABLET | Freq: Every day | ORAL | 0 refills | Status: DC

## 2023-09-26 MED ORDER — AMLODIPINE BESYLATE 5 MG PO TABS: 5.0000 mg | ORAL_TABLET | Freq: Every day | ORAL | 0 refills | Status: DC

## 2023-09-26 NOTE — Assessment & Plan Note (Signed)
 Managed with rosuvastatin  20 mg daily. Due for a refill. - Refill rosuvastatin  20 mg daily.

## 2023-09-26 NOTE — Progress Notes (Signed)
 Established Patient Office Visit  Subjective   Patient ID: [name] [name], male    DOB: [birth year]  Age: 43 y.o. MRN: [id number]  Chief Complaint  Patient presents with   Medical Management of Chronic Issues   Diabetes    HPI  Patient is here for routine follow-up. Reports he has been doing well overall, trying hard to lose weight. ' His allergies have been flaring up - requesting recommendations on different allergy meds; had been taking Allegra  for awhile.    Hypertension: - Medications: amlodipine  5 mg daily, lisinopril -hctz 20-12.5 mg daily - Compliance: good - Checking BP at home: rarely - Denies any SOB, recurrent headaches, CP, vision changes, LE edema, dizziness, palpitations, or medication side effects. - Diet: low sodium, low carb - Exercise: walking   Diabetes, Obesity: - Checking BG at home: <100 - Medications: Tulicity 3 mg/week, rosuvastatin  20 mg daily - Compliance: good - Diet: low solidum, low carb - Exercise: walking - Eye exam: requesting records - Foot exam: today - Microalbumin: today - Denies symptoms of hypoglycemia, polyuria, polydipsia, numbness extremities, foot ulcers/trauma, wounds that are not healing, medication side effects  Lab Results  Component Value Date   HGBA1C 6.8 (H) 02/15/2023   Wt Readings from Last 3 Encounters:  09/26/23 (!) 373 lb 6.4 oz (169.4 kg)  05/20/23 (!) 412 lb 4.2 oz (187 kg)  02/21/23 (!) 412 lb 14.8 oz (187.3 kg)     Hyperlipidemia/Hypertriglyceridemia/CVA 2012: - medications: rosuvastatin  20 mg daily,  - compliance: good - medication SEs: none The ASCVD Risk score (Arnett DK, et al., 2019) failed to calculate for the following reasons:   Risk score cannot be calculated because patient has a medical history suggesting prior/existing ASCVD      ROS All review of systems negative except what is listed in the HPI    Objective:     BP 133/84   Resp 18   Ht 6' 4 (1.93 m)   Wt (!) 373 lb 6.4  oz (169.4 kg)   BMI 45.45 kg/m    Physical Exam Vitals reviewed.  Constitutional:      Appearance: Normal appearance. He is obese.  HENT:     Head: Normocephalic and atraumatic.  Cardiovascular:     Rate and Rhythm: Normal rate and regular rhythm.  Pulmonary:     Effort: Pulmonary effort is normal.     Breath sounds: Normal breath sounds.  Musculoskeletal:        General: Normal range of motion.  Skin:    General: Skin is warm and dry.  Neurological:     Mental Status: He is alert.  Psychiatric:        Mood and Affect: Mood normal.        Behavior: Behavior normal.        Thought Content: Thought content normal.        Judgment: Judgment normal.    Diabetic foot exam was performed.  No deformities or other abnormal visual findings.  Posterior tibialis and dorsalis pulse intact bilaterally.  Intact to touch and monofilament testing bilaterally.    No results found for any visits on 09/26/23.    The ASCVD Risk score (Arnett DK, et al., 2019) failed to calculate for the following reasons:   Risk score cannot be calculated because patient has a medical history suggesting prior/existing ASCVD    Assessment & Plan:   Problem List Items Addressed This Visit       Active Problems  Hypertension (Chronic)   Blood pressure is at goal for age and co-morbidities.   Recommendations: continue amlodipine  5 mg daily, lisinopril -hctz 20-12.5 mg daily - BP goal <130/80 - monitor and log blood pressures at home - check around the same time each day in a relaxed setting - Limit salt to <2000 mg/day - Follow DASH eating plan (heart healthy diet) - limit alcohol to 2 standard drinks per day for men and 1 per day for women - avoid tobacco products - get at least 2 hours of regular aerobic exercise weekly Patient aware of signs/symptoms requiring further/urgent evaluation. Labs updated today.       Relevant Medications   amLODipine  (NORVASC ) 5 MG tablet    lisinopril -hydrochlorothiazide  (ZESTORETIC ) 20-12.5 MG tablet   rosuvastatin  (CRESTOR ) 20 MG tablet   Other Relevant Orders   Comprehensive metabolic panel with GFR   Type 2 diabetes mellitus with hyperglycemia, without long-term current use of insulin  (HCC) - Primary (Chronic)   Managed with Trulicity  3 mg weekly. Reports good glycemic control with blood glucose levels consistently in the 80s. Recent weight loss positively impacts diabetes management. - Refill Trulicity  after lab results - Check A1c levels. - Encourage continued healthy eating and exercise.      Relevant Medications   lisinopril -hydrochlorothiazide  (ZESTORETIC ) 20-12.5 MG tablet   rosuvastatin  (CRESTOR ) 20 MG tablet   Other Relevant Orders   Hemoglobin A1c   Comprehensive metabolic panel with GFR   Microalbumin / creatinine urine ratio   Hypertriglyceridemia (Chronic)   Managed with rosuvastatin  20 mg daily. Due for a refill. - Refill rosuvastatin  20 mg daily.      Relevant Medications   amLODipine  (NORVASC ) 5 MG tablet   lisinopril -hydrochlorothiazide  (ZESTORETIC ) 20-12.5 MG tablet   rosuvastatin  (CRESTOR ) 20 MG tablet   Other Relevant Orders   Lipid panel   Other Visit Diagnoses       Allergic rhinitis, unspecified seasonality, unspecified trigger     Recommend trying Xyzal  and Astelin . Reports Flonase  triggered nose bleeds.   Relevant Medications   levocetirizine (XYZAL ) 5 MG tablet   azelastine  (ASTELIN ) 0.1 % nasal spray     Chronic pain of both knees       Relevant Medications   celecoxib  (CELEBREX ) 100 MG capsule         Return in about 6 months (around 03/28/2024) for physical.    Waddell KATHEE Mon, NP

## 2023-09-26 NOTE — Assessment & Plan Note (Signed)
 Managed with Trulicity  3 mg weekly. Reports good glycemic control with blood glucose levels consistently in the 80s. Recent weight loss positively impacts diabetes management. - Refill Trulicity  after lab results - Check A1c levels. - Encourage continued healthy eating and exercise.

## 2023-09-26 NOTE — Assessment & Plan Note (Signed)
 Blood pressure is at goal for age and co-morbidities.   Recommendations: continue amlodipine  5 mg daily, lisinopril -hctz 20-12.5 mg daily - BP goal <130/80 - monitor and log blood pressures at home - check around the same time each day in a relaxed setting - Limit salt to <2000 mg/day - Follow DASH eating plan (heart healthy diet) - limit alcohol to 2 standard drinks per day for men and 1 per day for women - avoid tobacco products - get at least 2 hours of regular aerobic exercise weekly Patient aware of signs/symptoms requiring further/urgent evaluation. Labs updated today.

## 2023-09-27 ENCOUNTER — Ambulatory Visit: Payer: Self-pay | Admitting: Family Medicine

## 2023-09-27 DIAGNOSIS — E1165 Type 2 diabetes mellitus with hyperglycemia: Secondary | ICD-10-CM

## 2023-09-27 LAB — COMPREHENSIVE METABOLIC PANEL WITH GFR
ALT: 25 U/L (ref 0–53)
AST: 32 U/L (ref 0–37)
Albumin: 4.3 g/dL (ref 3.5–5.2)
Alkaline Phosphatase: 73 U/L (ref 39–117)
BUN: 10 mg/dL (ref 6–23)
CO2: 27 meq/L (ref 19–32)
Calcium: 9.4 mg/dL (ref 8.4–10.5)
Chloride: 102 meq/L (ref 96–112)
Creatinine, Ser: 1.11 mg/dL (ref 0.40–1.50)
GFR: 80.58 mL/min (ref >60.00)
Glucose, Bld: 131 mg/dL — ABNORMAL HIGH (ref 70–99)
Potassium: 3.4 meq/L — ABNORMAL LOW (ref 3.5–5.1)
Sodium: 140 meq/L (ref 135–145)
Total Bilirubin: 0.6 mg/dL (ref 0.2–1.2)
Total Protein: 6.9 g/dL (ref 6.0–8.3)

## 2023-09-27 LAB — MICROALBUMIN / CREATININE URINE RATIO
Creatinine,U: 209.7 mg/dL
Microalb Creat Ratio: 10.8 mg/g (ref 0.0–30.0)
Microalb, Ur: 2.3 mg/dL — ABNORMAL HIGH (ref 0.0–1.9)

## 2023-09-27 LAB — TIQ-NTM

## 2023-09-27 LAB — HEMOGLOBIN A1C: Hgb A1c MFr Bld: 6.4 % (ref 4.6–6.5)

## 2023-09-28 ENCOUNTER — Other Ambulatory Visit: Payer: Self-pay | Admitting: Family Medicine

## 2023-09-28 DIAGNOSIS — I10 Essential (primary) hypertension: Secondary | ICD-10-CM

## 2023-09-28 LAB — LIPID PANEL
Cholesterol: 114 mg/dL (ref <200)
HDL: 40 mg/dL (ref >=40)
LDL Cholesterol (Calc): 53 mg/dL
Non-HDL Cholesterol (Calc): 74 mg/dL (ref <130)
Total CHOL/HDL Ratio: 2.9 (calc) (ref <5.0)
Triglycerides: 133 mg/dL (ref <150)

## 2023-09-28 MED ORDER — TRULICITY 3 MG/0.5ML ~~LOC~~ SOAJ
3.0000 mg | SUBCUTANEOUS | 1 refills | Status: AC
Start: 2023-09-28 — End: ?

## 2023-12-17 ENCOUNTER — Encounter: Payer: Self-pay | Admitting: Radiology

## 2023-12-31 ENCOUNTER — Emergency Department (HOSPITAL_COMMUNITY): Payer: Self-pay

## 2023-12-31 ENCOUNTER — Encounter (HOSPITAL_COMMUNITY): Payer: Self-pay | Admitting: Emergency Medicine

## 2023-12-31 ENCOUNTER — Other Ambulatory Visit: Payer: Self-pay

## 2023-12-31 ENCOUNTER — Inpatient Hospital Stay (HOSPITAL_COMMUNITY)
Admission: EM | Admit: 2023-12-31 | Discharge: 2024-01-03 | DRG: 204 | Disposition: A | Discharge status: Home / Self Care | Payer: Self-pay | Attending: Internal Medicine | Admitting: Internal Medicine

## 2023-12-31 DIAGNOSIS — E7800 Pure hypercholesterolemia, unspecified: Secondary | ICD-10-CM | POA: Diagnosis present

## 2023-12-31 DIAGNOSIS — E781 Pure hyperglyceridemia: Secondary | ICD-10-CM | POA: Diagnosis present

## 2023-12-31 DIAGNOSIS — Z833 Family history of diabetes mellitus: Secondary | ICD-10-CM

## 2023-12-31 DIAGNOSIS — F17290 Nicotine dependence, other tobacco product, uncomplicated: Secondary | ICD-10-CM | POA: Diagnosis present

## 2023-12-31 DIAGNOSIS — I2690 Septic pulmonary embolism without acute cor pulmonale: Secondary | ICD-10-CM | POA: Diagnosis present

## 2023-12-31 DIAGNOSIS — G4733 Obstructive sleep apnea (adult) (pediatric): Secondary | ICD-10-CM | POA: Diagnosis present

## 2023-12-31 DIAGNOSIS — Z720 Tobacco use: Secondary | ICD-10-CM | POA: Diagnosis present

## 2023-12-31 DIAGNOSIS — E66813 Obesity, class 3: Secondary | ICD-10-CM | POA: Diagnosis present

## 2023-12-31 DIAGNOSIS — Z6841 Body Mass Index (BMI) 40.0 and over, adult: Secondary | ICD-10-CM

## 2023-12-31 DIAGNOSIS — E1165 Type 2 diabetes mellitus with hyperglycemia: Secondary | ICD-10-CM | POA: Diagnosis present

## 2023-12-31 DIAGNOSIS — I82409 Acute embolism and thrombosis of unspecified deep veins of unspecified lower extremity: Secondary | ICD-10-CM | POA: Diagnosis present

## 2023-12-31 DIAGNOSIS — I10 Essential (primary) hypertension: Secondary | ICD-10-CM | POA: Diagnosis present

## 2023-12-31 DIAGNOSIS — Z8673 Personal history of transient ischemic attack (TIA), and cerebral infarction without residual deficits: Secondary | ICD-10-CM

## 2023-12-31 DIAGNOSIS — Z91014 Allergy to mammalian meats: Secondary | ICD-10-CM

## 2023-12-31 DIAGNOSIS — I82452 Acute embolism and thrombosis of left peroneal vein: Secondary | ICD-10-CM | POA: Diagnosis present

## 2023-12-31 DIAGNOSIS — Z79899 Other long term (current) drug therapy: Secondary | ICD-10-CM

## 2023-12-31 DIAGNOSIS — R079 Chest pain, unspecified: Secondary | ICD-10-CM

## 2023-12-31 DIAGNOSIS — R042 Hemoptysis: Principal | ICD-10-CM | POA: Diagnosis present

## 2023-12-31 DIAGNOSIS — I76 Septic arterial embolism: Principal | ICD-10-CM

## 2023-12-31 DIAGNOSIS — Z7985 Long-term (current) use of injectable non-insulin antidiabetic drugs: Secondary | ICD-10-CM

## 2023-12-31 DIAGNOSIS — F1290 Cannabis use, unspecified, uncomplicated: Secondary | ICD-10-CM | POA: Diagnosis present

## 2023-12-31 DIAGNOSIS — R0602 Shortness of breath: Secondary | ICD-10-CM

## 2023-12-31 DIAGNOSIS — E876 Hypokalemia: Secondary | ICD-10-CM | POA: Diagnosis present

## 2023-12-31 LAB — BASIC METABOLIC PANEL WITH GFR
Anion gap: 11 (ref 5–15)
BUN: 8 mg/dL (ref 6–20)
CO2: 26 mmol/L (ref 22–32)
Calcium: 9.5 mg/dL (ref 8.9–10.3)
Chloride: 99 mmol/L (ref 98–111)
Creatinine, Ser: 1.06 mg/dL (ref 0.61–1.24)
GFR, Estimated: >60 mL/min (ref >60)
Glucose, Bld: 112 mg/dL — ABNORMAL HIGH (ref 70–99)
Potassium: 3.6 mmol/L (ref 3.5–5.1)
Sodium: 136 mmol/L (ref 135–145)

## 2023-12-31 LAB — CBC
HCT: 49.1 % (ref 39.0–52.0)
Hemoglobin: 16.3 g/dL (ref 13.0–17.0)
MCH: 28.8 pg (ref 26.0–34.0)
MCHC: 33.2 g/dL (ref 30.0–36.0)
MCV: 86.7 fL (ref 80.0–100.0)
Platelets: 198 K/uL (ref 150–400)
RBC: 5.66 MIL/uL (ref 4.22–5.81)
RDW: 13.6 % (ref 11.5–15.5)
WBC: 17.8 K/uL — ABNORMAL HIGH (ref 4.0–10.5)
nRBC: 0 % (ref 0.0–0.2)

## 2023-12-31 LAB — TROPONIN T, HIGH SENSITIVITY: Troponin T High Sensitivity: <15 ng/L (ref 0–19)

## 2023-12-31 MED ORDER — SENNOSIDES-DOCUSATE SODIUM 8.6-50 MG PO TABS: 1.0000 | ORAL_TABLET | Freq: Every evening | ORAL | Status: DC | PRN

## 2023-12-31 MED ORDER — ACETAMINOPHEN 325 MG PO TABS
650.0000 mg | ORAL_TABLET | Freq: Four times a day (QID) | ORAL | Status: DC | PRN
  Administered 2024-01-01 (×2): 650 mg via ORAL
  Filled 2023-12-31 (×2): qty 2

## 2023-12-31 MED ORDER — IOHEXOL 350 MG/ML SOLN
75.0000 mL | Freq: Once | INTRAVENOUS | Status: AC | PRN
  Administered 2023-12-31: 75 mL via INTRAVENOUS

## 2023-12-31 MED ORDER — SODIUM CHLORIDE 0.9% FLUSH
3.0000 mL | Freq: Two times a day (BID) | INTRAVENOUS | Status: DC
  Administered 2024-01-01 – 2024-01-03 (×6): 3 mL via INTRAVENOUS

## 2023-12-31 MED ORDER — VANCOMYCIN HCL 2000 MG/400ML IV SOLN
2000.0000 mg | Freq: Once | INTRAVENOUS | Status: AC
  Administered 2023-12-31: 2000 mg via INTRAVENOUS
  Filled 2023-12-31: qty 400

## 2023-12-31 MED ORDER — ACETAMINOPHEN 650 MG RE SUPP
650.0000 mg | Freq: Four times a day (QID) | RECTAL | Status: DC | PRN
Start: 2023-12-31 — End: 2024-01-03

## 2023-12-31 MED ORDER — IOHEXOL 300 MG/ML  SOLN: 75.0000 mL | Freq: Once | INTRAMUSCULAR | Status: DC | PRN

## 2023-12-31 MED ORDER — PIPERACILLIN-TAZOBACTAM 3.375 G IVPB 30 MIN
3.3750 g | Freq: Once | INTRAVENOUS | Status: AC
  Administered 2023-12-31: 3.375 g via INTRAVENOUS
  Filled 2023-12-31: qty 50

## 2023-12-31 NOTE — H&P (Signed)
 History and Physical    [name] [name] FMW:[id number] DOB: [birth year] DOA: 12/31/2023  DOS: the patient was seen and examined on 12/31/2023  PCP: Almarie Waddell NOVAK, NP   Patient coming from: Home  I have personally briefly reviewed patient's old medical records in [facility] and CareEverywhere  HPI:   [name] [name] is a 43 y.o. year old male with medical history of hypertension, hyperlipidemia, type 2 diabetes, class III obesity, tobacco use disorder presenting to the ED with dyspnea and chest pain.   Pt states   On arrival to the ED patient was noted to be HDS stable.  Lab work and imaging obtained.  CBC with leukocytosis.  BMP with mild hyperglycemia otherwise unremarkable.  Troponin checked and negative.  Blood cultures obtained.  Chest x-ray concerning for developing patchy airspace opacity in the right lung but given right-sided pleuritic chest pain, CTA obtained that showed concern for septic emboli but no PE. Pt started on broad spectrum abx, and TR[facility] contacted for admission.  Review of Systems: As mentioned in the history of present illness. All other systems reviewed and are negative.   Past Medical History:  Diagnosis Date   Arthritis    Diabetes mellitus without complication (HCC)    Type 2   Hypercholesteremia    Hypertension    Pneumonia    Dec. 2023   Sleep apnea    Stroke (HCC) 2012   Bell's Palsy Deficit. Now resolved   TIA (transient ischemic attack) 2012    Past Surgical History:  Procedure Laterality Date   FOOT SURGERY Left 2004   KNEE SURGERY Left 2004   ORIF RADIUS & ULNA FRACTURES Left 2004   ORIF TIBIA & FIBULA FRACTURES Left 2004   ORTHOPEDIC SURGERY     PILONIDAL CYST EXCISION N/A 06/29/2022   Procedure: CYST EXCISION PILONIDAL EXTENSIVE;  Surgeon: [name], MD;  Location: [facility] OR;  Service: General;  Laterality: N/A;   SHOULDER SURGERY       Allergies  Allergen Reactions   Tea Anaphylaxis   Porcine (Pork)  Protein-Containing Drug Products     Patient said he does not eat pork    Family History  Problem Relation Age of Onset   Diabetes Mother    Healthy Father     Prior to Admission medications   Medication Sig Start Date End Date Taking? Authorizing Provider  albuterol  (VENTOLIN  HFA) 108 (90 Base) MCG/ACT inhaler Inhale 1-2 puffs into the lungs every 6 (six) hours as needed for wheezing or shortness of breath. 12/14/22  Yes Henderly, Britni A, PA-C  amLODipine  (NORVASC ) 5 MG tablet TAKE 1 TABLET(5 MG) BY MOUTH DAILY 09/28/23  Yes Almarie Waddell B, NP  Dulaglutide  (TRULICITY ) 3 MG/0.5ML SOAJ Inject 3 mg into the skin once a week. 09/28/23  Yes Almarie Waddell NOVAK, NP  lisinopril -hydrochlorothiazide  (ZESTORETIC ) 20-12.5 MG tablet TAKE 1 TABLET BY MOUTH DAILY 09/28/23  Yes Beck, Taylor B, NP  rosuvastatin  (CRESTOR ) 20 MG tablet Take 1 tablet (20 mg total) by mouth daily. 09/26/23  Yes Almarie Waddell NOVAK, NP  azelastine  (ASTELIN ) 0.1 % nasal spray Place 2 sprays into both nostrils 2 (two) times daily. Use in each nostril as directed Patient taking differently: Place 2 sprays into both nostrils as needed for rhinitis or allergies. Use in each nostril as directed 09/26/23   Almarie Waddell NOVAK, NP  celecoxib  (CELEBREX ) 100 MG capsule TAKE 1 CAPSULE(100 MG) BY MOUTH TWICE DAILY Patient not taking: Reported on 12/31/2023 09/26/23  Almarie Waddell NOVAK, NP  levocetirizine (XYZAL ) 5 MG tablet Take 1 tablet (5 mg total) by mouth every evening. Patient taking differently: Take 5 mg by mouth as needed for allergies. 09/26/23   Almarie Waddell NOVAK, NP    Social History:  reports that he has been smoking cigars. He has never used smokeless tobacco. He reports current alcohol use. He reports that he does not use drugs. Lives with Tobacco- *** ppd x **** years since age/ Denies use. EtOH- *** shots/ week, *** beers/week. Last drink was ***. Denies use.  Illicit drug use- denies use.  IADLs/ADLs- can perform independently at baseline     Physical Exam: Vitals:   12/31/23 1804 12/31/23 2145 12/31/23 2216 12/31/23 2217  BP: (!) 156/101  (!) 153/128   Pulse: 90  84 84  Resp: 16  16   Temp: 98.8 F (37.1 C)  99 F (37.2 C)   TempSrc: Oral     SpO2: 97%  100% 97%  Weight:  (!) 170 kg    Height:  6' 4 (1.93 m)       Gen: HENT: CV: Resp: Abd: MSK: Skin: Neuro: Psych:   Labs on Admission: I have personally reviewed following labs and imaging studies  CBC: Recent Labs  Lab 12/31/23 1818  WBC 17.8*  HGB 16.3  HCT 49.1  MCV 86.7  PLT 198   Basic Metabolic Panel: Recent Labs  Lab 12/31/23 1818  NA 136  K 3.6  CL 99  CO2 26  GLUCOSE 112*  BUN 8  CREATININE 1.06  CALCIUM  9.5   GFR: Estimated Creatinine Clearance: 149.5 mL/min (by C-G formula based on SCr of 1.06 mg/dL). Liver Function Tests: No results for input(s): AST, ALT, ALKPHOS, BILITOT, PROT, ALBUMIN in the last 168 hours. No results for input(s): LIPASE, AMYLASE in the last 168 hours. No results for input(s): AMMONIA  in the last 168 hours. Coagulation Profile: No results for input(s): INR, PROTIME in the last 168 hours. Cardiac Enzymes: No results for input(s): CKTOTAL, CKMB, CKMBINDEX, TROPONINI, TROPONINIHS in the last 168 hours. BNP (last 3 results) Recent Labs    02/21/23 1325  BNP 18.2   HbA1C: No results for input(s): HGBA1C in the last 72 hours. CBG: No results for input(s): GLUCAP in the last 168 hours. Lipid Profile: No results for input(s): CHOL, HDL, LDLCALC, TRIG, CHOLHDL, LDLDIRECT in the last 72 hours. Thyroid Function Tests: No results for input(s): TSH, T4TOTAL, FREET4, T3FREE, THYROIDAB in the last 72 hours. Anemia Panel: No results for input(s): VITAMINB12, FOLATE, FERRITIN, TIBC, IRON, RETICCTPCT in the last 72 hours. Urine analysis:    Component Value Date/Time   COLORURINE AMBER (A) 05/20/2023 1820   APPEARANCEUR CLOUDY (A)  05/20/2023 1820   LABSPEC 1.030 05/20/2023 1820   PHURINE 5.0 05/20/2023 1820   GLUCOSEU NEGATIVE 05/20/2023 1820   HGBUR NEGATIVE 05/20/2023 1820   BILIRUBINUR NEGATIVE 05/20/2023 1820   KETONESUR NEGATIVE 05/20/2023 1820   PROTEINUR 30 (A) 05/20/2023 1820   UROBILINOGEN 0.2 07/19/2010 0110   NITRITE NEGATIVE 05/20/2023 1820   LEUKOCYTESUR SMALL (A) 05/20/2023 1820    Radiological Exams on Admission: I have personally reviewed images CT Angio Chest PE W and/or Wo Contrast Result Date: 12/31/2023 EXAM: CTA CHEST 12/31/2023 08:23:04 PM TECHNIQUE: CTA of the chest was performed after the administration of 75 mL of iohexol  (OMNIPAQUE ) 350 MG/ML injection. Multiplanar reformatted images are provided for review. MIP images are provided for review. Automated exposure control, iterative reconstruction, and/or weight based adjustment  of the mA/kV was utilized to reduce the radiation dose to as low as reasonably achievable. COMPARISON: None available. CLINICAL HISTORY: pain when taking a deep breath, shortness of breath FINDINGS: PULMONARY ARTERIES: Pulmonary arteries are adequately opacified for evaluation. Limited evaluation of the subsegmental level for pulmonary embolus due to motion artifact. Main pulmonary artery is normal in caliber. MEDIASTINUM: The heart and pericardium demonstrate no acute abnormality. There is no acute abnormality of the thoracic aorta. LYMPH NODES: Enlarged right hilar and mediastinal lymphadenopathy, difficult to measure. No left hilar or axillary lymphadenopathy. LUNGS AND PLEURA: Bilateral patchy peribronchovascular centrally consolidative and peripherally ground glass airspace opacities. No cavitation. No evidence of pleural effusion or pneumothorax. UPPER ABDOMEN: Limited images of the upper abdomen are unremarkable. SOFT TISSUES AND BONES: No acute bone or soft tissue abnormality. IMPRESSION: 1. No pulmonary embolism identified; limited evaluation of the subsegmental level  due to motion artifact. 2. Bilateral patchy peribronchovascular consolidative and peripheral ground glass airspace opacities without cavitation. Findings concerning for septic emboli. 3. Enlarged right hilar and mediastinal lymphadenopathy, difficult to measure. Findings likely reactive in etiology. Electronically signed by: Morgane Naveau MD 12/31/2023 09:24 PM EST RP Workstation: [id number]   DG Chest 2 View Result Date: 12/31/2023 EXAM: 2 VIEW(S) X-RAY OF THE CHEST 12/31/2023 06:12:00 PM COMPARISON: 02/21/2023 CLINICAL HISTORY: SOB FINDINGS: LUNGS AND PLEURA: Patchy space opacity of the right lung.  No pleural effusion. No pneumothorax. HEART AND MEDIASTINUM: No acute abnormality of the cardiac and mediastinal silhouettes. BONES AND SOFT TISSUES: No acute osseous abnormality. IMPRESSION: 1. Suspected developing patchy airspace opacity in the right lung, which may represent infection; recommend follow-up PA and lateral chest radiograph in 34 weeks to ensure resolution. Electronically signed by: Morgane Naveau MD 12/31/2023 07:31 PM EST RP Workstation: [id number]    EKG: My personal interpretation of EKG shows: sinus rhythm without acute ST changes.     Assessment/Plan Active Problems:   * No active hospital problems. *     VTE prophylaxis:  Lovenox  Diet: HH Code Status:  Full Code Telemetry:  Admission status: Inpatient, Telemetry bed Patient is from: Home Anticipated d/c is to: Home Anticipated d/c is in: 2-3 days   Family Communication: Updated at bedside  Consults called: None   Severity of Illness: The appropriate patient status for this patient is INPATIENT. Inpatient status is judged to be reasonable and necessary in order to provide the required intensity of service to ensure the patient's safety. The patient's presenting symptoms, physical exam findings, and initial radiographic and laboratory data in the context of their chronic comorbidities is felt to place them at  high risk for further clinical deterioration. Furthermore, it is not anticipated that the patient will be medically stable for discharge from the hospital within 2 midnights of admission.   * I certify that at the point of admission it is my clinical judgment that the patient will require inpatient hospital care spanning beyond 2 midnights from the point of admission due to high intensity of service, high risk for further deterioration and high frequency of surveillance required.DEWAINE Morene Bathe, MD [place] DEL. [facility]

## 2023-12-31 NOTE — ED Triage Notes (Signed)
 Patient c/o SOB and chest pain x 4 days. Patient report chest pain when taking a deep breath. Patient denies N/V.

## 2023-12-31 NOTE — Progress Notes (Signed)
 ED Pharmacy Antibiotic Sign Off An antibiotic consult was received from an ED provider for Vancomycin  and Zosyn per pharmacy dosing for septic emboli. A chart review was completed to assess appropriateness.   The following one time order(s) were placed:  Vancomycin  2g IV Zosyn 3.375g IV  Further antibiotic and/or antibiotic pharmacy consults should be ordered by the admitting provider if indicated.   Thank you for allowing pharmacy to be a part of this patient's care.   Arvin Gauss, PharmD Clinical Pharmacist 12/31/23 9:53 PM

## 2023-12-31 NOTE — ED Provider Notes (Signed)
 [place] EMERGENCY DEPARTMENT AT [facility] Provider Note   CSN: [id number] Arrival date & time: 12/31/23  1718     Patient presents with: Shortness of Breath and Chest Pain   [name] [name] is a 43 y.o. male who presents to the emergency department with a chief complaint of chest pain as well as shortness of breath since Saturday.  Patient states that he took a trip to [place] with his wife and that his shortness of breath began on Saturday.  Patient describes the shortness of breath as worse with exertion.  Denies history of heart failure or history of blood clot.  Patient states that he does have chest pain that is worse on the right side when taking a deep breath.  Denies recent long travel, recent surgery, history of DVT, hormone replacement therapy, calf pain, redness, or swelling.  Patient also does appreciate some chills but denies known fever or cough.  Patient does have a history of asthma however states that he has not had an asthma exacerbation in many years and has not had to use his albuterol  inhaler.  Past medical history significant for hypertension, type 2 diabetes, hypertriglyceridemia, tobacco abuse, suicide attempt, major depressive disorder, obesity, stroke, TIA, pneumonia, etc.  {Add pertinent medical, surgical, social history, OB history to HPI:32947}  Shortness of Breath Associated symptoms: chest pain   Chest Pain Associated symptoms: shortness of breath        Prior to Admission medications   Medication Sig Start Date End Date Taking? Authorizing Provider  albuterol  (VENTOLIN  HFA) 108 (90 Base) MCG/ACT inhaler Inhale 1-2 puffs into the lungs every 6 (six) hours as needed for wheezing or shortness of breath. 12/14/22   Henderly, Britni A, PA-C  amLODipine  (NORVASC ) 5 MG tablet TAKE 1 TABLET(5 MG) BY MOUTH DAILY 09/28/23   Almarie Birmingham B, NP  azelastine  (ASTELIN ) 0.1 % nasal spray Place 2 sprays into both nostrils 2 (two) times daily. Use in  each nostril as directed 09/26/23   Almarie Birmingham B, NP  celecoxib  (CELEBREX ) 100 MG capsule TAKE 1 CAPSULE(100 MG) BY MOUTH TWICE DAILY 09/26/23   Almarie Birmingham NOVAK, NP  Dulaglutide  (TRULICITY ) 3 MG/0.5ML SOAJ Inject 3 mg into the skin once a week. 09/28/23   Almarie Birmingham NOVAK, NP  levocetirizine (XYZAL ) 5 MG tablet Take 1 tablet (5 mg total) by mouth every evening. 09/26/23   Almarie Birmingham NOVAK, NP  lisinopril -hydrochlorothiazide  (ZESTORETIC ) 20-12.5 MG tablet TAKE 1 TABLET BY MOUTH DAILY 09/28/23   Beck, Taylor B, NP  rosuvastatin  (CRESTOR ) 20 MG tablet Take 1 tablet (20 mg total) by mouth daily. 09/26/23   Almarie Birmingham NOVAK, NP    Allergies: Tea and Porcine (pork) protein-containing drug products    Review of Systems  Respiratory:  Positive for shortness of breath.   Cardiovascular:  Positive for chest pain.    Updated Vital Signs BP (!) 156/101 (BP Location: Left Arm)   Pulse 90   Temp 98.8 F (37.1 C) (Oral)   Resp 16   Ht 6' 4 (1.93 m)   Wt (!) 170 kg   SpO2 97%   BMI 45.62 kg/m   Physical Exam Vitals and nursing note reviewed.  Constitutional:      General: He is awake.     Appearance: He is well-developed.  Cardiovascular:     Rate and Rhythm: Normal rate and regular rhythm.  Pulmonary:     Effort: No tachypnea or respiratory distress.     Breath sounds:  Normal breath sounds. No decreased breath sounds, wheezing, rhonchi or rales.  Chest:     Chest wall: No tenderness.  Abdominal:     Palpations: Abdomen is soft.     Tenderness: There is no abdominal tenderness.  Musculoskeletal:     Right lower leg: No edema.     Left lower leg: No edema.  Skin:    General: Skin is warm.     Capillary Refill: Capillary refill takes less than 2 seconds.  Neurological:     General: No focal deficit present.     Mental Status: He is alert and oriented to person, place, and time.  Psychiatric:        Mood and Affect: Mood normal.        Behavior: Behavior normal. Behavior is cooperative.      (all labs ordered are listed, but only abnormal results are displayed) Labs Reviewed  BASIC METABOLIC PANEL WITH GFR - Abnormal; Notable for the following components:      Result Value   Glucose, Bld 112 (*)    All other components within normal limits  CBC - Abnormal; Notable for the following components:   WBC 17.8 (*)    All other components within normal limits  CULTURE, BLOOD (ROUTINE X 2)  CULTURE, BLOOD (ROUTINE X 2)  TROPONIN T, HIGH SENSITIVITY    EKG: EKG Interpretation Date/Time:  Monday December 31 2023 18:02:25 EST Ventricular Rate:  88 PR Interval:  141 QRS Duration:  83 QT Interval:  342 QTC Calculation: 414 R Axis:   55  Text Interpretation: Sinus rhythm Borderline T wave abnormalities No significant change since last tracing Confirmed by Randol Simmonds [id number]) on 12/31/2023 6:54:39 PM  Radiology: CT Angio Chest PE W and/or Wo Contrast Result Date: 12/31/2023 EXAM: CTA CHEST 12/31/2023 08:23:04 PM TECHNIQUE: CTA of the chest was performed after the administration of 75 mL of iohexol  (OMNIPAQUE ) 350 MG/ML injection. Multiplanar reformatted images are provided for review. MIP images are provided for review. Automated exposure control, iterative reconstruction, and/or weight based adjustment of the mA/kV was utilized to reduce the radiation dose to as low as reasonably achievable. COMPARISON: None available. CLINICAL HISTORY: pain when taking a deep breath, shortness of breath FINDINGS: PULMONARY ARTERIES: Pulmonary arteries are adequately opacified for evaluation. Limited evaluation of the subsegmental level for pulmonary embolus due to motion artifact. Main pulmonary artery is normal in caliber. MEDIASTINUM: The heart and pericardium demonstrate no acute abnormality. There is no acute abnormality of the thoracic aorta. LYMPH NODES: Enlarged right hilar and mediastinal lymphadenopathy, difficult to measure. No left hilar or axillary lymphadenopathy. LUNGS AND PLEURA:  Bilateral patchy peribronchovascular centrally consolidative and peripherally ground glass airspace opacities. No cavitation. No evidence of pleural effusion or pneumothorax. UPPER ABDOMEN: Limited images of the upper abdomen are unremarkable. SOFT TISSUES AND BONES: No acute bone or soft tissue abnormality. IMPRESSION: 1. No pulmonary embolism identified; limited evaluation of the subsegmental level due to motion artifact. 2. Bilateral patchy peribronchovascular consolidative and peripheral ground glass airspace opacities without cavitation. Findings concerning for septic emboli. 3. Enlarged right hilar and mediastinal lymphadenopathy, difficult to measure. Findings likely reactive in etiology. Electronically signed by: Morgane Naveau MD 12/31/2023 09:24 PM EST RP Workstation: [id number]   DG Chest 2 View Result Date: 12/31/2023 EXAM: 2 VIEW(S) X-RAY OF THE CHEST 12/31/2023 06:12:00 PM COMPARISON: 02/21/2023 CLINICAL HISTORY: SOB FINDINGS: LUNGS AND PLEURA: Patchy space opacity of the right lung.  No pleural effusion. No pneumothorax. HEART AND MEDIASTINUM: No acute  abnormality of the cardiac and mediastinal silhouettes. BONES AND SOFT TISSUES: No acute osseous abnormality. IMPRESSION: 1. Suspected developing patchy airspace opacity in the right lung, which may represent infection; recommend follow-up PA and lateral chest radiograph in 34 weeks to ensure resolution. Electronically signed by: Morgane Naveau MD 12/31/2023 07:31 PM EST RP Workstation: [id number]    {Document cardiac monitor, telemetry assessment procedure when appropriate:32947} Procedures   Medications Ordered in the ED  vancomycin  (VANCOREADY) IVPB 2000 mg/400 mL (has no administration in time range)  piperacillin-tazobactam (ZOSYN) IVPB 3.375 g (has no administration in time range)  iohexol  (OMNIPAQUE ) 350 MG/ML injection 75 mL (75 mLs Intravenous Contrast Given 12/31/23 2015)      {Click here for ABCD2, HEART and other  calculators REFRESH Note before signing:1}                              Medical Decision Making Amount and/or Complexity of Data Reviewed Labs: ordered. Radiology: ordered.  Risk Prescription drug management.   Patient presents to the ED for concern of chest pain/shortness of breath, this involves an extensive number of treatment options, and is a complaint that carries with it a high risk of complications and morbidity.  The differential diagnosis includes ACS, pneumonia, pneumothorax, PE, septic emobli, etc.    Co morbidities that complicate the patient evaluation  Hypertension, type 2 diabetes, hypertriglyceridemia, tobacco use   Lab Tests:  I Ordered, and personally interpreted labs.  The pertinent results include: CBC significant for white blood cell count of 17.8, BMP unremarkable, blood cultures pending, troponin less than 15   Imaging Studies ordered:  I ordered imaging studies including CT angio chest PE, chest x-ray I independently visualized and interpreted imaging which showed: Chest x-ray: Possible developing patchy airspace opacity in the right lung, no present infection, possible pneumonia CT PE study: Possible septic emboli, lymphadenopathy, no evidence of PE I agree with the radiologist interpretation   Cardiac Monitoring:  The patient was maintained on a cardiac monitor.  I personally viewed and interpreted the cardiac monitored which showed an underlying rhythm of: Sinus rhythm   Medicines ordered and prescription drug management:  I ordered medication including Vanco and Zosyn for possible septic emboli Reevaluation of the patient after these medicines showed that the patient stayed the same I have reviewed the patients home medicines and have made adjustments as needed   Test Considered:  None   Critical Interventions:  None   Consultations Obtained:  I requested consultation with the hospitalist team,  and discussed lab and imaging  findings as well as pertinent plan - they recommend: Admission to hospital for ongoing diagnosis and treatment   Problem List / ED Course:  43 year old male, vital signs stable, presents emergency department for chief complaint of chest pain as well as shortness of breath Patient talking full sentences on room air, no obvious abnormality to auscultation of heart or lungs Patient initially assessed by myself in triage, shortness of breath and chest pain orders placed, chest x-ray questionable for possible pneumonia Troponin reassuring, canceled repeat troponin as patient has had consistent chest pain for the last few days, CBC significant for white blood cell count of 17.8 CT PE scan ordered due to patient continuing to have pain when taking a deep breath and also continuing to complain of shortness of breath, CT PE scan shows possible septic emboli, patient denies history of IV drug use  Blood cultures ordered Vanc and  Zosyn ordered Consult to hospitalist for admission made, patient admitted   Reevaluation:  After the interventions noted above, I reevaluated the patient and found that they have :stayed the same   Social Determinants of Health:  None   Dispostion:  After consideration of the diagnostic results and the patients response to treatment, I feel that the patient would benefit from admission to the hospital for ongoing diagnosis and treatment  {Document critical care time when appropriate  Document review of labs and clinical decision tools ie CHADS2VASC2, etc  Document your independent review of radiology images and any outside records  Document your discussion with family members, caretakers and with consultants  Document social determinants of health affecting pt's care  Document your decision making why or why not admission, treatments were needed:32947:::1}   Final diagnoses:  Septic embolism (HCC)  Shortness of breath  Chest pain, unspecified type    ED  Discharge Orders     None

## 2023-12-31 NOTE — ED Provider Triage Note (Signed)
 Emergency Medicine Provider Triage Evaluation Note  [name] [name] , a 43 y.o. male  was evaluated in triage.  Pt complains of shortness of breath and chest pain since Saturday. Patient states he does have chest pain when taking a deep breath worse on the right side of his chest. Denies blood thinning medication. Denies hx of blood clot. Does appreciate chills but denies fever or cough.   Review of Systems  Positive: Shortness of breath, chest pain, chills Negative: Fever, nausea, vomiting, abdominal pain  Physical Exam  BP (!) 156/101 (BP Location: Left Arm)   Pulse 90   Temp 98.8 F (37.1 C) (Oral)   Resp 16   SpO2 97%  Gen:   Awake, no distress   Resp:  Normal effort, patient talking in full sentences on room air.  MSK:   Moves extremities without difficulty  Other:    Medical Decision Making  Medically screening exam initiated at 6:37 PM.  Appropriate orders placed.  [name] [name] was informed that the remainder of the evaluation will be completed by another provider, this initial triage assessment does not replace that evaluation, and the importance of remaining in the ED until their evaluation is complete.  Orders: CBC, BMP, CXR, EKG, troponin   Janetta Terrall [place], [place] 12/31/23 1845

## 2024-01-01 ENCOUNTER — Inpatient Hospital Stay (HOSPITAL_COMMUNITY): Payer: Self-pay

## 2024-01-01 ENCOUNTER — Encounter (HOSPITAL_COMMUNITY): Payer: Self-pay | Admitting: Internal Medicine

## 2024-01-01 DIAGNOSIS — I2690 Septic pulmonary embolism without acute cor pulmonale: Secondary | ICD-10-CM

## 2024-01-01 DIAGNOSIS — E119 Type 2 diabetes mellitus without complications: Secondary | ICD-10-CM

## 2024-01-01 DIAGNOSIS — R609 Edema, unspecified: Secondary | ICD-10-CM

## 2024-01-01 DIAGNOSIS — I76 Septic arterial embolism: Secondary | ICD-10-CM

## 2024-01-01 LAB — BASIC METABOLIC PANEL WITH GFR
Anion gap: 11 (ref 5–15)
BUN: 10 mg/dL (ref 6–20)
CO2: 26 mmol/L (ref 22–32)
Calcium: 8.9 mg/dL (ref 8.9–10.3)
Chloride: 100 mmol/L (ref 98–111)
Creatinine, Ser: 1.05 mg/dL (ref 0.61–1.24)
GFR, Estimated: >60 mL/min (ref >60)
Glucose, Bld: 105 mg/dL — ABNORMAL HIGH (ref 70–99)
Potassium: 3.2 mmol/L — ABNORMAL LOW (ref 3.5–5.1)
Sodium: 137 mmol/L (ref 135–145)

## 2024-01-01 LAB — CBC
HCT: 43.8 % (ref 39.0–52.0)
Hemoglobin: 14.6 g/dL (ref 13.0–17.0)
MCH: 28.9 pg (ref 26.0–34.0)
MCHC: 33.3 g/dL (ref 30.0–36.0)
MCV: 86.7 fL (ref 80.0–100.0)
Platelets: 198 K/uL (ref 150–400)
RBC: 5.05 MIL/uL (ref 4.22–5.81)
RDW: 13.7 % (ref 11.5–15.5)
WBC: 15.8 K/uL — ABNORMAL HIGH (ref 4.0–10.5)
nRBC: 0 % (ref 0.0–0.2)

## 2024-01-01 LAB — MAGNESIUM: Magnesium: 2 mg/dL (ref 1.7–2.4)

## 2024-01-01 LAB — GLUCOSE, CAPILLARY
Glucose-Capillary: 116 mg/dL — ABNORMAL HIGH (ref 70–99)
Glucose-Capillary: 147 mg/dL — ABNORMAL HIGH (ref 70–99)

## 2024-01-01 LAB — ECHOCARDIOGRAM COMPLETE
Area-P 1/2: 3.49 cm2
Height: 76 in
S' Lateral: 3.6 cm
Weight: 5996.51 [oz_av]

## 2024-01-01 LAB — HEPATIC FUNCTION PANEL
ALT: 18 U/L (ref 0–44)
AST: 23 U/L (ref 15–41)
Albumin: 3.8 g/dL (ref 3.5–5.0)
Alkaline Phosphatase: 88 U/L (ref 38–126)
Bilirubin, Direct: 0.3 mg/dL — ABNORMAL HIGH (ref 0.0–0.2)
Indirect Bilirubin: 0.4 mg/dL (ref 0.3–0.9)
Total Bilirubin: 0.7 mg/dL (ref 0.0–1.2)
Total Protein: 7.4 g/dL (ref 6.5–8.1)

## 2024-01-01 LAB — CBG MONITORING, ED: Glucose-Capillary: 141 mg/dL — ABNORMAL HIGH (ref 70–99)

## 2024-01-01 MED ORDER — BENZONATATE 100 MG PO CAPS: 200.0000 mg | ORAL_CAPSULE | Freq: Three times a day (TID) | ORAL | Status: DC | PRN

## 2024-01-01 MED ORDER — VANCOMYCIN HCL 2000 MG/400ML IV SOLN
2000.0000 mg | Freq: Two times a day (BID) | INTRAVENOUS | Status: DC
  Administered 2024-01-01 – 2024-01-02 (×4): 2000 mg via INTRAVENOUS
  Filled 2024-01-01 (×5): qty 400

## 2024-01-01 MED ORDER — RIVAROXABAN 10 MG PO TABS: 10.0000 mg | ORAL_TABLET | Freq: Every day | ORAL | Status: DC

## 2024-01-01 MED ORDER — TRAMADOL HCL 50 MG PO TABS
50.0000 mg | ORAL_TABLET | Freq: Four times a day (QID) | ORAL | Status: AC | PRN
Start: 2024-01-01 — End: 2024-01-01
  Administered 2024-01-01: 50 mg via ORAL

## 2024-01-01 MED ORDER — ROSUVASTATIN CALCIUM 10 MG PO TABS
20.0000 mg | ORAL_TABLET | Freq: Every day | ORAL | Status: DC
  Administered 2024-01-01 – 2024-01-02 (×2): 20 mg via ORAL
  Filled 2024-01-01: qty 2
  Filled 2024-01-01: qty 1

## 2024-01-01 MED ORDER — HEPARIN (PORCINE) 25000 UT/250ML-% IV SOLN
3300.0000 [IU]/h | INTRAVENOUS | Status: DC
  Administered 2024-01-01: 2100 [IU]/h via INTRAVENOUS
  Administered 2024-01-02: 3000 [IU]/h via INTRAVENOUS
  Administered 2024-01-02: 2500 [IU]/h via INTRAVENOUS
  Administered 2024-01-03 (×2): 3300 [IU]/h via INTRAVENOUS
  Filled 2024-01-01 (×5): qty 250

## 2024-01-01 MED ORDER — POTASSIUM CHLORIDE CRYS ER 20 MEQ PO TBCR
40.0000 meq | EXTENDED_RELEASE_TABLET | Freq: Once | ORAL | Status: AC
  Administered 2024-01-01: 40 meq via ORAL
  Filled 2024-01-01: qty 2

## 2024-01-01 MED ORDER — IPRATROPIUM-ALBUTEROL 0.5-2.5 (3) MG/3ML IN SOLN: 3.0000 mL | Freq: Four times a day (QID) | RESPIRATORY_TRACT | Status: DC

## 2024-01-01 MED ORDER — IPRATROPIUM-ALBUTEROL 0.5-2.5 (3) MG/3ML IN SOLN: 3.0000 mL | Freq: Four times a day (QID) | RESPIRATORY_TRACT | Status: DC | PRN

## 2024-01-01 MED ORDER — POLYETHYLENE GLYCOL 3350 17 G PO PACK
17.0000 g | PACK | Freq: Two times a day (BID) | ORAL | Status: DC
  Filled 2024-01-01 (×3): qty 1

## 2024-01-01 MED ORDER — TRAMADOL HCL 50 MG PO TABS
ORAL_TABLET | ORAL | Status: AC
  Filled 2024-01-01: qty 1

## 2024-01-01 MED ORDER — SENNOSIDES-DOCUSATE SODIUM 8.6-50 MG PO TABS
1.0000 | ORAL_TABLET | Freq: Two times a day (BID) | ORAL | Status: DC
  Administered 2024-01-01 – 2024-01-02 (×3): 1 via ORAL
  Filled 2024-01-01 (×3): qty 1

## 2024-01-01 MED ORDER — PERFLUTREN LIPID MICROSPHERE
1.0000 mL | INTRAVENOUS | Status: AC | PRN
  Administered 2024-01-01: 2 mL via INTRAVENOUS

## 2024-01-01 MED ORDER — PIPERACILLIN-TAZOBACTAM 3.375 G IVPB
3.3750 g | Freq: Three times a day (TID) | INTRAVENOUS | Status: DC
  Administered 2024-01-01 – 2024-01-03 (×6): 3.375 g via INTRAVENOUS
  Filled 2024-01-01 (×6): qty 50

## 2024-01-01 MED ORDER — INSULIN ASPART 100 UNIT/ML IJ SOLN
0.0000 [IU] | Freq: Three times a day (TID) | INTRAMUSCULAR | Status: DC
  Administered 2024-01-01 – 2024-01-02 (×2): 1 [IU] via SUBCUTANEOUS
  Administered 2024-01-02: 2 [IU] via SUBCUTANEOUS
  Filled 2024-01-01 (×2): qty 1
  Filled 2024-01-01: qty 2

## 2024-01-01 MED ORDER — TRAMADOL HCL 50 MG PO TABS
50.0000 mg | ORAL_TABLET | Freq: Once | ORAL | Status: AC
  Administered 2024-01-01: 50 mg via ORAL
  Filled 2024-01-01: qty 1

## 2024-01-01 MED ORDER — IOHEXOL 300 MG/ML  SOLN
100.0000 mL | Freq: Once | INTRAMUSCULAR | Status: AC | PRN
  Administered 2024-01-01: 100 mL via INTRAVENOUS

## 2024-01-01 MED ORDER — AMLODIPINE BESYLATE 5 MG PO TABS
5.0000 mg | ORAL_TABLET | Freq: Every day | ORAL | Status: DC
  Administered 2024-01-01 – 2024-01-02 (×2): 5 mg via ORAL
  Filled 2024-01-01 (×2): qty 1

## 2024-01-01 NOTE — Progress Notes (Addendum)
 PHARMACY - ANTICOAGULATION CONSULT NOTE  Pharmacy Consult for heparin  Indication: DVT   Allergies  Allergen Reactions   Tea Anaphylaxis   Porcine (Pork) Protein-Containing Drug Products     Patient said he does not eat pork    Patient Measurements: Height: 6' 4 (193 cm) Weight: (!) 170 kg (374 lb 12.5 oz) IBW/kg (Calculated) : 86.8 HEPARIN DW (KG): 126.9  Vital Signs: Temp: 98.1 F (36.7 C) (11/18 1559) Temp Source: Oral (11/18 1559) BP: 175/103 (11/18 1559) Pulse Rate: 81 (11/18 1559)  Labs: Recent Labs    12/31/23 1818 01/01/24 0600  HGB 16.3 14.6  HCT 49.1 43.8  PLT 198 198  CREATININE 1.06 1.05    Estimated Creatinine Clearance: 150.9 mL/min (by C-G formula based on SCr of 1.05 mg/dL).   Medical History: Past Medical History:  Diagnosis Date   Arthritis    Diabetes mellitus without complication (HCC)    Type 2   Hypercholesteremia    Hypertension    Pneumonia    Dec. 2023   Sleep apnea    Stroke (HCC) 2012   Bell's Palsy Deficit. Now resolved   TIA (transient ischemic attack) 2012    Medications:  Not on pta anticoagulation   Assessment: Patient is a 56 yoM presented with CP with dyspnea. CT angio showing no PE; other findings concerning for pulmonary septic emboli. LE doppler showing acute left DVT. Pharmacy consulted for heparin management for acute DVT. No bolus per MD due to hemoptysis episode 11/18.  -Of note, patient does not consume pork for religious reasons but is fine with heparin management after discussion with MD.    Today, 01/01/24 - Baseline CBC WNL   Goal of Therapy:  Heparin level 0.3-0.7 units/ml Monitor platelets by anticoagulation protocol: Yes   Plan:  Defer heparin bolus  Start heparin infusion at 2100 units/hr Check heparin level in 6hr after initiation  Continue to monitor CBC and s/sx of bleeding  Follow up long term anticoagulation plan  Brayden Betters 01/01/2024,5:50 PM

## 2024-01-01 NOTE — Progress Notes (Signed)
 Pharmacy Antibiotic Note  [name] [name] is a 43 y.o. male admitted on 12/31/2023 with septic emboli.  Pharmacy has been consulted for Vancomycin  and Zosyn dosing.  Plan: Zosyn 3.375g IV q8h (4 hour infusion). Vancomycin  2000 mg IV Q 12 hrs. Goal AUC 400-550.  Expected AUC: 500.2  SCr used: 1.06 Daily SCr while on both Vancomycin  and Zosyn F/u culture results & sensitivities  Height: 6' 4 (193 cm) Weight: (!) 170 kg (374 lb 12.5 oz) IBW/kg (Calculated) : 86.8  Temp (24hrs), Avg:98.6 F (37 C), Min:97.6 F (36.4 C), Max:99 F (37.2 C)  Recent Labs  Lab 12/31/23 1818  WBC 17.8*  CREATININE 1.06    Estimated Creatinine Clearance: 149.5 mL/min (by C-G formula based on SCr of 1.06 mg/dL).    Allergies  Allergen Reactions   Tea Anaphylaxis   Porcine (Pork) Protein-Containing Drug Products     Patient said he does not eat pork    Antimicrobials this admission: 11/17 Vancomycin  >>   11/17 Zosyn >>    Dose adjustments this admission:    Microbiology results: 11/17 BCx:      Thank you for allowing pharmacy to be a part of this patient's care.  Arvin Gauss, PharmD 01/01/2024 6:17 AM

## 2024-01-01 NOTE — ED Notes (Signed)
 Pt keeps taking on tele monitor and other vital monitoring devices. Pt educated on why we need them on.

## 2024-01-01 NOTE — Progress Notes (Signed)
 PROGRESS NOTE    [name] [name]  FMW:[id number] DOB: [birth year] DOA: 12/31/2023 PCP: Almarie Waddell NOVAK, NP   Brief Narrative: 43 year old with past medical history significant for hypertension, hyperlipidemia, diabetes type 2, class III obesity, tobacco use disorder presents complaining of chest pain with dyspnea.  He also report body aches since Saturday.  Evaluation in the ED chest x-ray concerning for developing patchy airspace opacity in the right lung.  CTA show concern for septic emboli but no PE.    Assessment & Plan:   Principal Problem:   Septic pulmonary embolism (HCC) Active Problems:   Hypertension   Type 2 diabetes mellitus with hyperglycemia, without long-term current use of insulin  (HCC)   1-Chest pain: Bilateral Septic emboli: -Patient presented with shortness, chest pain, myalgia.  Leukocytosis - CT a chest showed finding concerning for pulmonary septic emboli. - Follow blood cultures - Follow 2D echo - Continue IV antibiotics - ID consulted.  - Monitor on telemetry Report small amount of hemoptysis, monitor hold DVT prohylasix.  Will check lower extremity Doppler  Hypertension: - Resume Norvasc .  Hold for now hydrochlorothiazide  and lisinopril   Diabetes type 2: -On Trulicity  - Monitor CBG and start sliding scale insulin   Hyperlipidemia/TIA: -Continue statins   Hypokalemia Replete orally.      Estimated body mass index is 45.62 kg/m as calculated from the following:   Height as of this encounter: 6' 4 (1.93 m).   Weight as of this encounter: 170 kg.   DVT prophylaxis: Hold DVT prophylaxis due to hemoptysis Code Status: Full code Family Communication: Discussed with patient and family at bedside Disposition Plan:  Status is: Inpatient Remains inpatient appropriate because: Management of bilateral septic emboli    Consultants:  ID  Procedures:  Echo  Antimicrobials:    Subjective: He report chest pain, started to cough  with small amount of blood.  He reports chronic left lower extremity edema since he had surgery in that leg.  Objective: Vitals:   12/31/23 2300 01/01/24 0030 01/01/24 0315 01/01/24 0320  BP: (!) 159/97 (!) 220/145 (!) 149/94 (!) 149/79  Pulse: 85 98  (!) 101  Resp: 16   18  Temp:    97.6 F (36.4 C)  TempSrc:      SpO2: 97% 96%  93%  Weight:      Height:        Intake/Output Summary (Last 24 hours) at 01/01/2024 0717 Last data filed at 01/01/2024 0038 Gross per 24 hour  Intake 450.51 ml  Output --  Net 450.51 ml   Filed Weights   12/31/23 2145  Weight: (!) 170 kg    Examination:  General exam: Appears calm and comfortable  Respiratory system: Clear to auscultation. Respiratory effort normal. Cardiovascular system: S1 & S2 heard, RRR. No JVD, murmurs, rubs, gallops or clicks. No pedal edema. Gastrointestinal system: Abdomen is nondistended, soft and nontender. No organomegaly or masses felt. Normal bowel sounds heard. Central nervous system: Alert and oriented. No focal neurological deficits. Extremities: Symmetric 5 x 5 power. Chronic deformity muscle Left Leg   Data Reviewed: I have personally reviewed following labs and imaging studies  CBC: Recent Labs  Lab 12/31/23 1818 01/01/24 0600  WBC 17.8* 15.8*  HGB 16.3 14.6  HCT 49.1 43.8  MCV 86.7 86.7  PLT 198 198   Basic Metabolic Panel: Recent Labs  Lab 12/31/23 1818 12/31/23 2210 01/01/24 0600  NA 136  --  137  K 3.6  --  3.2*  CL 99  --  100  CO2 26  --  26  GLUCOSE 112*  --  105*  BUN 8  --  10  CREATININE 1.06  --  1.05  CALCIUM  9.5  --  8.9  MG  --  2.0  --    GFR: Estimated Creatinine Clearance: 150.9 mL/min (by C-G formula based on SCr of 1.05 mg/dL). Liver Function Tests: Recent Labs  Lab 12/31/23 2210  AST 23  ALT 18  ALKPHOS 88  BILITOT 0.7  PROT 7.4  ALBUMIN 3.8   No results for input(s): LIPASE, AMYLASE in the last 168 hours. No results for input(s): AMMONIA  in the  last 168 hours. Coagulation Profile: No results for input(s): INR, PROTIME in the last 168 hours. Cardiac Enzymes: No results for input(s): CKTOTAL, CKMB, CKMBINDEX, TROPONINI in the last 168 hours. BNP (last 3 results) No results for input(s): PROBNP in the last 8760 hours. HbA1C: No results for input(s): HGBA1C in the last 72 hours. CBG: No results for input(s): GLUCAP in the last 168 hours. Lipid Profile: No results for input(s): CHOL, HDL, LDLCALC, TRIG, CHOLHDL, LDLDIRECT in the last 72 hours. Thyroid Function Tests: No results for input(s): TSH, T4TOTAL, FREET4, T3FREE, THYROIDAB in the last 72 hours. Anemia Panel: No results for input(s): VITAMINB12, FOLATE, FERRITIN, TIBC, IRON, RETICCTPCT in the last 72 hours. Sepsis Labs: No results for input(s): PROCALCITON, LATICACIDVEN in the last 168 hours.  No results found for this or any previous visit (from the past 240 hours).       Radiology Studies: CT ABDOMEN PELVIS W CONTRAST Result Date: 01/01/2024 EXAM: CT ABDOMEN AND PELVIS WITH CONTRAST 01/01/2024 01:11:57 AM TECHNIQUE: CT of the abdomen and pelvis was performed with the administration of intravenous contrast. 100 mL of iohexol  (OMNIPAQUE ) 300 MG/ML solution was administered. Multiplanar reformatted images are provided for review. Automated exposure control, iterative reconstruction, and/or weight-based adjustment of the mA/kV was utilized to reduce the radiation dose to as low as reasonably achievable. COMPARISON: None available. CLINICAL HISTORY: Sepsis. FINDINGS: LOWER CHEST: Bilateral lower lobes demonstrate patchy peribronchovascular nodular-like centrally consolidative and peripherally ground-glass opacities. Please see separately dictated CT angiography chest 12/31/2023. LIVER: The liver is unremarkable. GALLBLADDER AND BILE DUCTS: Contracted gallbladder. No biliary ductal dilatation. SPLEEN: No acute abnormality.  PANCREAS: No acute abnormality. ADRENAL GLANDS: No acute abnormality. KIDNEYS, URETERS AND BLADDER: No stones in the kidneys or ureters. No hydronephrosis. No perinephric or periureteral stranding. Urinary bladder is unremarkable. GI AND BOWEL: No small bowel or large bowel wall thickening or dilatation. The appendix is normal. Stomach demonstrates no acute abnormality. There is no bowel obstruction. PERITONEUM AND RETROPERITONEUM: No ascites. No free air. VASCULATURE: Atherosclerotic plaque. Aorta is normal in caliber. LYMPH NODES: No lymphadenopathy. REPRODUCTIVE ORGANS: Prostate unremarkable. BONES AND SOFT TISSUES: No acute osseous abnormality. No focal soft tissue abnormality. IMPRESSION: 1. Bilateral lower lobe peribronchovascular nodular consolidative and peripheral ground-glass opacities, refer to separately dictated CT angiography chest 12/31/2023 for full thoracic assessment. 2. No acute intraabdominal or intrapelvic findings. Electronically signed by: Morgane Naveau MD 01/01/2024 01:23 AM EST RP Workstation: [id number]   CT Angio Chest PE W and/or Wo Contrast Result Date: 12/31/2023 EXAM: CTA CHEST 12/31/2023 08:23:04 PM TECHNIQUE: CTA of the chest was performed after the administration of 75 mL of iohexol  (OMNIPAQUE ) 350 MG/ML injection. Multiplanar reformatted images are provided for review. MIP images are provided for review. Automated exposure control, iterative reconstruction, and/or weight based adjustment of the mA/kV was utilized to reduce the radiation dose to as low  as reasonably achievable. COMPARISON: None available. CLINICAL HISTORY: pain when taking a deep breath, shortness of breath FINDINGS: PULMONARY ARTERIES: Pulmonary arteries are adequately opacified for evaluation. Limited evaluation of the subsegmental level for pulmonary embolus due to motion artifact. Main pulmonary artery is normal in caliber. MEDIASTINUM: The heart and pericardium demonstrate no acute abnormality. There is no  acute abnormality of the thoracic aorta. LYMPH NODES: Enlarged right hilar and mediastinal lymphadenopathy, difficult to measure. No left hilar or axillary lymphadenopathy. LUNGS AND PLEURA: Bilateral patchy peribronchovascular centrally consolidative and peripherally ground glass airspace opacities. No cavitation. No evidence of pleural effusion or pneumothorax. UPPER ABDOMEN: Limited images of the upper abdomen are unremarkable. SOFT TISSUES AND BONES: No acute bone or soft tissue abnormality. IMPRESSION: 1. No pulmonary embolism identified; limited evaluation of the subsegmental level due to motion artifact. 2. Bilateral patchy peribronchovascular consolidative and peripheral ground glass airspace opacities without cavitation. Findings concerning for septic emboli. 3. Enlarged right hilar and mediastinal lymphadenopathy, difficult to measure. Findings likely reactive in etiology. Electronically signed by: Morgane Naveau MD 12/31/2023 09:24 PM EST RP Workstation: [id number]   DG Chest 2 View Result Date: 12/31/2023 EXAM: 2 VIEW(S) X-RAY OF THE CHEST 12/31/2023 06:12:00 PM COMPARISON: 02/21/2023 CLINICAL HISTORY: SOB FINDINGS: LUNGS AND PLEURA: Patchy space opacity of the right lung.  No pleural effusion. No pneumothorax. HEART AND MEDIASTINUM: No acute abnormality of the cardiac and mediastinal silhouettes. BONES AND SOFT TISSUES: No acute osseous abnormality. IMPRESSION: 1. Suspected developing patchy airspace opacity in the right lung, which may represent infection; recommend follow-up PA and lateral chest radiograph in 34 weeks to ensure resolution. Electronically signed by: Morgane Naveau MD 12/31/2023 07:31 PM EST RP Workstation: [id number]        Scheduled Meds:  sodium chloride  flush  3 mL Intravenous Q12H   Continuous Infusions:  piperacillin-tazobactam (ZOSYN)  IV 3.375 g (01/01/24 0647)   vancomycin        LOS: 1 day    Time spent: 35 Minutes    Tawania Daponte A Jatia Musa, MD [facility]   If 7PM-7AM, please contact night-coverage [email or web address]  01/01/2024, 7:17 AM

## 2024-01-01 NOTE — ED Notes (Signed)
 Pt was informed multiple times by multiple personnel in ED to not leave to go to [place]. Pt was not in room upon inspection. Left with IV still in arm

## 2024-01-01 NOTE — Progress Notes (Signed)
  Echocardiogram 2D Echocardiogram has been performed.  Charmaine KANDICE Gaskins 01/01/2024, 11:31 AM

## 2024-01-01 NOTE — Plan of Care (Signed)
  Problem: Coping: Goal: Ability to adjust to condition or change in health will improve Outcome: Progressing   Problem: Fluid Volume: Goal: Ability to maintain a balanced intake and output will improve Outcome: Progressing   Problem: Metabolic: Goal: Ability to maintain appropriate glucose levels will improve Outcome: Progressing   Problem: Skin Integrity: Goal: Risk for impaired skin integrity will decrease Outcome: Progressing   Problem: Clinical Measurements: Goal: Respiratory complications will improve Outcome: Progressing Goal: Cardiovascular complication will be avoided Outcome: Progressing   Problem: Activity: Goal: Risk for activity intolerance will decrease Outcome: Progressing   Problem: Nutrition: Goal: Adequate nutrition will be maintained Outcome: Progressing   Problem: Pain Managment: Goal: General experience of comfort will improve and/or be controlled Outcome: Progressing   Problem: Safety: Goal: Ability to remain free from injury will improve Outcome: Progressing

## 2024-01-02 DIAGNOSIS — I82409 Acute embolism and thrombosis of unspecified deep veins of unspecified lower extremity: Secondary | ICD-10-CM | POA: Diagnosis present

## 2024-01-02 LAB — HEPARIN LEVEL (UNFRACTIONATED)
Heparin Unfractionated: <0.1 [IU]/mL — ABNORMAL LOW (ref 0.30–0.70)
Heparin Unfractionated: <0.1 [IU]/mL — ABNORMAL LOW (ref 0.30–0.70)
Heparin Unfractionated: 0.23 [IU]/mL — ABNORMAL LOW (ref 0.30–0.70)

## 2024-01-02 LAB — HEPATITIS PANEL, ACUTE
HCV Ab: NONREACTIVE
Hep A IgM: NONREACTIVE
Hep B C IgM: NONREACTIVE
Hepatitis B Surface Ag: NONREACTIVE

## 2024-01-02 LAB — BASIC METABOLIC PANEL WITH GFR
Anion gap: 12 (ref 5–15)
BUN: 11 mg/dL (ref 6–20)
CO2: 22 mmol/L (ref 22–32)
Calcium: 8.8 mg/dL — ABNORMAL LOW (ref 8.9–10.3)
Chloride: 102 mmol/L (ref 98–111)
Creatinine, Ser: 0.94 mg/dL (ref 0.61–1.24)
GFR, Estimated: >60 mL/min (ref >60)
Glucose, Bld: 123 mg/dL — ABNORMAL HIGH (ref 70–99)
Potassium: 3.7 mmol/L (ref 3.5–5.1)
Sodium: 135 mmol/L (ref 135–145)

## 2024-01-02 LAB — GLUCOSE, CAPILLARY
Glucose-Capillary: 120 mg/dL — ABNORMAL HIGH (ref 70–99)
Glucose-Capillary: 166 mg/dL — ABNORMAL HIGH (ref 70–99)
Glucose-Capillary: 149 mg/dL — ABNORMAL HIGH (ref 70–99)
Glucose-Capillary: 100 mg/dL — ABNORMAL HIGH (ref 70–99)

## 2024-01-02 LAB — CBC
HCT: 45.1 % (ref 39.0–52.0)
Hemoglobin: 14.8 g/dL (ref 13.0–17.0)
MCH: 29.2 pg (ref 26.0–34.0)
MCHC: 32.8 g/dL (ref 30.0–36.0)
MCV: 89 fL (ref 80.0–100.0)
Platelets: 196 K/uL (ref 150–400)
RBC: 5.07 MIL/uL (ref 4.22–5.81)
RDW: 13.7 % (ref 11.5–15.5)
WBC: 10.5 K/uL (ref 4.0–10.5)
nRBC: 0 % (ref 0.0–0.2)

## 2024-01-02 LAB — HIV ANTIBODY (ROUTINE TESTING W REFLEX): HIV Screen 4th Generation wRfx: NONREACTIVE

## 2024-01-02 MED ORDER — MELATONIN 5 MG PO TABS
5.0000 mg | ORAL_TABLET | Freq: Once | ORAL | Status: AC
Start: 2024-01-02 — End: 2024-01-02
  Administered 2024-01-02: 5 mg via ORAL
  Filled 2024-01-02: qty 1

## 2024-01-02 MED ORDER — HEPARIN BOLUS VIA INFUSION
3000.0000 [IU] | Freq: Once | INTRAVENOUS | Status: AC
  Administered 2024-01-02: 3000 [IU] via INTRAVENOUS
  Filled 2024-01-02: qty 3000

## 2024-01-02 MED ORDER — NICOTINE 14 MG/24HR TD PT24: 14.0000 mg | MEDICATED_PATCH | Freq: Every day | TRANSDERMAL | Status: DC | PRN

## 2024-01-02 NOTE — Assessment & Plan Note (Signed)
 Last A1c was 6.4, good control Hold Trulicity  Cover with moderate-scale SSI Carb modified diet

## 2024-01-02 NOTE — TOC Initial Note (Signed)
 Transition of Care [facility]) - Initial/Assessment Note    Patient Details  Name: [name] [name] MRN: [id number] Date of Birth: 11-08-1980  Transition of [facility]) CM/SW Contact:    Alfonse JONELLE Rex, RN Phone Number: 01/02/2024, 3:08 PM  Clinical Narrative:    patient admitted from home with c/o chest pain and shortness of breath, resides in a private residence, has family contact on file, has PCP on file, employer listed, no insurance on file. INPT CM will follow for dc needs, anticipate dc home to self care.                Expected Discharge Plan: Home/Self Care Barriers to Discharge: Continued Medical Work up   Patient Goals and CMS Choice Patient states their goals for this hospitalization and ongoing recovery are:: resides in a private residence          Expected Discharge Plan and Services       Living arrangements for the past 2 months: Single Family Home                                      Prior Living Arrangements/Services Living arrangements for the past 2 months: Single Family Home Lives with:: Self Patient language and need for interpreter reviewed:: Yes        Need for Family Participation in Patient Care: Yes (Comment) Care giver support system in place?: Yes (comment)   Criminal Activity/Legal Involvement Pertinent to Current Situation/Hospitalization: No - Comment as needed  Activities of Daily Living      Permission Sought/Granted                  Emotional Assessment       Orientation: : Oriented to Self, Oriented to  Time, Oriented to Place, Oriented to Situation Alcohol / Substance Use: Not Applicable Psych Involvement: No (comment)  Admission diagnosis:  Shortness of breath [R06.02] Septic pulmonary embolism (HCC) [I26.90] Septic embolism (HCC) [I76] Chest pain, unspecified type [R07.9] Patient Active Problem List   Diagnosis Date Noted   Acute DVT (deep venous thrombosis) (HCC) 01/02/2024   Septic pulmonary embolism  (HCC) 12/31/2023   Severe major depression, single episode, without psychotic features (HCC) 05/22/2023   Suicide attempt (HCC) 05/21/2023   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   Suicide attempt by multiple drug overdose (HCC) 05/20/2023   Hypertriglyceridemia 05/17/2022   Type 2 diabetes mellitus with hyperglycemia, without long-term current use of insulin  (HCC) 02/16/2022   Tobacco use 04/16/2018   Obesity, Class III, BMI 40-49.9 (morbid obesity) (HCC) 04/16/2018   Hypertension 06/02/2015   PCP:  Almarie Waddell NOVAK, NP Pharmacy:   [id number] [place], [place] - [address] AT [facility] [address] [place] [address] Phone: [phone] Fax: [phone]  [facility] - [place], [place] - [address] [place] [address] Phone: [phone] Fax: [phone]     Social Drivers of Health (SDOH) Social History: SDOH Screenings   Food Insecurity: No Food Insecurity (01/02/2024)  Housing: Low Risk  (01/02/2024)  Transportation Needs: No Transportation Needs (01/02/2024)  Utilities: Not At Risk (01/02/2024)  Alcohol Screen: Low Risk  (05/21/2023)  Depression (PHQ2-9): Low Risk  (04/16/2018)  Tobacco Use: High Risk (12/31/2023)   SDOH Interventions:     Readmission Risk Interventions  01/02/2024    3:03 PM  Readmission Risk Prevention Plan  Post Dischage Appt Complete  Medication Screening Complete  Transportation Screening Complete

## 2024-01-02 NOTE — Assessment & Plan Note (Addendum)
-  Encourage cessation.   -Patch ordered  

## 2024-01-02 NOTE — Progress Notes (Signed)
 Progress Note   Patient: [name] [name] FMW:[id number] DOB: [birth year] DOA: 12/31/2023     2 DOS: the patient was seen and examined on 01/02/2024   Brief hospital course: 43yo with h/o HTN, HLD, T2DM, morbid obesity, and tobacco use d/o who presented on 11/17 with chest pain with dyspnea.  CXR with RLL opacity; CTA with septic emboli without PE.  DVT US  + for DVT.  Patient with hemoptysis so he was started on heparin.    Assessment & Plan Septic pulmonary embolism [facility]) Patient presented with shortness, chest pain, myalgias; noted to have leukocytosis CT chest showed findings concerning for pulmonary septic emboli Blood cultures NTD Echo without apparent vegetation; needs TEE Continue IV antibiotics (vancomycin , Zosyn -> Ceftriaxone per ID) ID consulted Report small amount of hemoptysis Acute DVT (deep venous thrombosis) (HCC) No PE on CTA but DVT US  positive for acute DVT of L peroneal veins Started on heparin given presenting c/o hemoptysis Hypertension Resume Norvasc  Hold for now hydrochlorothiazide  and lisinopril   Type 2 diabetes mellitus with hyperglycemia, without long-term current use of insulin  (HCC) Last A1c was 6.4, good control Hold Trulicity  Cover with moderate-scale SSI Carb modified diet  Tobacco use Encourage cessation Patch ordered Hypertriglyceridemia Continue rosuvastatin  Obesity, Class III, BMI 40-49.9 (morbid obesity) (HCC) Body mass index is 46.96 kg/m.SABRA  Weight loss should be encouraged Outpatient PCP/bariatric medicine f/u encouraged Significantly low or high BMI is associated with higher medical risk including morbidity and mortality       Consultants: None  Procedures: Echocardiogram 11/17 DVT US  11/17  Antibiotics: Zosyn 11/17- Vancomycin  11/17-  30 Day Unplanned Readmission Risk Score    Flowsheet Row ED to Hosp-Admission (Current) from 12/31/2023 in [facility]  30 Day Unplanned  Readmission Risk Score (%) 10.65 Filed at 01/02/2024 0801    This score is the patient's risk of an unplanned readmission within 30 days of being discharged (0 -100%). The score is based on dignosis, age, lab data, medications, orders, and past utilization.   Low:  0-14.9   Medium: 15-21.9   High: 22-29.9   Extreme: 30 and above           Subjective: Feeling ok, much better.  Wants to go home ASAP.   Objective: Vitals:   01/02/24 0630 01/02/24 1159  BP: (!) 140/83 129/75  Pulse:  73  Resp:  18  Temp:  98.6 F (37 C)  SpO2:      Intake/Output Summary (Last 24 hours) at 01/02/2024 1648 Last data filed at 01/02/2024 1235 Gross per 24 hour  Intake 1410.3 ml  Output 600 ml  Net 810.3 ml   Filed Weights   12/31/23 2145 01/02/24 0500  Weight: (!) 170 kg (!) 175 kg    Exam:  General:  Appears calm and comfortable and is in NAD, on RA Eyes:  normal lids, iris ENT:  grossly normal hearing, lips & tongue, mmm Cardiovascular:  RRR. No LE edema.  Respiratory:   CTA bilaterally with no wheezes/rales/rhonchi.  Normal respiratory effort. Abdomen:  soft, NT, ND Skin:  no rash or induration seen on limited exam Musculoskeletal:  grossly normal tone BUE/BLE, good ROM, no bony abnormality Psychiatric:  grossly normal mood and affect, speech fluent and appropriate, AOx3 Neurologic:  CN 2-12 grossly intact, moves all extremities in coordinated fashion  Data Reviewed: I have reviewed the patient's lab results since admission.  Pertinent labs for today include:   Glucose 123 Normal CBC  Family Communication: Girlfriend was present throughout     Code Status: Full Code   Disposition: Status is: Inpatient Remains inpatient appropriate because: ongoing management     Time spent: 50 minutes  Unresulted Labs (From admission, onward)     Start     Ordered   01/03/24 0500  CBC  Daily,   R      01/01/24 1825   01/02/24 2200  Heparin level (unfractionated)   Once-Timed,   TIMED        01/02/24 1357             Author: Delon Herald, MD 01/02/2024 4:48 PM  For on call review [email or web address].

## 2024-01-02 NOTE — Progress Notes (Signed)
 PHARMACY - ANTICOAGULATION CONSULT NOTE  Pharmacy Consult for heparin  Indication: DVT   Allergies  Allergen Reactions   Tea Anaphylaxis   Porcine (Pork) Protein-Containing Drug Products     Patient said he does not eat pork    Patient Measurements: Height: 6' 4 (193 cm) Weight: (!) 175 kg (385 lb 12.8 oz) IBW/kg (Calculated) : 86.8 HEPARIN DW (KG): 126.9  Vital Signs: Temp: 98 F (36.7 C) (11/19 0405) Temp Source: Oral (11/19 0405) BP: 140/83 (11/19 0630) Pulse Rate: 67 (11/19 0405)  Labs: Recent Labs    12/31/23 1818 01/01/24 0600 01/02/24 0038  HGB 16.3 14.6 14.8  HCT 49.1 43.8 45.1  PLT 198 198 196  HEPARINUNFRC  --   --  <0.10*  CREATININE 1.06 1.05 0.94    Estimated Creatinine Clearance: 171.4 mL/min (by C-G formula based on SCr of 0.94 mg/dL).   Medical History: Past Medical History:  Diagnosis Date   Arthritis    Diabetes mellitus without complication (HCC)    Type 2   Hypercholesteremia    Hypertension    Pneumonia    Dec. 2023   Sleep apnea    Stroke (HCC) 2012   Bell's Palsy Deficit. Now resolved   TIA (transient ischemic attack) 2012    Medications:  Not on pta anticoagulation   Assessment: Patient is a 70 yoM presented with CP with dyspnea. CT angio without PE, but concerning for pulmonary septic emboli. LE doppler shows acute left DVT. Pharmacy is consulted for heparin management for acute DVT. No bolus per MD due to hemoptysis episode 11/18.  -He is not on anticoagulation PTA. -Of note, patient does not consume pork for religious reasons but is fine with heparin management after discussion with MD.    Today, 01/01/24 Heparin level <0.1, sub-therapeutic on heparin 2500 units/hr CBC WNL No bleeding or infusion related concerns reported.  No further hemoptysis reported.   Goal of Therapy:  Heparin level 0.3-0.7 units/ml Monitor platelets by anticoagulation protocol: Yes   Plan:  Per Dr. Barbarann, ok to give heparin bolus  today. Give heparin 3000 units bolus IV x 1 Increase to heparin IV infusion at 3000 units/hr Heparin level 8 hours after rate change due to obesity Continue to monitor CBC and s/sx of bleeding  Follow up long term anticoagulation plan    Wanda Hasting PharmD, BCPS [facility] main pharmacy [phone] 01/02/2024 8:09 AM

## 2024-01-02 NOTE — Progress Notes (Addendum)
 [facility]  Date of Admission:  12/31/2023   Total days of inpatient antibiotics 2  Principal Problem:   Septic pulmonary embolism (HCC) Active Problems:   Hypertension   Tobacco use   Obesity, Class III, BMI 40-49.9 (morbid obesity) (HCC)   Type 2 diabetes mellitus with hyperglycemia, without long-term current use of insulin  (HCC)   Hypertriglyceridemia   Acute DVT (deep venous thrombosis) [facility])          Assessment: 43 year old male with diabetes, hypercholesterolemia intentional overdose admission suicide event 05/20/23 presented with shortness of breath chest pain found to have #Concern for septic emboli to lungs in the setting of left peroneal vein acute DVT - CT showed bilateral patchy bronchial vascular consolidative and peripheral ground glass opacities without cavitation confirmed septic emboli.  Patient afebrile admission, WBC 17K.  Blood cultures obtained.  Patient started on broad-spectrum antibiotics - He denies drug use, reviewed UDS from 05/16/2023 only positive for THC.  Past UDS positive for opiates noted to have Norco on list at that time. - Tattoo on chest about a month ago.  No issues with healing patient notes professional tattoo. #Diabetes mellitus, A1c 6.4 Recommendations:  -Continue Vanco -Transition pip-tazo to ceftriaxone  - Follow-up blood cultures - HIV nonreactive.  Acute hepatitis panel pending - Lower extremity ultrasound showed acute DVT in the left peroneal veins.  Given concern for septic emboli of lungs and DVT I suspect this may be a thrombotic process rather than a septic emboli.  Will get TEE. - Standard precautions Communicated plan with primary  Microbiology:   Antibiotics: Vancomycin  and pip-tazo 11/17-present   Cultures: Blood 11/17   SUBJECTIVE: Sitting in bed.  Reports feels much better. Interval: Afebrile overnight.  WBC 17K.  Review of Systems: Review of Systems  All other systems reviewed  and are negative.    Scheduled Meds:  amLODipine   5 mg Oral Daily   insulin  aspart  0-9 Units Subcutaneous TID WC   polyethylene glycol  17 g Oral BID   rosuvastatin   20 mg Oral Daily   senna-docusate  1 tablet Oral BID   sodium chloride  flush  3 mL Intravenous Q12H   Continuous Infusions:  heparin 3,000 Units/hr (01/02/24 1235)   piperacillin-tazobactam (ZOSYN)  IV 3.375 g (01/02/24 0741)   vancomycin  2,000 mg (01/02/24 1211)   PRN Meds:.acetaminophen  **OR** acetaminophen , benzonatate , ipratropium-albuterol , senna-docusate Allergies  Allergen Reactions   Tea Anaphylaxis   Porcine (Pork) Protein-Containing Drug Products     Patient said he does not eat pork    OBJECTIVE: Vitals:   01/02/24 0405 01/02/24 0500 01/02/24 0630 01/02/24 1159  BP: (!) 140/83  (!) 140/83 129/75  Pulse: 67   73  Resp: 20   18  Temp: 98 F (36.7 C)   98.6 F (37 C)  TempSrc: Oral     SpO2: 98%     Weight:  (!) 175 kg    Height:       Body mass index is 46.96 kg/m.  Physical Exam Constitutional:      General: He is not in acute distress.    Appearance: He is normal weight. He is not toxic-appearing.  HENT:     Head: Normocephalic and atraumatic.     Right Ear: External ear normal.     Left Ear: External ear normal.     Nose: No congestion or rhinorrhea.     Mouth/Throat:     Mouth: Mucous membranes  are moist.     Pharynx: Oropharynx is clear.  Eyes:     Extraocular Movements: Extraocular movements intact.     Conjunctiva/sclera: Conjunctivae normal.     Pupils: Pupils are equal, round, and reactive to light.  Cardiovascular:     Rate and Rhythm: Normal rate and regular rhythm.     Heart sounds: No murmur heard.    No friction rub. No gallop.  Pulmonary:     Effort: Pulmonary effort is normal.     Breath sounds: Normal breath sounds.  Abdominal:     General: Abdomen is flat. Bowel sounds are normal.     Palpations: Abdomen is soft.  Musculoskeletal:        General: No  swelling. Normal range of motion.     Cervical back: Normal range of motion and neck supple.  Skin:    General: Skin is warm and dry.  Neurological:     General: No focal deficit present.     Mental Status: He is oriented to person, place, and time.  Psychiatric:        Mood and Affect: Mood normal.       Lab Results Lab Results  Component Value Date   WBC 10.5 01/02/2024   HGB 14.8 01/02/2024   HCT 45.1 01/02/2024   MCV 89.0 01/02/2024   PLT 196 01/02/2024    Lab Results  Component Value Date   CREATININE 0.94 01/02/2024   BUN 11 01/02/2024   NA 135 01/02/2024   K 3.7 01/02/2024   CL 102 01/02/2024   CO2 22 01/02/2024    Lab Results  Component Value Date   ALT 18 12/31/2023   AST 23 12/31/2023   ALKPHOS 88 12/31/2023   BILITOT 0.7 12/31/2023        [name] Stank, MD [facility] 01/02/2024, 2:37 PM Evaluation of this patient requires complex antimicrobial therapy evaluation and counseling + isolation needs for disease transmission risk assessment and mitigation

## 2024-01-02 NOTE — Assessment & Plan Note (Addendum)
 Patient presented with shortness, chest pain, myalgias; noted to have leukocytosis CT chest showed findings concerning for pulmonary septic emboli Blood cultures NTD Echo without apparent vegetation; needs TEE Continue IV antibiotics (vancomycin , Zosyn -> Ceftriaxone per ID) ID consulted Report small amount of hemoptysis

## 2024-01-02 NOTE — Plan of Care (Signed)
  Problem: Education: Goal: Ability to describe self-care measures that may prevent or decrease complications (Diabetes Survival Skills Education) will improve Outcome: Progressing   Problem: Coping: Goal: Ability to adjust to condition or change in health will improve Outcome: Progressing   Problem: Metabolic: Goal: Ability to maintain appropriate glucose levels will improve Outcome: Progressing   Problem: Nutritional: Goal: Maintenance of adequate nutrition will improve Outcome: Progressing   Problem: Clinical Measurements: Goal: Respiratory complications will improve Outcome: Progressing Goal: Cardiovascular complication will be avoided Outcome: Progressing   Problem: Elimination: Goal: Will not experience complications related to bowel motility Outcome: Progressing   Problem: Safety: Goal: Ability to remain free from injury will improve Outcome: Progressing

## 2024-01-02 NOTE — Progress Notes (Signed)
 PHARMACY - ANTICOAGULATION CONSULT NOTE  Pharmacy Consult for heparin   Indication: DVT   Allergies  Allergen Reactions   Tea Anaphylaxis   Porcine (Pork) Protein-Containing Drug Products     Patient said he does not eat pork    Patient Measurements: Height: 6' 4 (193 cm) Weight: (!) 170 kg (374 lb 12.5 oz) IBW/kg (Calculated) : 86.8 HEPARIN  DW (KG): 126.9  Vital Signs: Temp: 98.1 F (36.7 C) (11/19 0005) Temp Source: Oral (11/19 0005) BP: 168/107 (11/19 0005) Pulse Rate: 73 (11/19 0005)  Labs: Recent Labs    12/31/23 1818 01/01/24 0600 01/02/24 0038  HGB 16.3 14.6 14.8  HCT 49.1 43.8 45.1  PLT 198 198 196  HEPARINUNFRC  --   --  <0.10*  CREATININE 1.06 1.05 0.94    Estimated Creatinine Clearance: 168.6 mL/min (by C-G formula based on SCr of 0.94 mg/dL).   Medical History: Past Medical History:  Diagnosis Date   Arthritis    Diabetes mellitus without complication (HCC)    Type 2   Hypercholesteremia    Hypertension    Pneumonia    Dec. 2023   Sleep apnea    Stroke (HCC) 2012   Bell's Palsy Deficit. Now resolved   TIA (transient ischemic attack) 2012    Medications:  Not on pta anticoagulation   Assessment: Patient is a 52 yoM presented with CP with dyspnea. CT angio showing no PE; other findings concerning for pulmonary septic emboli. LE doppler showing acute left DVT. Pharmacy consulted for heparin  management for acute DVT. No bolus per MD due to hemoptysis episode 11/18.  -He is not on anticoagulation PTA. -Of note, patient does not consume pork for religious reasons but is fine with heparin  management after discussion with MD.   -Baseline CBC WNL   Today, 01/01/24 Initial heparin  level <0.1- subtherapeutic on IV heparin  2100 units/hr CBC WNL No bleeding or infusion related concerns reported by RN  Goal of Therapy:  Heparin  level 0.3-0.7 units/ml Monitor platelets by anticoagulation protocol: Yes   Plan:  No heparin  bolus per MD Increase  heparin  infusion to 2500 units/hr Check heparin  level 8h after rate increase due to obesity Continue to monitor CBC and s/sx of bleeding  Follow up long term anticoagulation plan  Rosaline Millet PharmD, BCPS 01/02/2024,1:31 AM

## 2024-01-02 NOTE — Assessment & Plan Note (Addendum)
 Resume Norvasc  Hold for now hydrochlorothiazide  and lisinopril 

## 2024-01-02 NOTE — Progress Notes (Signed)
   [facility] has been requested to perform a transesophageal echocardiogram on [name] [name] for septic pulmonary emboli.     The patient does NOT have any absolute or relative contraindications to a Transesophageal Echocardiogram (TEE).  The patient has: History of Obstructive Sleep Apnea   After careful review of history and examination, the risks and benefits of transesophageal echocardiogram have been explained including risks of esophageal damage, perforation (1:10,000 risk), bleeding, pharyngeal hematoma as well as other potential complications associated with conscious sedation including aspiration, arrhythmia, respiratory failure and death. Alternatives to treatment were discussed, questions were answered. Patient is willing to proceed.   Bonney Morse Clause, PA-C  01/02/2024 11:54 AM

## 2024-01-02 NOTE — Assessment & Plan Note (Addendum)
 Continue rosuvastatin.

## 2024-01-02 NOTE — Assessment & Plan Note (Addendum)
 No PE on CTA but DVT US  positive for acute DVT of L peroneal veins Started on heparin given presenting c/o hemoptysis

## 2024-01-02 NOTE — Assessment & Plan Note (Addendum)
 Body mass index is 46.96 kg/m.SABRA  Weight loss should be encouraged Outpatient PCP/bariatric medicine f/u encouraged Significantly low or high BMI is associated with higher medical risk including morbidity and mortality

## 2024-01-02 NOTE — Progress Notes (Signed)
 PHARMACY - ANTICOAGULATION CONSULT NOTE  Pharmacy Consult for heparin  Indication: DVT   Allergies  Allergen Reactions   Tea Anaphylaxis   Porcine (Pork) Protein-Containing Drug Products     Patient said he does not eat pork    Patient Measurements: Height: 6' 4 (193 cm) Weight: (!) 175 kg (385 lb 12.8 oz) IBW/kg (Calculated) : 86.8 HEPARIN DW (KG): 126.9  Vital Signs: Temp: 99 F (37.2 C) (11/19 2101) BP: 144/87 (11/19 2101) Pulse Rate: 78 (11/19 2101)  Labs: Recent Labs    12/31/23 1818 01/01/24 0600 01/02/24 0038 01/02/24 1045 01/02/24 2213  HGB 16.3 14.6 14.8  --   --   HCT 49.1 43.8 45.1  --   --   PLT 198 198 196  --   --   HEPARINUNFRC  --   --  <0.10* <0.10* 0.23*  CREATININE 1.06 1.05 0.94  --   --     Estimated Creatinine Clearance: 171.4 mL/min (by C-G formula based on SCr of 0.94 mg/dL).   Medical History: Past Medical History:  Diagnosis Date   Arthritis    Diabetes mellitus without complication (HCC)    Type 2   Hypercholesteremia    Hypertension    Pneumonia    Dec. 2023   Sleep apnea    Stroke (HCC) 2012   Bell's Palsy Deficit. Now resolved   TIA (transient ischemic attack) 2012    Medications:  Not on pta anticoagulation   Assessment: Patient is a 3 yoM presented with CP with dyspnea. CT angio without PE, but concerning for pulmonary septic emboli. LE doppler shows acute left DVT. Pharmacy is consulted for heparin management for acute DVT. No bolus per MD due to hemoptysis episode 11/18.  -He is not on anticoagulation PTA. -Of note, patient does not consume pork for religious reasons but is fine with heparin management after discussion with MD.    Today, 01/01/24 Heparin level 0.23, sub-therapeutic but trending up after bolus and heparin rate increase to 3000 units/hr CBC WNL No bleeding or infusion related concerns reported.  No further hemoptysis reported.   Goal of Therapy:  Heparin level 0.3-0.7 units/ml Monitor platelets  by anticoagulation protocol: Yes   Plan:  Per Dr. Barbarann, ok to give heparin bolus today. Repeat heparin 3000 unit bolus IV x 1 Increase to heparin IV infusion to 3300 units/hr Heparin level 8 hours after rate change due to obesity Continue to monitor CBC and s/sx of bleeding  Follow up long term anticoagulation plan   Rosaline Millet, PharmD, BCPS 01/02/2024 11:12 PM

## 2024-01-02 NOTE — Consult Note (Signed)
 [facility]    Date of Admission:  12/31/2023   Total days of inpatient antibiotics 1        Reason for Consult: Septic emboli to lungs    Principal Problem:   Septic pulmonary embolism (HCC) Active Problems:   Hypertension   Type 2 diabetes mellitus with hyperglycemia, without long-term current use of insulin  [facility])   Assessment: 43-year-old male with diabetes, hypercholesterolemia intentional overdose admission suicide event 05/20/23 presented with shortness of breath chest pain found to have #Concern for septic emboli to lungs - CT showed bilateral patchy bronchial vascular consolidative and peripheral ground glass opacities without cavitation confirmed septic emboli.  Patient afebrile admission, WBC 17K.  Blood cultures obtained.  Patient started on broad-spectrum antibiotics - He denies drug use, reviewed UDS from 05/16/2023 only positive for THC.  Past UDS positive for opiates noted to have Norco on list at that time. #Diabetes mellitus, A1c 6.4 Recommendations:  -Continue Vanco, pip-tazo today.  Transition pip-tazo to ceftriaxone tomorrow - Follow-up blood cultures - TTE, will need TEE.  - Hepatitis panel, HIV notes type II that was professionally done about a month ago, no concern for superimposed infection at that time - Standard precautions Microbiology:   Antibiotics: Vancomycin  and pip-tazo 11/17-present  Cultures: Blood 11/17 Urine  Other   HPI: [name] [name] is a 43 y.o. male diabetes melitis, hypertension, hypercholesterolemia, OSA, CVA, intentional drug overdose admission with suicide attempt on 05/20/23 presented with dyspnea and chest pain.  He reports that he started having bodyaches Saturday along with shortness of breath.On arrival patient afebrile, WBC 17K.  Started on vancomycin  and pip-tazo.  CT chest showed bilateral patchy bronchial vascular consolidative and peripheral ground glass opacities without cavitation  confirmed septic emboli.  CT abdomen pelvis showed same findings as CT chest   Review of Systems: Review of Systems  All other systems reviewed and are negative.   Past Medical History:  Diagnosis Date   Arthritis    Diabetes mellitus without complication (HCC)    Type 2   Hypercholesteremia    Hypertension    Pneumonia    Dec. 2023   Sleep apnea    Stroke (HCC) 2012   Bell's Palsy Deficit. Now resolved   TIA (transient ischemic attack) 2012    Social History   Tobacco Use   Smoking status: Some Days    Types: Cigars   Smokeless tobacco: Never  Vaping Use   Vaping status: Never Used  Substance Use Topics   Alcohol use: Yes    Comment: occasionally   Drug use: No    Comment: UDS positive histocially    Family History  Problem Relation Age of Onset   Diabetes Mother    Healthy Father    Scheduled Meds:  amLODipine   5 mg Oral Daily   insulin  aspart  0-9 Units Subcutaneous TID WC   polyethylene glycol  17 g Oral BID   rosuvastatin   20 mg Oral Daily   senna-docusate  1 tablet Oral BID   sodium chloride  flush  3 mL Intravenous Q12H   Continuous Infusions:  heparin 2,500 Units/hr (01/02/24 0605)   piperacillin-tazobactam (ZOSYN)  IV 3.375 g (01/02/24 0741)   vancomycin  2,000 mg (01/01/24 2323)   PRN Meds:.acetaminophen  **OR** acetaminophen , benzonatate , ipratropium-albuterol , senna-docusate Allergies  Allergen Reactions   Tea Anaphylaxis   Porcine (Pork) Protein-Containing Drug Products     Patient said he does not eat pork  OBJECTIVE: Blood pressure (!) 140/83, pulse 67, temperature 98 F (36.7 C), temperature source Oral, resp. rate 20, height 6' 4 (1.93 m), weight (!) 175 kg, SpO2 98%.  Physical Exam Constitutional:      General: He is not in acute distress.    Appearance: He is normal weight. He is not toxic-appearing.  HENT:     Head: Normocephalic and atraumatic.     Right Ear: External ear normal.     Left Ear: External ear normal.      Nose: No congestion or rhinorrhea.     Mouth/Throat:     Mouth: Mucous membranes are moist.     Pharynx: Oropharynx is clear.  Eyes:     Extraocular Movements: Extraocular movements intact.     Conjunctiva/sclera: Conjunctivae normal.     Pupils: Pupils are equal, round, and reactive to light.  Cardiovascular:     Rate and Rhythm: Normal rate and regular rhythm.     Heart sounds: No murmur heard.    No friction rub. No gallop.  Pulmonary:     Effort: Pulmonary effort is normal.     Breath sounds: Normal breath sounds.  Abdominal:     General: Abdomen is flat. Bowel sounds are normal.     Palpations: Abdomen is soft.  Musculoskeletal:        General: No swelling. Normal range of motion.     Cervical back: Normal range of motion and neck supple.  Skin:    General: Skin is warm and dry.  Neurological:     General: No focal deficit present.     Mental Status: He is oriented to person, place, and time.  Psychiatric:        Mood and Affect: Mood normal.     Lab Results Lab Results  Component Value Date   WBC 10.5 01/02/2024   HGB 14.8 01/02/2024   HCT 45.1 01/02/2024   MCV 89.0 01/02/2024   PLT 196 01/02/2024    Lab Results  Component Value Date   CREATININE 0.94 01/02/2024   BUN 11 01/02/2024   NA 135 01/02/2024   K 3.7 01/02/2024   CL 102 01/02/2024   CO2 22 01/02/2024    Lab Results  Component Value Date   ALT 18 12/31/2023   AST 23 12/31/2023   ALKPHOS 88 12/31/2023   BILITOT 0.7 12/31/2023       Loney Stank, MD [facility] 01/02/2024, 9:15 AM Evaluation of this patient requires complex antimicrobial therapy evaluation and counseling + isolation needs for disease transmission risk assessment and mitigation

## 2024-01-02 NOTE — H&P (View-Only) (Signed)
 Progress Note   Patient: [name] [name] FMW:[id number] DOB: [birth year] DOA: 12/31/2023     2 DOS: the patient was seen and examined on 01/02/2024   Brief hospital course: 43yo with h/o HTN, HLD, T2DM, morbid obesity, and tobacco use d/o who presented on 11/17 with chest pain with dyspnea.  CXR with RLL opacity; CTA with septic emboli without PE.  DVT US  + for DVT.  Patient with hemoptysis so he was started on heparin.    Assessment & Plan Septic pulmonary embolism [facility]) Patient presented with shortness, chest pain, myalgias; noted to have leukocytosis CT chest showed findings concerning for pulmonary septic emboli Blood cultures NTD Echo without apparent vegetation; needs TEE Continue IV antibiotics (vancomycin , Zosyn -> Ceftriaxone per ID) ID consulted Report small amount of hemoptysis Acute DVT (deep venous thrombosis) (HCC) No PE on CTA but DVT US  positive for acute DVT of L peroneal veins Started on heparin given presenting c/o hemoptysis Hypertension Resume Norvasc  Hold for now hydrochlorothiazide  and lisinopril   Type 2 diabetes mellitus with hyperglycemia, without long-term current use of insulin  (HCC) Last A1c was 6.4, good control Hold Trulicity  Cover with moderate-scale SSI Carb modified diet  Tobacco use Encourage cessation Patch ordered Hypertriglyceridemia Continue rosuvastatin  Obesity, Class III, BMI 40-49.9 (morbid obesity) (HCC) Body mass index is 46.96 kg/m.SABRA  Weight loss should be encouraged Outpatient PCP/bariatric medicine f/u encouraged Significantly low or high BMI is associated with higher medical risk including morbidity and mortality       Consultants: None  Procedures: Echocardiogram 11/17 DVT US  11/17  Antibiotics: Zosyn 11/17- Vancomycin  11/17-  30 Day Unplanned Readmission Risk Score    Flowsheet Row ED to Hosp-Admission (Current) from 12/31/2023 in [facility]  30 Day Unplanned  Readmission Risk Score (%) 10.65 Filed at 01/02/2024 0801    This score is the patient's risk of an unplanned readmission within 30 days of being discharged (0 -100%). The score is based on dignosis, age, lab data, medications, orders, and past utilization.   Low:  0-14.9   Medium: 15-21.9   High: 22-29.9   Extreme: 30 and above           Subjective: Feeling ok, much better.  Wants to go home ASAP.   Objective: Vitals:   01/02/24 0630 01/02/24 1159  BP: (!) 140/83 129/75  Pulse:  73  Resp:  18  Temp:  98.6 F (37 C)  SpO2:      Intake/Output Summary (Last 24 hours) at 01/02/2024 1648 Last data filed at 01/02/2024 1235 Gross per 24 hour  Intake 1410.3 ml  Output 600 ml  Net 810.3 ml   Filed Weights   12/31/23 2145 01/02/24 0500  Weight: (!) 170 kg (!) 175 kg    Exam:  General:  Appears calm and comfortable and is in NAD, on RA Eyes:  normal lids, iris ENT:  grossly normal hearing, lips & tongue, mmm Cardiovascular:  RRR. No LE edema.  Respiratory:   CTA bilaterally with no wheezes/rales/rhonchi.  Normal respiratory effort. Abdomen:  soft, NT, ND Skin:  no rash or induration seen on limited exam Musculoskeletal:  grossly normal tone BUE/BLE, good ROM, no bony abnormality Psychiatric:  grossly normal mood and affect, speech fluent and appropriate, AOx3 Neurologic:  CN 2-12 grossly intact, moves all extremities in coordinated fashion  Data Reviewed: I have reviewed the patient's lab results since admission.  Pertinent labs for today include:   Glucose 123 Normal CBC  Family Communication: Girlfriend was present throughout     Code Status: Full Code   Disposition: Status is: Inpatient Remains inpatient appropriate because: ongoing management     Time spent: 50 minutes  Unresulted Labs (From admission, onward)     Start     Ordered   01/03/24 0500  CBC  Daily,   R      01/01/24 1825   01/02/24 2200  Heparin level (unfractionated)   Once-Timed,   TIMED        01/02/24 1357             Author: Delon Herald, MD 01/02/2024 4:48 PM  For on call review [email or web address].

## 2024-01-02 NOTE — Hospital Course (Signed)
 43yo with h/o HTN, HLD, T2DM, morbid obesity, and tobacco use d/o who presented on 11/17 with chest pain with dyspnea.  CXR with RLL opacity; CTA with septic emboli without PE.  DVT US  + for DVT.  Patient with hemoptysis so he was started on heparin.

## 2024-01-03 ENCOUNTER — Inpatient Hospital Stay (HOSPITAL_COMMUNITY): Payer: Self-pay

## 2024-01-03 ENCOUNTER — Inpatient Hospital Stay (HOSPITAL_COMMUNITY): Payer: Self-pay | Admitting: Anesthesiology

## 2024-01-03 ENCOUNTER — Other Ambulatory Visit (HOSPITAL_COMMUNITY): Payer: Self-pay

## 2024-01-03 ENCOUNTER — Encounter (HOSPITAL_COMMUNITY): Admission: EM | Disposition: A | Payer: Self-pay | Source: Home / Self Care | Attending: Internal Medicine

## 2024-01-03 ENCOUNTER — Encounter (HOSPITAL_COMMUNITY): Payer: Self-pay | Admitting: Cardiovascular Disease

## 2024-01-03 DIAGNOSIS — I2690 Septic pulmonary embolism without acute cor pulmonale: Secondary | ICD-10-CM

## 2024-01-03 DIAGNOSIS — I10 Essential (primary) hypertension: Secondary | ICD-10-CM

## 2024-01-03 DIAGNOSIS — E119 Type 2 diabetes mellitus without complications: Secondary | ICD-10-CM

## 2024-01-03 DIAGNOSIS — I351 Nonrheumatic aortic (valve) insufficiency: Secondary | ICD-10-CM

## 2024-01-03 DIAGNOSIS — F17210 Nicotine dependence, cigarettes, uncomplicated: Secondary | ICD-10-CM

## 2024-01-03 HISTORY — PX: TRANSESOPHAGEAL ECHOCARDIOGRAM (CATH LAB): EP1270

## 2024-01-03 LAB — HEPARIN LEVEL (UNFRACTIONATED): Heparin Unfractionated: 0.59 [IU]/mL (ref 0.30–0.70)

## 2024-01-03 LAB — CBC
HCT: 46.8 % (ref 39.0–52.0)
Hemoglobin: 15.2 g/dL (ref 13.0–17.0)
MCH: 28.5 pg (ref 26.0–34.0)
MCHC: 32.5 g/dL (ref 30.0–36.0)
MCV: 87.8 fL (ref 80.0–100.0)
Platelets: 263 K/uL (ref 150–400)
RBC: 5.33 MIL/uL (ref 4.22–5.81)
RDW: 13.6 % (ref 11.5–15.5)
WBC: 11.3 K/uL — ABNORMAL HIGH (ref 4.0–10.5)
nRBC: 0 % (ref 0.0–0.2)

## 2024-01-03 LAB — BASIC METABOLIC PANEL WITH GFR
Anion gap: 11 (ref 5–15)
BUN: 10 mg/dL (ref 6–20)
CO2: 23 mmol/L (ref 22–32)
Calcium: 9.3 mg/dL (ref 8.9–10.3)
Chloride: 104 mmol/L (ref 98–111)
Creatinine, Ser: 1.02 mg/dL (ref 0.61–1.24)
GFR, Estimated: >60 mL/min (ref >60)
Glucose, Bld: 105 mg/dL — ABNORMAL HIGH (ref 70–99)
Potassium: 3.8 mmol/L (ref 3.5–5.1)
Sodium: 137 mmol/L (ref 135–145)

## 2024-01-03 LAB — GLUCOSE, CAPILLARY
Glucose-Capillary: 107 mg/dL — ABNORMAL HIGH (ref 70–99)
Glucose-Capillary: 115 mg/dL — ABNORMAL HIGH (ref 70–99)
Glucose-Capillary: 105 mg/dL — ABNORMAL HIGH (ref 70–99)

## 2024-01-03 LAB — ECHO TEE

## 2024-01-03 SURGERY — TRANSESOPHAGEAL ECHOCARDIOGRAM (TEE) (CATHLAB)
Anesthesia: Monitor Anesthesia Care

## 2024-01-03 MED ORDER — BUTAMBEN-TETRACAINE-BENZOCAINE 2-2-14 % EX AERO
INHALATION_SPRAY | CUTANEOUS | Status: AC
  Filled 2024-01-03: qty 20

## 2024-01-03 MED ORDER — APIXABAN 5 MG PO TABS
10.0000 mg | ORAL_TABLET | Freq: Once | ORAL | Status: AC
  Administered 2024-01-03: 10 mg via ORAL
  Filled 2024-01-03: qty 2

## 2024-01-03 MED ORDER — NICOTINE 14 MG/24HR TD PT24
14.0000 mg | MEDICATED_PATCH | Freq: Every day | TRANSDERMAL | 0 refills | Status: AC | PRN
  Filled 2024-01-03: qty 28, 28d supply, fill #0

## 2024-01-03 MED ORDER — SODIUM CHLORIDE 0.9 % IV SOLN
2.0000 g | Freq: Every day | INTRAVENOUS | Status: DC
  Administered 2024-01-03: 2 g via INTRAVENOUS
  Filled 2024-01-03: qty 20

## 2024-01-03 MED ORDER — APIXABAN (ELIQUIS) VTE STARTER PACK (10MG AND 5MG)
ORAL_TABLET | ORAL | 0 refills | Status: DC
  Filled 2024-01-03: qty 74, 30d supply, fill #0

## 2024-01-03 MED ORDER — SODIUM CHLORIDE 0.9 % IV SOLN: INTRAVENOUS | Status: DC | PRN

## 2024-01-03 MED ORDER — BUTAMBEN-TETRACAINE-BENZOCAINE 2-2-14 % EX AERO
INHALATION_SPRAY | CUTANEOUS | Status: DC | PRN
  Administered 2024-01-03: 1 via TOPICAL

## 2024-01-03 MED ORDER — APIXABAN 5 MG PO TABS: 10.0000 mg | ORAL_TABLET | Freq: Once | ORAL | Status: DC

## 2024-01-03 MED ORDER — PROPOFOL 500 MG/50ML IV EMUL
INTRAVENOUS | Status: DC | PRN
Start: 2024-01-03 — End: 2024-01-03
  Administered 2024-01-03: 100 ug/kg/min via INTRAVENOUS

## 2024-01-03 MED ORDER — SODIUM CHLORIDE 0.9 % IV SOLN: INTRAVENOUS | Status: DC

## 2024-01-03 NOTE — Plan of Care (Signed)
  Problem: Education: Goal: Ability to describe self-care measures that may prevent or decrease complications (Diabetes Survival Skills Education) will improve Outcome: Progressing   Problem: Fluid Volume: Goal: Ability to maintain a balanced intake and output will improve Outcome: Progressing   Problem: Metabolic: Goal: Ability to maintain appropriate glucose levels will improve Outcome: Progressing   Problem: Nutritional: Goal: Maintenance of adequate nutrition will improve Outcome: Progressing   Problem: Clinical Measurements: Goal: Will remain free from infection Outcome: Progressing   Problem: Coping: Goal: Level of anxiety will decrease Outcome: Progressing   Problem: Elimination: Goal: Will not experience complications related to bowel motility Outcome: Progressing

## 2024-01-03 NOTE — Assessment & Plan Note (Signed)
-  Encourage cessation.   -Patch ordered  

## 2024-01-03 NOTE — Progress Notes (Signed)
 [facility]  Date of Admission:  12/31/2023   Total days of inpatient antibiotics 3  Principal Problem:   Septic pulmonary embolism (HCC) Active Problems:   Hypertension   Tobacco use   Obesity, Class III, BMI 40-49.9 (morbid obesity) (HCC)   Type 2 diabetes mellitus with hyperglycemia, without long-term current use of insulin  (HCC)   Hypertriglyceridemia   Acute DVT (deep venous thrombosis) [facility])          Assessment: 44 year old male with diabetes, hypercholesterolemia intentional overdose admission suicide event 05/20/23 presented with shortness of breath chest pain found to have #Concern for septic emboli to lungs in the setting of left peroneal vein acute DVT - CT showed bilateral patchy bronchial vascular consolidative and peripheral ground glass opacities without cavitation confirmed septic emboli.  Patient afebrile admission, WBC 17K.  Blood cultures obtained.  Patient started on broad-spectrum antibiotics - He denies drug use, reviewed UDS from 05/16/2023 only positive for THC.  Past UDS positive for opiates noted to have Norco on list at that time. - Tattoo on chest about a month ago.  No issues with healing patient notes professional tattoo. #Diabetes mellitus, A1c 6.4 -tee negative Recommendations: -I discussed CT findings of chest with radiology Dr. Jerri today.  Noted that was that embolized would typically not present in this manner.  The differential organized pneumonia/thrombus is less likely.  Could be fungal/atypical infection/Aspergillus. - Ordered Aspergillus antigen, Fungitell serum. - Patient is clinically stable I think we will stop antibiotics.  Follow-up with infectious disease outpatient as far as workup ordered.  Recommend pulmonology follow-up with repeat imaging outpatient as well -Communicated plan with primary - ID will sign off Microbiology:   Antibiotics: Vancomycin  and pip-tazo  11/17- Cultures: Blood 11/17 Urine  Other   SUBJECTIVE: Resting in bed Interval:  Afebrile overnight. Wbc 11k  Review of Systems: Review of Systems  All other systems reviewed and are negative.    Scheduled Meds:  [MAR Hold] amLODipine   5 mg Oral Daily   [MAR Hold] insulin  aspart  0-9 Units Subcutaneous TID WC   [MAR Hold] polyethylene glycol  17 g Oral BID   [MAR Hold] rosuvastatin   20 mg Oral Daily   [MAR Hold] senna-docusate  1 tablet Oral BID   [MAR Hold] sodium chloride  flush  3 mL Intravenous Q12H   Continuous Infusions:  sodium chloride      [MAR Hold] cefTRIAXone  (ROCEPHIN )  IV 2 g (01/03/24 0903)   heparin  3,300 Units/hr (01/03/24 1142)   [MAR Hold] vancomycin  Stopped (01/03/24 0141)   PRN Meds:.[MAR Hold] acetaminophen  **OR** [MAR Hold] acetaminophen , [MAR Hold] benzonatate , [MAR Hold] ipratropium-albuterol , [MAR Hold] nicotine , [MAR Hold] senna-docusate Allergies  Allergen Reactions   Tea Anaphylaxis   Porcine (Pork) Protein-Containing Drug Products     Patient said he does not eat pork    OBJECTIVE: Vitals:   01/03/24 0558 01/03/24 1038 01/03/24 1243 01/03/24 1244  BP: (!) 153/104 129/86 (!) 181/92 (!) 175/100  Pulse:  73 78 79  Resp:  (!) 24 (!) 23 (!) 27  Temp:  98 F (36.7 C)    TempSrc:  Temporal    SpO2:  95% 97% 92%  Weight:      Height:       Body mass index is 48.79 kg/m.  Physical Exam Constitutional:      General: He is not in acute distress.    Appearance: He is normal weight. He is not toxic-appearing.  HENT:     Head: Normocephalic and atraumatic.     Right Ear: External ear normal.     Left Ear: External ear normal.     Nose: No congestion or rhinorrhea.     Mouth/Throat:     Mouth: Mucous membranes are moist.     Pharynx: Oropharynx is clear.  Eyes:     Extraocular Movements: Extraocular movements intact.     Conjunctiva/sclera: Conjunctivae normal.     Pupils: Pupils are equal, round, and reactive to light.   Cardiovascular:     Rate and Rhythm: Normal rate and regular rhythm.     Heart sounds: No murmur heard.    No friction rub. No gallop.  Pulmonary:     Effort: Pulmonary effort is normal.     Breath sounds: Normal breath sounds.  Abdominal:     General: Abdomen is flat. Bowel sounds are normal.     Palpations: Abdomen is soft.  Musculoskeletal:        General: No swelling. Normal range of motion.     Cervical back: Normal range of motion and neck supple.  Skin:    General: Skin is warm and dry.  Neurological:     General: No focal deficit present.     Mental Status: He is oriented to person, place, and time.  Psychiatric:        Mood and Affect: Mood normal.       Lab Results Lab Results  Component Value Date   WBC 11.3 (H) 01/03/2024   HGB 15.2 01/03/2024   HCT 46.8 01/03/2024   MCV 87.8 01/03/2024   PLT 263 01/03/2024    Lab Results  Component Value Date   CREATININE 1.02 01/03/2024   BUN 10 01/03/2024   NA 137 01/03/2024   K 3.8 01/03/2024   CL 104 01/03/2024   CO2 23 01/03/2024    Lab Results  Component Value Date   ALT 18 12/31/2023   AST 23 12/31/2023   ALKPHOS 88 12/31/2023   BILITOT 0.7 12/31/2023        Loney Stank, MD [facility] 01/03/2024, 1:06 PM  Evaluation of this patient requires complex antimicrobial therapy evaluation and counseling + isolation needs for disease transmission risk assessment and mitigation

## 2024-01-03 NOTE — Assessment & Plan Note (Addendum)
 Patient presented with shortness, chest pain, myalgias; noted to have leukocytosis CT chest showed findings concerning for pulmonary septic emboli Blood cultures NTD Echo without apparent vegetation; TEE also negative Continue IV antibiotics (vancomycin , Zosyn -> Ceftriaxone per ID) ID consulted Report small amount of hemoptysis on presentation Based on markedly improved clinical presentation, ID recommends stopping antibiotics at this time Fungal studies added on Outpatient f/u with ID and pulmonology

## 2024-01-03 NOTE — Discharge Summary (Addendum)
 Physician Discharge Summary   Patient: [name] [name] MRN: [id number] DOB: [birth year]  Admit date:     12/31/2023  Discharge date: 01/03/24  Discharge Physician: Delon Herald   PCP: Almarie Waddell NOVAK, NP   Recommendations at discharge:   You are being discharged on Eliquis; take as directed and do not miss doses.  You will need this medication for at least 3 months. Follow up with NP Beck in 1-2 weeks; call for an appointment Follow up with Infectious Disease Follow up with pulmonology Call your insurance company to verify prescription drug coverage.  For now, we are able to provide a 30-day supply. STOP smoking!  Obtain nicotine patches by calling 1-800-QUITNOW or going onto the [place] Quit Line website at [email or web address]   Discharge Diagnoses: Principal Problem:   Septic pulmonary embolism (HCC) Active Problems:   Hypertension   Tobacco use   Obesity, Class III, BMI 40-49.9 (morbid obesity) (HCC)   Type 2 diabetes mellitus with hyperglycemia, without long-term current use of insulin  (HCC)   Hypertriglyceridemia   Acute DVT (deep venous thrombosis) [facility])    Hospital Course: 43yo with h/o HTN, HLD, T2DM, morbid obesity, and tobacco use d/o who presented on 11/17 with chest pain with dyspnea.  CXR with RLL opacity; CTA with septic emboli without PE.  DVT US  + for DVT.  Patient with hemoptysis so he was started on heparin.  Assessment and Plan:  Assessment & Plan Septic pulmonary embolism [facility]) Patient presented with shortness, chest pain, myalgias; noted to have leukocytosis CT chest showed findings concerning for pulmonary septic emboli Blood cultures NTD Echo without apparent vegetation; TEE also negative Continue IV antibiotics (vancomycin , Zosyn -> Ceftriaxone per ID) ID consulted Report small amount of hemoptysis on presentation Based on markedly improved clinical presentation, ID recommends stopping antibiotics at this time Fungal studies added  on Outpatient f/u with ID and pulmonology Acute DVT (deep venous thrombosis) (HCC) No PE on CTA but DVT US  positive for acute DVT of L peroneal veins Started on heparin given presenting c/o hemoptysis He has done well and will transition to DOAC therapy (cost comparison pending) Hypertension Resume Norvasc  Resume hydrochlorothiazide  and lisinopril   Type 2 diabetes mellitus with hyperglycemia, without long-term current use of insulin  (HCC) Last A1c was 6.4, good control Resume Trulicity  Carb modified diet  Tobacco use Encourage cessation Patch ordered Hypertriglyceridemia Continue rosuvastatin  Obesity, Class III, BMI 40-49.9 (morbid obesity) (HCC) Body mass index is 48.79 kg/m.SABRA  Weight loss should be encouraged on an ongoing basis; he reports that he was previously well over 500 pounds and has lost 200+ pounds through diet and exercise Outpatient PCP/bariatric medicine f/u encouraged Significantly low or high BMI is associated with higher medical risk including morbidity and mortality       Consultants: ID Cardiology [facility] team   Procedures: Echocardiogram 11/17 DVT US  11/17 TEE 11/20   Antibiotics: Zosyn 11/17-19 Vancomycin  11/17- Ceftriaxone 11/19-      Pain control - [place]  Controlled Substance Reporting System database was reviewed. and patient was instructed, not to drive, operate heavy machinery, perform activities at heights, swimming or participation in water activities or provide baby-sitting services while on Pain, Sleep and Anxiety Medications; until their outpatient Physician has advised to do so again. Also recommended to not to take more than prescribed Pain, Sleep and Anxiety Medications.   Disposition: Home Diet recommendation:  Cardiac diet DISCHARGE MEDICATION: Allergies as of 01/03/2024       Reactions   Tea Anaphylaxis   Porcine (  pork) Protein-containing Drug Products    Patient said he does not eat pork        Medication List      TAKE these medications    albuterol  108 (90 Base) MCG/ACT inhaler Commonly known as: VENTOLIN  HFA Inhale 1-2 puffs into the lungs every 6 (six) hours as needed for wheezing or shortness of breath.   amLODipine  5 MG tablet Commonly known as: NORVASC  TAKE 1 TABLET(5 MG) BY MOUTH DAILY   Apixaban  Starter Pack (10mg  and 5mg ) Commonly known as: ELIQUIS  STARTER PACK Take as directed on package: start with two-5mg  tablets twice daily for 7 days. On day 8, switch to one-5mg  tablet twice daily.   azelastine  0.1 % nasal spray Commonly known as: ASTELIN  Place 2 sprays into both nostrils 2 (two) times daily. Use in each nostril as directed What changed:  when to take this reasons to take this   levocetirizine 5 MG tablet Commonly known as: XYZAL  Take 1 tablet (5 mg total) by mouth every evening. What changed:  when to take this reasons to take this   lisinopril -hydrochlorothiazide  20-12.5 MG tablet Commonly known as: ZESTORETIC  TAKE 1 TABLET BY MOUTH DAILY   nicotine  14 mg/24hr patch Commonly known as: NICODERM CQ  - dosed in mg/24 hours Place 1 patch (14 mg total) onto the skin daily as needed (tobacco dependence).   rosuvastatin  20 MG tablet Commonly known as: CRESTOR  Take 1 tablet (20 mg total) by mouth daily.   Trulicity  3 MG/0.5ML Soaj Generic drug: Dulaglutide  Inject 3 mg into the skin once a week.        Follow-up Information     Almarie Birmingham B, NP Follow up in 1 week(s).   Specialties: Family Medicine, Emergency Medicine Contact information: [address] [place] [address] [phone]         Dennise Kingsley, MD Follow up.   Specialty: Infectious Diseases Contact information: [address], [address] [place] [address] [phone]         [facility] [place]. Schedule an appointment as soon as possible for a visit.   Specialty: Pulmonology Contact information: [address] [place] [address] [place]  [address] [phone] Additional information: [address]  [address]  [place], [place] [address]               Discharge Exam:    Subjective: Feeling great, wants to go home.  Negative TEE.   Objective: Vitals:   01/03/24 1352 01/03/24 1632  BP: (!) 137/91 (!) 169/121  Pulse: 78 72  Resp: 11 (!) 22  Temp:  98.1 F (36.7 C)  SpO2: 96% 99%    Intake/Output Summary (Last 24 hours) at 01/03/2024 1651 Last data filed at 01/03/2024 0736 Gross per 24 hour  Intake 1207.26 ml  Output 1300 ml  Net -92.74 ml   Filed Weights   12/31/23 2145 01/02/24 0500 01/03/24 0500  Weight: (!) 170 kg (!) 175 kg (!) 181.8 kg    Exam:  General:  Appears calm and comfortable and is in NAD Eyes:  normal lids, iris ENT:  grossly normal hearing, lips & tongue, mmm Cardiovascular:  RRR, no m/r/g. No LE edema.  Respiratory:   CTA bilaterally with no wheezes/rales/rhonchi.  Normal respiratory effort. Abdomen:  soft, NT, ND Skin:  no rash or induration seen on limited exam Musculoskeletal:  grossly normal tone BUE/BLE, good ROM, no bony abnormality Psychiatric:  grossly normal mood and  affect, speech fluent and appropriate, AOx3 Neurologic:  CN 2-12 grossly intact, moves all extremities in coordinated fashion  Data Reviewed: I have reviewed the patient's lab results since admission.  Pertinent labs for today include:   Blood cultures NTD    Condition at discharge: good  The results of significant diagnostics from this hospitalization (including imaging, microbiology, ancillary and laboratory) are listed below for reference.   Imaging Studies: ECHO TEE Result Date: 01/03/2024    TRANSESOPHOGEAL ECHO REPORT   Patient Name:   [name] [name] Date of Exam: 01/03/2024 Medical Rec #:  [id number]          Height:       76.0 in Accession #:    [id number]         Weight:       400.8 lb Date of Birth:  12/29/1980         BSA:          2.977 m Patient Age:     43 years           BP:           199/107 mmHg Patient Gender: M                  HR:           87 bpm. Exam Location:  Inpatient Procedure: Transesophageal Echo, Cardiac Doppler and Color Doppler (Both            Spectral and Color Flow Doppler were utilized during procedure). Indications:     Subacute bacterial endocarditis;  History:         Patient has prior history of Echocardiogram examinations, most                  recent 01/01/2024. LVH, Signs/Symptoms:Altered Mental Status;                  Risk Factors:Hypertension and Current Smoker. Septic plumonary                  embolism.  Sonographer:     Ellouise Mose RDCS Referring Phys:  [id number] ZANE ADAMS Diagnosing Phys: Jerel Balding MD PROCEDURE: After discussion of the risks and benefits of a TEE, an informed consent was obtained from the patient. The transesophogeal probe was passed without difficulty through the esophogus of the patient. Imaged were obtained with the patient in a supine position. Local oropharyngeal anesthetic was provided with Cetacaine . Sedation performed by different physician. The patient was monitored while under deep sedation. Anesthestetic sedation was provided intravenously by Anesthesiology: 272mg  of Propofol . The patient's vital signs; including heart rate, blood pressure, and oxygen saturation; remained stable throughout the procedure. The patient developed no complications during the procedure.  IMPRESSIONS  1. Left ventricular ejection fraction, by estimation, is 65 to 70%. The left ventricle has normal function. The left ventricle has no regional wall motion abnormalities. There is moderate concentric left ventricular hypertrophy.  2. Right ventricular systolic function is normal. The right ventricular size is moderately enlarged. Tricuspid regurgitation signal is inadequate for assessing PA pressure.  3. Left atrial size was moderately dilated. No left atrial/left atrial appendage thrombus was detected. The LAA emptying  velocity was 44 cm/s.  4. Right atrial size was moderately dilated.  5. The mitral valve is normal in structure. Trivial mitral valve regurgitation. No evidence of mitral stenosis.  6. The aortic valve is tricuspid. Aortic valve regurgitation is mild. No aortic stenosis is present.  7. Aortic dilatation noted. There is mild dilatation of the ascending aorta, measuring 41 mm. Conclusion(s)/Recommendation(s): No evidence of vegetation/infective endocarditis on this transesophageael echocardiogram. FINDINGS  Left Ventricle: Left ventricular ejection fraction, by estimation, is 65 to 70%. The left ventricle has normal function. The left ventricle has no regional wall motion abnormalities. The left ventricular internal cavity size was normal in size. There is  moderate concentric left ventricular hypertrophy. Right Ventricle: The right ventricular size is moderately enlarged. No increase in right ventricular wall thickness. Right ventricular systolic function is normal. Tricuspid regurgitation signal is inadequate for assessing PA pressure. Left Atrium: Left atrial size was moderately dilated. No left atrial/left atrial appendage thrombus was detected. The LAA emptying velocity was 44 cm/s. Right Atrium: Right atrial size was moderately dilated. Pericardium: There is no evidence of pericardial effusion. Mitral Valve: The mitral valve is normal in structure. Trivial mitral valve regurgitation. No evidence of mitral valve stenosis. Tricuspid Valve: The tricuspid valve is normal in structure. Tricuspid valve regurgitation is trivial. No evidence of tricuspid stenosis. Aortic Valve: The aortic valve is tricuspid. Aortic valve regurgitation is mild. No aortic stenosis is present. Pulmonic Valve: The pulmonic valve was normal in structure. Pulmonic valve regurgitation is not visualized. No evidence of pulmonic stenosis. Aorta: Aortic dilatation noted and the aortic root is normal in size and structure. There is mild dilatation  of the ascending aorta, measuring 41 mm. IAS/Shunts: No atrial level shunt detected by color flow Doppler. Additional Comments: Spectral Doppler performed.  AORTA Ao Asc diam: 4.10 cm Jerel Balding MD Electronically signed by Jerel Balding MD Signature Date/Time: 01/03/2024/2:10:43 PM    Final    EP STUDY Result Date: 01/03/2024 See surgical note for result.  VAS US  LOWER EXTREMITY VENOUS (DVT) Result Date: 01/02/2024  Lower Venous DVT Study Patient Name:  [name] [name]  Date of Exam:   01/01/2024 Medical Rec #: [id number]           Accession #:    [id number] Date of Birth: [birth year]          Patient Gender: M Patient Age:   19 years Exam Location:  [facility] Procedure:      VAS US  LOWER EXTREMITY VENOUS (DVT) Referring Phys: OWEN REGALADO --------------------------------------------------------------------------------  Indications: Edema.  Limitations: Body habitus. Performing Technologist: Edilia Elden Appl  Examination Guidelines: A complete evaluation includes B-mode imaging, spectral Doppler, color Doppler, and power Doppler as needed of all accessible portions of each vessel. Bilateral testing is considered an integral part of a complete examination. Limited examinations for reoccurring indications may be performed as noted. The reflux portion of the exam is performed with the patient in reverse Trendelenburg.  +---------+---------------+---------+-----------+----------+--------------+ RIGHT    CompressibilityPhasicitySpontaneityPropertiesThrombus Aging +---------+---------------+---------+-----------+----------+--------------+ CFV      Full           Yes      Yes                                 +---------+---------------+---------+-----------+----------+--------------+ SFJ      Full           Yes      Yes                                 +---------+---------------+---------+-----------+----------+--------------+ FV Prox  Full                                                         +---------+---------------+---------+-----------+----------+--------------+  FV Mid   Full                                                        +---------+---------------+---------+-----------+----------+--------------+ FV DistalFull                                                        +---------+---------------+---------+-----------+----------+--------------+ PFV      Full                                                        +---------+---------------+---------+-----------+----------+--------------+ POP      Full           Yes      Yes                                 +---------+---------------+---------+-----------+----------+--------------+ PTV      Full                                                        +---------+---------------+---------+-----------+----------+--------------+ PERO     Full                                                        +---------+---------------+---------+-----------+----------+--------------+   +---------+---------------+---------+-----------+----------+--------------+ LEFT     CompressibilityPhasicitySpontaneityPropertiesThrombus Aging +---------+---------------+---------+-----------+----------+--------------+ CFV      Full           Yes      Yes                                 +---------+---------------+---------+-----------+----------+--------------+ SFJ      Full           Yes      Yes                                 +---------+---------------+---------+-----------+----------+--------------+ FV Prox  Full                                                        +---------+---------------+---------+-----------+----------+--------------+ FV Mid   Full                                                        +---------+---------------+---------+-----------+----------+--------------+  FV DistalFull                                                         +---------+---------------+---------+-----------+----------+--------------+ PFV      Full                                                        +---------+---------------+---------+-----------+----------+--------------+ POP      Full           Yes      Yes                  Rouleaux flow. +---------+---------------+---------+-----------+----------+--------------+ PTV      Full                                                        +---------+---------------+---------+-----------+----------+--------------+ PERO     Partial        No       No                                  +---------+---------------+---------+-----------+----------+--------------+     Summary: RIGHT: - There is no evidence of deep vein thrombosis in the lower extremity.  - No cystic structure found in the popliteal fossa.  LEFT: - Findings consistent with acute deep vein thrombosis involving the left peroneal veins.  - No cystic structure found in the popliteal fossa.  *See table(s) above for measurements and observations. Electronically signed by Debby Robertson on 01/02/2024 at 8:14:17 AM.    Final    ECHOCARDIOGRAM COMPLETE Result Date: 01/01/2024    ECHOCARDIOGRAM REPORT   Patient Name:   [name] [name] Date of Exam: 01/01/2024 Medical Rec #:  [id number]          Height:       76.0 in Accession #:    [id number]         Weight:       374.8 lb Date of Birth:  11/17/1980         BSA:          2.893 m Patient Age:    43 years           BP:           128/70 mmHg Patient Gender: M                  HR:           72 bpm. Exam Location:  Inpatient Procedure: 2D Echo and Intracardiac Opacification Agent (Both Spectral and Color            Flow Doppler were utilized during procedure). Indications:    Pulmonary embolus  History:        Patient has no prior history of Echocardiogram examinations.                 Risk Factors:Hypertension.  Sonographer:    Charmaine Gaskins  Referring Phys: [id number] [facility]  Sonographer  Comments: Technically challenging study due to limited acoustic windows and Technically difficult study due to poor echo windows. Image acquisition challenging due to patient body habitus. IMPRESSIONS  1. Left ventricular ejection fraction, by estimation, is 70 to 75%. The left ventricle has hyperdynamic function. The left ventricle has no regional wall motion abnormalities. The left ventricular internal cavity size was mildly dilated. There is mild concentric left ventricular hypertrophy. Left ventricular diastolic parameters were normal.  2. Right ventricular systolic function is normal. The right ventricular size is normal.  3. Left atrial size was mildly dilated.  4. The mitral valve is normal in structure. Trivial mitral valve regurgitation.  5. The aortic valve is normal in structure. Aortic valve regurgitation is not visualized.  6. Aortic dilatation noted. There is mild dilatation of the ascending aorta, measuring 41 mm. FINDINGS  Left Ventricle: Left ventricular ejection fraction, by estimation, is 70 to 75%. The left ventricle has hyperdynamic function. The left ventricle has no regional wall motion abnormalities. Definity contrast agent was given IV to delineate the left ventricular endocardial borders. The left ventricular internal cavity size was mildly dilated. There is mild concentric left ventricular hypertrophy. Left ventricular diastolic parameters were normal. Right Ventricle: The right ventricular size is normal. Right vetricular wall thickness was not assessed. Right ventricular systolic function is normal. Left Atrium: Left atrial size was mildly dilated. Right Atrium: Right atrial size was normal in size. Pericardium: There is no evidence of pericardial effusion. Mitral Valve: The mitral valve is normal in structure. Trivial mitral valve regurgitation. Tricuspid Valve: The tricuspid valve is normal in structure. Tricuspid valve regurgitation is trivial. Aortic Valve: The aortic valve is normal  in structure. Aortic valve regurgitation is not visualized. Pulmonic Valve: The pulmonic valve was normal in structure. Pulmonic valve regurgitation is not visualized. Aorta: The aortic root is normal in size and structure and aortic dilatation noted. There is mild dilatation of the ascending aorta, measuring 41 mm. IAS/Shunts: No atrial level shunt detected by color flow Doppler.  LEFT VENTRICLE PLAX 2D LVIDd:         5.60 cm   Diastology LVIDs:         3.60 cm   LV e' medial:    7.07 cm/s LV PW:         1.20 cm   LV E/e' medial:  14.0 LV IVS:        1.20 cm   LV e' lateral:   8.05 cm/s LVOT diam:     2.10 cm   LV E/e' lateral: 12.3 LVOT Area:     3.46 cm  RIGHT VENTRICLE RV Basal diam:  3.20 cm RV Mid diam:    3.30 cm TAPSE (M-mode): 2.6 cm LEFT ATRIUM              Index LA diam:        3.60 cm  1.24 cm/m LA Vol (A2C):   54.0 ml  18.67 ml/m LA Vol (A4C):   102.0 ml 35.26 ml/m LA Biplane Vol: 81.1 ml  28.03 ml/m   AORTA Ao Root diam: 3.10 cm Ao Asc diam:  4.10 cm MITRAL VALVE MV Area (PHT): 3.49 cm    SHUNTS MV E velocity: 99.20 cm/s  Systemic Diam: 2.10 cm MV A velocity: 79.00 cm/s MV E/A ratio:  1.26 Vina Gull MD Electronically signed by Vina Gull MD Signature Date/Time: 01/01/2024/3:37:51 PM    Final    CT ABDOMEN PELVIS  W CONTRAST Result Date: 01/01/2024 EXAM: CT ABDOMEN AND PELVIS WITH CONTRAST 01/01/2024 01:11:57 AM TECHNIQUE: CT of the abdomen and pelvis was performed with the administration of intravenous contrast. 100 mL of iohexol  (OMNIPAQUE ) 300 MG/ML solution was administered. Multiplanar reformatted images are provided for review. Automated exposure control, iterative reconstruction, and/or weight-based adjustment of the mA/kV was utilized to reduce the radiation dose to as low as reasonably achievable. COMPARISON: None available. CLINICAL HISTORY: Sepsis. FINDINGS: LOWER CHEST: Bilateral lower lobes demonstrate patchy peribronchovascular nodular-like centrally consolidative and  peripherally ground-glass opacities. Please see separately dictated CT angiography chest 12/31/2023. LIVER: The liver is unremarkable. GALLBLADDER AND BILE DUCTS: Contracted gallbladder. No biliary ductal dilatation. SPLEEN: No acute abnormality. PANCREAS: No acute abnormality. ADRENAL GLANDS: No acute abnormality. KIDNEYS, URETERS AND BLADDER: No stones in the kidneys or ureters. No hydronephrosis. No perinephric or periureteral stranding. Urinary bladder is unremarkable. GI AND BOWEL: No small bowel or large bowel wall thickening or dilatation. The appendix is normal. Stomach demonstrates no acute abnormality. There is no bowel obstruction. PERITONEUM AND RETROPERITONEUM: No ascites. No free air. VASCULATURE: Atherosclerotic plaque. Aorta is normal in caliber. LYMPH NODES: No lymphadenopathy. REPRODUCTIVE ORGANS: Prostate unremarkable. BONES AND SOFT TISSUES: No acute osseous abnormality. No focal soft tissue abnormality. IMPRESSION: 1. Bilateral lower lobe peribronchovascular nodular consolidative and peripheral ground-glass opacities, refer to separately dictated CT angiography chest 12/31/2023 for full thoracic assessment. 2. No acute intraabdominal or intrapelvic findings. Electronically signed by: Morgane Naveau MD 01/01/2024 01:23 AM EST RP Workstation: [id number]   CT Angio Chest PE W and/or Wo Contrast Result Date: 12/31/2023 EXAM: CTA CHEST 12/31/2023 08:23:04 PM TECHNIQUE: CTA of the chest was performed after the administration of 75 mL of iohexol  (OMNIPAQUE ) 350 MG/ML injection. Multiplanar reformatted images are provided for review. MIP images are provided for review. Automated exposure control, iterative reconstruction, and/or weight based adjustment of the mA/kV was utilized to reduce the radiation dose to as low as reasonably achievable. COMPARISON: None available. CLINICAL HISTORY: pain when taking a deep breath, shortness of breath FINDINGS: PULMONARY ARTERIES: Pulmonary arteries are  adequately opacified for evaluation. Limited evaluation of the subsegmental level for pulmonary embolus due to motion artifact. Main pulmonary artery is normal in caliber. MEDIASTINUM: The heart and pericardium demonstrate no acute abnormality. There is no acute abnormality of the thoracic aorta. LYMPH NODES: Enlarged right hilar and mediastinal lymphadenopathy, difficult to measure. No left hilar or axillary lymphadenopathy. LUNGS AND PLEURA: Bilateral patchy peribronchovascular centrally consolidative and peripherally ground glass airspace opacities. No cavitation. No evidence of pleural effusion or pneumothorax. UPPER ABDOMEN: Limited images of the upper abdomen are unremarkable. SOFT TISSUES AND BONES: No acute bone or soft tissue abnormality. IMPRESSION: 1. No pulmonary embolism identified; limited evaluation of the subsegmental level due to motion artifact. 2. Bilateral patchy peribronchovascular consolidative and peripheral ground glass airspace opacities without cavitation. Findings concerning for septic emboli. 3. Enlarged right hilar and mediastinal lymphadenopathy, difficult to measure. Findings likely reactive in etiology. Electronically signed by: Morgane Naveau MD 12/31/2023 09:24 PM EST RP Workstation: [id number]   DG Chest 2 View Result Date: 12/31/2023 EXAM: 2 VIEW(S) X-RAY OF THE CHEST 12/31/2023 06:12:00 PM COMPARISON: 02/21/2023 CLINICAL HISTORY: SOB FINDINGS: LUNGS AND PLEURA: Patchy space opacity of the right lung.  No pleural effusion. No pneumothorax. HEART AND MEDIASTINUM: No acute abnormality of the cardiac and mediastinal silhouettes. BONES AND SOFT TISSUES: No acute osseous abnormality. IMPRESSION: 1. Suspected developing patchy airspace opacity in the right lung, which may represent infection; recommend follow-up  PA and lateral chest radiograph in 34 weeks to ensure resolution. Electronically signed by: Morgane Naveau MD 12/31/2023 07:31 PM EST RP Workstation: [id number]     Microbiology: Results for orders placed or performed during the hospital encounter of 12/31/23  Blood culture (routine x 2)     Status: None (Preliminary result)   Collection Time: 12/31/23 10:10 PM   Specimen: BLOOD  Result Value Ref Range Status   Specimen Description   Final    BLOOD RIGHT ANTECUBITAL Performed at [facility], [address] [place], [place] [address]    Special Requests   Final    BOTTLES DRAWN AEROBIC ONLY Blood Culture adequate volume Performed at [facility], [address], [place], [place] [address]    Culture   Final    NO GROWTH 2 DAYS Performed at [facility], [address] [place], [place] [address]    Report Status PENDING  Incomplete  Blood culture (routine x 2)     Status: None (Preliminary result)   Collection Time: 12/31/23 10:10 PM   Specimen: BLOOD  Result Value Ref Range Status   Specimen Description   Final    BLOOD LEFT ANTECUBITAL Performed at [facility], [address] [place], [place] [address]    Special Requests   Final    BOTTLES DRAWN AEROBIC AND ANAEROBIC Blood Culture adequate volume Performed at [facility], [address] [place], [place] [address]    Culture   Final    NO GROWTH 2 DAYS Performed at [facility], [address], [place], [place] [address]    Report Status PENDING  Incomplete    Labs: CBC: Recent Labs  Lab 12/31/23 1818 01/01/24 0600 01/02/24 0038 01/03/24 0820  WBC 17.8* 15.8* 10.5 11.3*  HGB 16.3 14.6 14.8 15.2  HCT 49.1 43.8 45.1 46.8  MCV 86.7 86.7 89.0 87.8  PLT 198 198 196 263   Basic Metabolic Panel: Recent Labs  Lab 12/31/23 1818 12/31/23 2210 01/01/24 0600 01/02/24 0038 01/03/24 0820  NA 136  --  137 135 137  K 3.6  --  3.2* 3.7 3.8  CL 99  --  100 102 104  CO2 26  --  26 22 23   GLUCOSE 112*  --  105* 123* 105*  BUN 8  --  10 11 10   CREATININE 1.06  --  1.05 0.94 1.02   CALCIUM  9.5  --  8.9 8.8* 9.3  MG  --  2.0  --   --   --    Liver Function Tests: Recent Labs  Lab 12/31/23 2210  AST 23  ALT 18  ALKPHOS 88  BILITOT 0.7  PROT 7.4  ALBUMIN 3.8   CBG: Recent Labs  Lab 01/02/24 1156 01/02/24 1638 01/02/24 2212 01/03/24 0849 01/03/24 1159  GLUCAP 166* 149* 100* 107* 115*    Discharge time spent: greater than 30 minutes.  Signed: Delon Herald, MD [facility] 01/03/2024

## 2024-01-03 NOTE — Anesthesia Postprocedure Evaluation (Signed)
 Anesthesia Post Note  Patient: [name] [name]  Procedure(s) Performed: TRANSESOPHAGEAL ECHOCARDIOGRAM     Patient location during evaluation: Endoscopy Anesthesia Type: MAC Level of consciousness: awake and alert Pain management: pain level controlled Vital Signs Assessment: post-procedure vital signs reviewed and stable Respiratory status: spontaneous breathing, nonlabored ventilation, respiratory function stable and patient connected to nasal cannula oxygen Cardiovascular status: stable and blood pressure returned to baseline Postop Assessment: no apparent nausea or vomiting Anesthetic complications: no   There were no known notable events for this encounter.  Last Vitals:  Vitals:   01/03/24 1244 01/03/24 1352  BP: (!) 175/100 (!) 137/91  Pulse: 79 78  Resp: (!) 27 11  Temp:    SpO2: 92% 96%    Last Pain:  Vitals:   01/03/24 1243  TempSrc:   PainSc: 0-No pain                 Garnette DELENA Gab

## 2024-01-03 NOTE — Discharge Instructions (Signed)
 Smoking Cessation Supplies: pumpkinsearch.com.ee or call 1-800-QUIT-NOW

## 2024-01-03 NOTE — Progress Notes (Signed)
  Echocardiogram Transesophageal has been performed.  [name] [name] 01/03/2024, 12:58 PM

## 2024-01-03 NOTE — Assessment & Plan Note (Addendum)
 Resume Norvasc  Resume hydrochlorothiazide  and lisinopril 

## 2024-01-03 NOTE — Assessment & Plan Note (Addendum)
 Last A1c was 6.4, good control Resume Trulicity  Carb modified diet

## 2024-01-03 NOTE — Op Note (Signed)
 INDICATIONS: Septic pulmonary embolism  PROCEDURE:   Informed consent was obtained prior to the procedure. The risks, benefits and alternatives for the procedure were discussed and the patient comprehended these risks.  Risks include, but are not limited to, cough, sore throat, vomiting, nausea, somnolence, esophageal and stomach trauma or perforation, bleeding, low blood pressure, aspiration, pneumonia, infection, trauma to the teeth and death.    After a procedural time-out, the oropharynx was anesthetized with 20% benzocaine spray.   During this procedure the patient was administered IV propofol  by Anesthesiology, Dr. Jefm.  The transesophageal probe was inserted in the esophagus and stomach without difficulty and multiple views were obtained.  The patient was kept under observation until the patient left the procedure room.  The patient left the procedure room in stable condition.   Agitated microbubble saline contrast was not administered.  COMPLICATIONS:    There were no immediate complications.  FINDINGS:  No vegetations are seen. Mild LVH. Borderline dilated LV, LA, RA and ascending aorta may be normal for his body habitus. Mild aortic insufficiency (trileaflet valve).   RECOMMENDATIONS:     No evidence for endocarditis or cardiac source of embolism.  Time Spent Directly with the Patient:  45 minutes   [name] [name] 01/03/2024, 12:41 PM

## 2024-01-03 NOTE — Anesthesia Preprocedure Evaluation (Addendum)
 Anesthesia Evaluation  Patient identified by MRN, date of birth, ID band Patient awake    Reviewed: Allergy & Precautions, NPO status , Patient's Chart, lab work & pertinent test results  History of Anesthesia Complications Negative for: history of anesthetic complications  Airway Mallampati: III  TM Distance: >3 FB Neck ROM: Full    Dental no notable dental hx. (+) Missing, Teeth Intact, Dental Advisory Given,    Pulmonary sleep apnea , Current Smoker and Patient abstained from smoking., PE   Pulmonary exam normal breath sounds clear to auscultation       Cardiovascular hypertension, Pt. on medications and Pt. on home beta blockers (-) angina (-) Past MI Normal cardiovascular exam Rhythm:Regular Rate:Normal     Neuro/Psych  PSYCHIATRIC DISORDERS  Depression    TIA   GI/Hepatic   Endo/Other  diabetes, Type 2  Class 3 obesity  Renal/GU      Musculoskeletal  (+) Arthritis ,    Abdominal   Peds  Hematology   Anesthesia Other Findings   Reproductive/Obstetrics                              Anesthesia Physical Anesthesia Plan  ASA: 3  Anesthesia Plan: MAC   Post-op Pain Management: Minimal or no pain anticipated   Induction: Intravenous  PONV Risk Score and Plan: Treatment may vary due to age or medical condition and Propofol  infusion  Airway Management Planned: Natural Airway and Nasal Cannula  Additional Equipment: None  Intra-op Plan:   Post-operative Plan: Extubation in OR  Informed Consent: I have reviewed the patients History and Physical, chart, labs and discussed the procedure including the risks, benefits and alternatives for the proposed anesthesia with the patient or authorized representative who has indicated his/her understanding and acceptance.       Plan Discussed with: CRNA and Surgeon  Anesthesia Plan Comments:         Anesthesia Quick Evaluation

## 2024-01-03 NOTE — Assessment & Plan Note (Addendum)
 No PE on CTA but DVT US  positive for acute DVT of L peroneal veins Started on heparin  given presenting c/o hemoptysis He has done well and will transition to DOAC therapy (cost comparison pending)

## 2024-01-03 NOTE — Interval H&P Note (Signed)
 History and Physical Interval Note:  01/03/2024 12:16 PM  [name] [name]  has presented today for surgery, with the diagnosis of bacteremia.  The various methods of treatment have been discussed with the patient and family. After consideration of risks, benefits and other options for treatment, the patient has consented to  Procedure(s): TRANSESOPHAGEAL ECHOCARDIOGRAM (N/A) as a surgical intervention.  The patient's history has been reviewed, patient examined, no change in status, stable for surgery.  I have reviewed the patient's chart and labs.  Questions were answered to the patient's satisfaction.     Treyshawn Muldrew

## 2024-01-03 NOTE — Assessment & Plan Note (Signed)
 Continue rosuvastatin.

## 2024-01-03 NOTE — Progress Notes (Signed)
 PHARMACY - ANTICOAGULATION CONSULT NOTE  Pharmacy Consult for heparin  Indication: DVT   Allergies  Allergen Reactions   Tea Anaphylaxis   Porcine (Pork) Protein-Containing Drug Products     Patient said he does not eat pork    Patient Measurements: Height: 6' 4 (193 cm) Weight: (!) 181.8 kg (400 lb 12.7 oz) IBW/kg (Calculated) : 86.8 HEPARIN DW (KG): 126.9  Vital Signs: Temp: 99 F (37.2 C) (11/19 2101) BP: 153/104 (11/20 0558) Pulse Rate: 78 (11/19 2101)  Labs: Recent Labs    12/31/23 1818 01/01/24 0600 01/01/24 0600 01/02/24 0038 01/02/24 1045 01/02/24 2213 01/03/24 0820  HGB 16.3 14.6  --  14.8  --   --  15.2  HCT 49.1 43.8  --  45.1  --   --  46.8  PLT 198 198  --  196  --   --  263  HEPARINUNFRC  --   --    < > <0.10* <0.10* 0.23* 0.59  CREATININE 1.06 1.05  --  0.94  --   --   --    < > = values in this interval not displayed.    Estimated Creatinine Clearance: 175.2 mL/min (by C-G formula based on SCr of 0.94 mg/dL).   Medical History: Past Medical History:  Diagnosis Date   Arthritis    Diabetes mellitus without complication (HCC)    Type 2   Hypercholesteremia    Hypertension    Pneumonia    Dec. 2023   Sleep apnea    Stroke (HCC) 2012   Bell's Palsy Deficit. Now resolved   TIA (transient ischemic attack) 2012   Medications:  Not on anticoagulation PTA  Assessment: Patient is a 70 yoM presented to the ED on 11/17 with CP with dyspnea. CT angio without PE, but concerning for pulmonary septic emboli. LE doppler shows acute left DVT. Pharmacy is consulted for heparin management for acute DVT. No bolus per MD initially due to hemoptysis episode 11/18, since no further bleeding, OK to bolus on 11/19 per Dr. Barbarann. Of note, patient does not consume pork for religious reasons but is fine with heparin management after discussion with MD. No anticoagulation PTA.   Today, 01/03/24 Heparin level is 0.59, therapeutic at 3300 units/hr  CBC WNL No  bleeding, infusion related concerns or further hemoptysis reported per RN.   Goal of Therapy:  Heparin level 0.3-0.7 units/ml Monitor platelets by anticoagulation protocol: Yes   Plan:  Continue heparin IV infusion at 3300 units/hr Heparin level in 8 hours due to obesity Continue to monitor CBC and s/sx of bleeding  Follow up plan for TEE today (11/20) Follow up long term anticoagulation plan   Aleck Freiberg, PharmD Candidate 01/03/2024 8:58 AM

## 2024-01-03 NOTE — Assessment & Plan Note (Addendum)
 Body mass index is 48.79 kg/m.[name] [name]  Weight loss should be encouraged on an ongoing basis; he reports that he was previously well over 500 pounds and has lost 200+ pounds through diet and exercise Outpatient PCP/bariatric medicine f/u encouraged Significantly low or high BMI is associated with higher medical risk including morbidity and mortality

## 2024-01-03 NOTE — Progress Notes (Signed)
 Discharge instructions given to patient questions asked and answered.

## 2024-01-03 NOTE — TOC Transition Note (Addendum)
 Transition of Care [facility]) - Discharge Note   Patient Details  Name: [name] [name] MRN: [id number] Date of Birth: 1980/11/20  Transition of Care [facility]) CM/SW Contact:  Sonda Manuella Quill, RN Phone Number: 01/03/2024, 5:18 PM   Clinical Narrative:    D/C orders received; no ins listed; spoke w/ pt in room; pt said he is talking w/ ins company now; pt notified he can obtain free smoking cessation supplies from pumpkinsearch.com.ee or 1-800-QUIT-NOW; pt verbalized he will need to contact agency; pt said he has his other d/c meds at home; pt to also receive 30-day free coupon for Eliquis per pharmacy; no IP CM needs.   Final next level of care: Home/Self Care Barriers to Discharge: No Barriers Identified   Patient Goals and CMS Choice Patient states their goals for this hospitalization and ongoing recovery are:: resides in a private residence          Discharge Placement                       Discharge Plan and Services Additional resources added to the After Visit Summary for                  DME Arranged: N/A DME Agency: NA       HH Arranged: NA HH Agency: NA        Social Drivers of Health (SDOH) Interventions SDOH Screenings   Food Insecurity: No Food Insecurity (01/02/2024)  Housing: Low Risk  (01/02/2024)  Transportation Needs: No Transportation Needs (01/02/2024)  Utilities: Not At Risk (01/02/2024)  Alcohol Screen: Low Risk  (05/21/2023)  Depression (PHQ2-9): Low Risk  (04/16/2018)  Tobacco Use: High Risk (12/31/2023)     Readmission Risk Interventions    01/02/2024    3:03 PM  Readmission Risk Prevention Plan  Post Dischage Appt Complete  Medication Screening Complete  Transportation Screening Complete

## 2024-01-03 NOTE — Transfer of Care (Signed)
 Immediate Anesthesia Transfer of Care Note  Patient: [name] [name]  Procedure(s) Performed: TRANSESOPHAGEAL ECHOCARDIOGRAM  Patient Location: Cath Lab  Anesthesia Type:MAC  Level of Consciousness: awake, alert , and oriented  Airway & Oxygen Therapy: Patient Spontanous Breathing and Patient connected to nasal cannula oxygen  Post-op Assessment: Report given to RN and Post -op Vital signs reviewed and stable  Post vital signs: Reviewed and stable  Last Vitals:  Vitals Value Taken Time  BP 175/100 01/03/24 12:45  Temp 97 1245  Pulse 75 01/03/24 12:46  Resp 13 01/03/24 12:46  SpO2 95 % 01/03/24 12:46  Vitals shown include unfiled device data.  Last Pain:  Vitals:   01/03/24 1243  TempSrc:   PainSc: 0-No pain         Complications: No notable events documented.

## 2024-01-04 ENCOUNTER — Telehealth: Payer: Self-pay

## 2024-01-04 ENCOUNTER — Telehealth: Payer: Self-pay | Admitting: Pharmacist

## 2024-01-04 DIAGNOSIS — I76 Septic arterial embolism: Secondary | ICD-10-CM

## 2024-01-04 NOTE — Progress Notes (Signed)
 Attempt was made to contact patient by phone today for follow up by Clinical Pharmacist regarding patient assistance program for DOAC and smoking cessation. See referral below.   Sent request to PCP for clinical pharmacist referral.  Unable to reach patient. LM on VM with my contact number [phone].  Will also forward to my scheduler to try to outreach patient to make appt.    Rudy Dorothyann DASEN, RPH-CPP  Carla Milling, RPH-CPP Cc: Sherrod Rankin PARAS, [facility]; Barbarann Nest, MD; Osie Iantha SQUIBB, [place] Beckem Tomberlin - a patient of Waddell Mon. Could you connect to support long term Eliquis  access? Appearing as uninsured right now. Looks like also wanted to work on tobacco cessation.  Thank you, Anh and Dr. Barbarann, for this referral!  Catie   ----- Message ----- From: Osie Iantha SQUIBB, [facility] Sent: 01/03/2024   4:44 PM EST To: Nest Barbarann, MD; Rankin PARAS Sherrod, [facility]* Subject: Need financial assistance for DOAC for VTE t*

## 2024-01-04 NOTE — Telephone Encounter (Signed)
 Okay to write a note for hospital admission

## 2024-01-04 NOTE — Telephone Encounter (Unsigned)
 Copied from CRM #[id number]. Topic: General - Other >> Jan 04, 2024  3:30 PM Alfonso HERO wrote: Reason for CRM: patient having issues logging into mychart asking if note can be sent to his email.

## 2024-01-04 NOTE — Telephone Encounter (Signed)
 Letter sent via email to patient per [facility].

## 2024-01-04 NOTE — Telephone Encounter (Signed)
 Copied from CRM #[id number]. Topic: General - Other >> Jan 04, 2024 11:25 AM Thersia BROCKS wrote: Reason for CRM: Patient called in regarding needing a doctor note for work since he was admitted to the hospital, called hospital they stated they was unable to will need to contact primary

## 2024-01-04 NOTE — Transitions of Care (Post Inpatient/ED Visit) (Signed)
 01/04/2024  Name: [name] [name] MRN: [id number] DOB: December 28, 1980  Today's TOC FU Call Status: Today's TOC FU Call Status:: Successful TOC FU Call Completed TOC FU Call Complete Date: 01/04/24  Patient's Name and Date of Birth confirmed. Name, DOB  Transition Care Management Follow-up Telephone Call Date of Discharge: 01/03/24 Discharge Facility: [name]) Type of Discharge: Inpatient Admission Primary Inpatient Discharge Diagnosis:: Septic PE How have you been since you were released from the hospital?: Better Any questions or concerns?: No  Items Reviewed: Did you receive and understand the discharge instructions provided?: Yes Medications obtained,verified, and reconciled?: Yes (Medications Reviewed) Any new allergies since your discharge?: No Dietary orders reviewed?: Yes Type of Diet Ordered:: Low Sodium Heart Healthy Do you have support at home?: Yes People in Home [RPT]: significant other Name of Support/Comfort Primary Source: [name] [name]  Medications Reviewed Today: Medications Reviewed Today     Reviewed by [name] [name] (Case Manager) on 01/04/24 at [phone]  Med List Status: <None>   Medication Order Taking? Sig Documenting Provider Last Dose Status Informant  albuterol  (VENTOLIN  HFA) 108 (90 Base) MCG/ACT inhaler [id number]  Inhale 1-2 puffs into the lungs every 6 (six) hours as needed for wheezing or shortness of breath. [name], [name] A, PA-C  Active Self, Pharmacy Records  amLODipine  (NORVASC ) 5 MG tablet [id number]  TAKE 1 TABLET(5 MG) BY MOUTH DAILY [name] Waddell NOVAK, NP  Active Self, Pharmacy Records  APIXABAN  (ELIQUIS ) VTE STARTER PACK (10MG  AND 5MG ) [id number]  Take as directed on package: start with two-5mg  tablets twice daily for 7 days. On day 8, switch to one-5mg  tablet twice daily. [name] Nest, MD  Active   azelastine  (ASTELIN ) 0.1 % nasal spray [id number]  Place 2 sprays into both nostrils 2 (two) times daily. Use in each nostril as  directed  Patient taking differently: Place 2 sprays into both nostrils as needed for rhinitis or allergies. Use in each nostril as directed   [name] Waddell NOVAK, NP  Active Self, Pharmacy Records  Dulaglutide  (TRULICITY ) 3 MG/0.5ML [name] [name] [id number]  Inject 3 mg into the skin once [name] week. [name] Waddell NOVAK, NP  Active Self, Pharmacy Records  levocetirizine (XYZAL ) 5 MG tablet [id number]  Take 1 tablet (5 mg total) by mouth every evening.  Patient taking differently: Take 5 mg by mouth as needed for allergies.   [name] Waddell NOVAK, NP  Active Self, Pharmacy Records  lisinopril -hydrochlorothiazide  (ZESTORETIC ) 20-12.5 MG tablet [id number]  TAKE 1 TABLET BY MOUTH DAILY [name] Waddell NOVAK, NP  Active Self, Pharmacy Records  nicotine  (NICODERM CQ  - DOSED IN MG/24 HOURS) 14 mg/24hr patch [id number]  Place 1 patch (14 mg total) onto the skin daily as needed (tobacco dependence).  Patient not taking: Reported on 01/04/2024   [name] Nest, MD  Active   rosuvastatin  (CRESTOR ) 20 MG tablet [id number]  Take 1 tablet (20 mg total) by mouth daily. [name] Waddell NOVAK, NP  Active Self, Pharmacy Records            Home Care and Equipment/Supplies: Were Home Health Services Ordered?: No Any new equipment or medical supplies ordered?: No  Functional Questionnaire: Do you need assistance with bathing/showering or dressing?: No Do you need assistance with meal preparation?: No Do you need assistance with eating?: No Do you have difficulty maintaining continence: No Do you need assistance with getting out of bed/getting out of [name] chair/moving?: No Do you have difficulty managing or taking your medications?: No  Follow up appointments reviewed: PCP Follow-up  appointment confirmed?: Yes Date of PCP follow-up appointment?: 01/14/24 Follow-up Provider: Waddell Mon Specialist [facility] Follow-up appointment confirmed?: Yes Date of Specialist follow-up appointment?: 01/09/24 Follow-Up Specialty Provider:: Xyjapa Dgayli,  Pulmonolgy Do you need transportation to your follow-up appointment?: No Do you understand care options if your condition(s) worsen?: Yes-patient verbalized understanding  SDOH Interventions Today    Flowsheet Row Most Recent Value  SDOH Interventions   Food Insecurity Interventions Intervention Not Indicated  Housing Interventions Intervention Not Indicated  Transportation Interventions Intervention Not Indicated  Utilities Interventions Intervention Not Indicated    Medford Balboa, BSN, [name] [name] [name]  VBCI - [facility] [name] Care Manager [phone]

## 2024-01-07 ENCOUNTER — Other Ambulatory Visit (HOSPITAL_COMMUNITY): Payer: Self-pay

## 2024-01-07 LAB — CULTURE, BLOOD (ROUTINE X 2)
Culture: NO GROWTH
Culture: NO GROWTH
Special Requests: ADEQUATE
Special Requests: ADEQUATE

## 2024-01-08 ENCOUNTER — Telehealth: Payer: Self-pay

## 2024-01-08 NOTE — Progress Notes (Signed)
 Complex Care Management Care Guide Note  01/08/2024 Name: [name] [name] MRN: [id number] DOB: May 10, 1980  [name] [name] is a 43 y.o. year old male who is a primary care patient of Almarie Waddell NOVAK, NP and is actively engaged with the care management team. I reached out to [name] [name] by phone today to assist with re-scheduling  with the Pharmacist.  Follow up plan: Telephone appointment with complex care management team member scheduled for:  01/14/24 at 1:00 p.m.   Dreama [facility]  [facility], [facility] VBCI Assistant Direct Dial: [phone]  Fax: [phone]

## 2024-01-09 ENCOUNTER — Ambulatory Visit: Payer: Self-pay | Admitting: Student in an Organized Health Care Education/Training Program

## 2024-01-09 LAB — FUNGITELL BETA-D-GLUCAN: Fungitell Value:: <31.25 pg/mL

## 2024-01-13 ENCOUNTER — Other Ambulatory Visit: Payer: Self-pay | Admitting: Family Medicine

## 2024-01-13 DIAGNOSIS — E1165 Type 2 diabetes mellitus with hyperglycemia: Secondary | ICD-10-CM

## 2024-01-14 ENCOUNTER — Inpatient Hospital Stay: Payer: Self-pay | Admitting: Family Medicine

## 2024-01-14 ENCOUNTER — Other Ambulatory Visit (HOSPITAL_COMMUNITY): Payer: Self-pay

## 2024-01-14 ENCOUNTER — Other Ambulatory Visit: Payer: Self-pay | Admitting: Pharmacist

## 2024-01-14 DIAGNOSIS — E1165 Type 2 diabetes mellitus with hyperglycemia: Secondary | ICD-10-CM

## 2024-01-14 MED ORDER — ROSUVASTATIN CALCIUM 20 MG PO TABS
20.0000 mg | ORAL_TABLET | Freq: Every day | ORAL | 0 refills | Status: AC
  Filled 2024-01-14: qty 90, 90d supply, fill #0

## 2024-01-14 MED ORDER — OZEMPIC (0.25 OR 0.5 MG/DOSE) 2 MG/3ML ~~LOC~~ SOPN: 0.5000 mg | PEN_INJECTOR | SUBCUTANEOUS | Status: AC

## 2024-01-14 NOTE — Progress Notes (Incomplete)
 Established Patient Office Visit  Subjective:  Patient ID: [name] [name], male    DOB: [birth year]  Age: 43 y.o. MRN: [id number]  CC: No chief complaint on file.     HPI [name] [name] is here for Hospital Follow-up. Seen in emergency department with complaints of dyspnea and chest pain. Labs abnormal with leukocytosis and mild hyperglycemia. CXR noted developing patchy airspace opacity in right lung. Concern for septic pulmonary emboli, thus was started on antibiotics.    Past Medical History:  Diagnosis Date   Arthritis    Diabetes mellitus without complication (HCC)    Type 2   Hypercholesteremia    Hypertension    Pneumonia    Dec. 2023   Sleep apnea    Stroke (HCC) 2012   Bell's Palsy Deficit. Now resolved   TIA (transient ischemic attack) 2012    Past Surgical History:  Procedure Laterality Date   FOOT SURGERY Left 2004   KNEE SURGERY Left 2004   ORIF RADIUS & ULNA FRACTURES Left 2004   ORIF TIBIA & FIBULA FRACTURES Left 2004   ORTHOPEDIC SURGERY     PILONIDAL CYST EXCISION N/A 06/29/2022   Procedure: CYST EXCISION PILONIDAL EXTENSIVE;  Surgeon: [name] Ned, MD;  Location: [facility] OR;  Service: General;  Laterality: N/A;   SHOULDER SURGERY     TRANSESOPHAGEAL ECHOCARDIOGRAM (CATH LAB) N/A 01/03/2024   Procedure: TRANSESOPHAGEAL ECHOCARDIOGRAM;  Surgeon: [name] Headland, MD;  Location: [facility] INVASIVE CV LAB;  Service: Cardiovascular;  Laterality: N/A;    Family History  Problem Relation Age of Onset   Diabetes Mother    Healthy Father     Social History   Socioeconomic History   Marital status: Significant Other    Spouse name: Not on file   Number of children: Not on file   Years of education: Not on file   Highest education level: Not on file  Occupational History   Not on file  Tobacco Use   Smoking status: Some Days    Types: Cigars   Smokeless tobacco: Never  Vaping Use   Vaping status: Never Used  Substance and Sexual Activity    Alcohol use: Yes    Comment: occasionally   Drug use: No    Comment: UDS positive histocially   Sexual activity: Yes    Birth control/protection: None  Other Topics Concern   Not on file  Social History Narrative   Not on file   Social Drivers of Health   Financial Resource Strain: Not on file  Food Insecurity: No Food Insecurity (01/04/2024)   Hunger Vital Sign    Worried About Running Out of Food in the Last Year: Never true    Ran Out of Food in the Last Year: Never true  Transportation Needs: No Transportation Needs (01/04/2024)   PRAPARE - Administrator, Civil Service (Medical): No    Lack of Transportation (Non-Medical): No  Physical Activity: Not on file  Stress: Not on file  Social Connections: Not on file  Intimate Partner Violence: Not At Risk (01/04/2024)   Humiliation, Afraid, Rape, and Kick questionnaire    Fear of Current or Ex-Partner: No    Emotionally Abused: No    Physically Abused: No    Sexually Abused: No    ROS All ROS negative except what is listed in the HPI.   Objective:   Today's Vitals: There were no vitals taken for this visit.  Physical Exam  Assessment & Plan:  Problem List Items Addressed This Visit   None     Follow-up: No follow-ups on file.   [name] FURY Almarie, DNP, FNP-C  I,Emily Lagle,acting as a neurosurgeon for [name] KATHEE Almarie, NP.,have documented all relevant documentation on the behalf of [name] KATHEE Almarie, NP.  I, [name] KATHEE Almarie, NP, have reviewed all documentation for this visit. The documentation on 01/14/2024 for the exam, diagnosis, procedures, and orders are all accurate and complete.

## 2024-01-14 NOTE — Progress Notes (Signed)
 Patient has lost his insurance but is working to get re-instated. Discussed with PCP and she approved 46 DS / no refill until patient can get insurance issues resolved.

## 2024-01-14 NOTE — Progress Notes (Signed)
 01/14/2024 Name: [name] [name] MRN: [id number] DOB: November 29, 1980  Chief Complaint  Patient presents with   Medication Management    [name] [name] is [name] 43 y.o. year old male who presented for [name] telephone visit.   They were referred to the pharmacist by their PCP for assistance in managing medication access.    Subjective:  Care Team: Primary Care Provider: Almarie Waddell NOVAK, NP ; Next Scheduled Visit: he was scheduled to see PCP today but patient has lost inusarnce coverage. He is working to get re-instated but he was not able to afford to pay out of pocket for visit today.   Medication Access/Adherence  Current Pharmacy:  [id number] [place], [place] - [address] AT [facility] OF [facility] [address] [place] [address] Phone: [phone] Fax: [phone]  [facility] [address] [place] - [address] [place] [address] Phone: [phone] Fax: [phone]  [name] [name] - [facility] [address] [place] [address] Phone: [phone] Fax: [phone]   Patient reports affordability concerns with their medications: Yes  Patient reports access/transportation concerns to their pharmacy: No  Patient reports adherence concerns with their medications:  Yes  - needs updated Rx for rosuvastatin    Medication Management:  Patient is worried about being able to afford his medications since he has lost insurance.  He endorses that he will need the following filled - Trulicity , lisinopril  HCT, rosuvastatin  and amlodipine .  Patient states he has 1 month of Eliquis  on hand.   He asks if I have [name] discount card to get Trulicity  for free.    Objective:  Lab Results  Component Value Date   HGBA1C 6.4 09/26/2023    Lab Results  Component Value Date   CREATININE 1.02 01/03/2024   BUN 10 01/03/2024   NA 137 01/03/2024   K 3.8 01/03/2024   CL 104 01/03/2024    CO2 23 01/03/2024    Lab Results  Component Value Date   CHOL 114 09/26/2023   HDL 40 09/26/2023   LDLCALC 53 09/26/2023   TRIG 133 09/26/2023   CHOLHDL 2.9 09/26/2023    Medications Reviewed Today     Reviewed by [name] [name] (Pharmacist) on 01/14/24 at 1326  Med List Status: <None>   Medication Order Taking? Sig Documenting Provider Last Dose Status Informant  albuterol  (VENTOLIN  HFA) 108 (90 Base) MCG/ACT inhaler [id number]  Inhale 1-2 puffs into the lungs every 6 (six) hours as needed for wheezing or shortness of breath. Henderly, Britni A, PA-C  Active Self, Pharmacy Records  amLODipine  (NORVASC ) 5 MG tablet [id number] Yes TAKE 1 TABLET(5 MG) BY MOUTH DAILY [name] Waddell NOVAK, NP  Active Self, Pharmacy Records  APIXABAN  (ELIQUIS ) VTE STARTER PACK (10MG  AND 5MG ) [id number] Yes Take as directed on package: start with two-5mg  tablets twice daily for 7 days. On day 8, switch to one-5mg  tablet twice daily. [name] Nest, MD  Active   azelastine  (ASTELIN ) 0.1 % nasal spray [id number]  Place 2 sprays into both nostrils 2 (two) times daily. Use in each nostril as directed  Patient taking differently: Place 2 sprays into both nostrils as needed for rhinitis or allergies. Use in each nostril as directed   [name] Waddell NOVAK, NP  Active Self, Pharmacy Records  Dulaglutide  (TRULICITY ) 3 MG/0.5ML EMMANUEL [id number] Yes Inject 3 mg into the skin once [name] week. [name] Waddell NOVAK, NP  Active Self, Pharmacy  Records  levocetirizine (XYZAL ) 5 MG tablet [id number] Yes Take 1 tablet (5 mg total) by mouth every evening.  Patient taking differently: Take 5 mg by mouth as needed for allergies.   [name] Waddell NOVAK, NP  Active Self, Pharmacy Records  lisinopril -hydrochlorothiazide  (ZESTORETIC ) 20-12.5 MG tablet [id number] Yes TAKE 1 TABLET BY MOUTH DAILY [name] Waddell NOVAK, NP  Active Self, Pharmacy Records  nicotine  (NICODERM CQ  - DOSED IN MG/24 HOURS) 14 mg/24hr patch [id number]  Place 1 patch (14 mg total) onto the skin  daily as needed (tobacco dependence).  Patient not taking: Reported on 01/04/2024   [name] Nest, MD  Active   rosuvastatin  (CRESTOR ) 20 MG tablet [id number] Yes Take 1 tablet (20 mg total) by mouth daily. [name] Waddell NOVAK, NP  Active Self, Pharmacy Records              Assessment/Plan:   Medication Management: Currently strategy insufficient to maintain appropriate adherence to prescribed medication regimen - patient has lost insurance - Discussed cost of Trulicity . I do not have [name] card that would provide Trulicity  for free but would only lower cost about $150 / month. Screened patient for [facility] patient assistance program but his household income was above program cut off. Discussed with his PCP and will provide 1 month of Ozempic  sample - inject 0.5mg  weeky.  - Patient can get the following for $5 / 30 days or $15 / 90 days at [facility] pharmacy - rosuvastatin , lisinopril  HCT and amlodipine . He requested transfer from [facility] to [facility] - sent message to [facility].    Follow Up Plan: 1 month  [name] [name], PharmD Clinical Pharmacist [facility]

## 2024-01-14 NOTE — Addendum Note (Signed)
 Addended by: CARLA MILLING B on: 01/14/2024 02:36 PM   Modules accepted: Orders

## 2024-01-14 NOTE — Telephone Encounter (Signed)
Holding for appt today 

## 2024-01-15 ENCOUNTER — Other Ambulatory Visit (HOSPITAL_COMMUNITY): Payer: Self-pay

## 2024-01-15 MED FILL — Amlodipine Besylate Tab 5 MG (Base Equivalent): ORAL | 90 days supply | Qty: 90 | Fill #0 | Status: AC

## 2024-01-15 MED FILL — Lisinopril & Hydrochlorothiazide Tab 20-12.5 MG: ORAL | 90 days supply | Qty: 90 | Fill #0 | Status: AC

## 2024-01-22 ENCOUNTER — Inpatient Hospital Stay: Payer: Self-pay | Admitting: Internal Medicine

## 2024-01-30 ENCOUNTER — Telehealth: Payer: Self-pay

## 2024-01-30 NOTE — Telephone Encounter (Signed)
 Copied from CRM [id number]. Topic: Clinical - Medication Question >> Jan 30, 2024  2:13 PM Paige D wrote: Reason for CRM: pt is calling in regards to Elaquis samples he suppose to be picking up. Called [facility] stated they were going tot alk to miss Almarie about it and see if pt can pick up samples today. Please reach out to pt when able to pick up elaquis samples  Call back  [phone]

## 2024-01-30 NOTE — Telephone Encounter (Signed)
 Looks like spoke with Tammy on 01/14/2024 and it states he has a month of Eliquis .   Okay to give samples?

## 2024-01-30 NOTE — Telephone Encounter (Signed)
 Spoke with patient, made aware samples at the front for pick up.

## 2024-01-30 NOTE — Telephone Encounter (Signed)
 Yes, that's fine.

## 2024-02-20 ENCOUNTER — Other Ambulatory Visit: Payer: Self-pay | Admitting: Pharmacist

## 2024-02-20 MED ORDER — APIXABAN 2.5 MG PO TABS: 5.0000 mg | ORAL_TABLET | Freq: Two times a day (BID) | ORAL | Status: AC

## 2024-02-20 NOTE — Progress Notes (Signed)
 "  02/20/2024 Name: [name] [name] MRN: [id number] DOB: Jun 09, 1980  Chief Complaint  Patient presents with   Medication Management   Medication Adherence    [name] [name] is a 44 y.o. year old male who presented for a telephone visit.   They were referred to the pharmacist by their PCP for assistance in managing medication access.    Subjective:  Care Team: Primary Care Provider: Almarie Waddell NOVAK, NP ; Next Scheduled Visit: he was scheduled to see PCP in December 2025 but lost inusarnce coverage. Today he reports he should have coverage starting 02/29/2024.   Medication Access/Adherence  Current Pharmacy:  [facility] #[id number] [place], [place] - [address] AT [facility] OF [facility] [address] [place] [address] Phone: [phone] Fax: [phone]  [facility] [address], [place] - [address] [place] [address] Phone: [phone] Fax: [phone]  DARRYLE LONG - [facility] [address] [place] [address] Phone: [phone] Fax: [phone]   Patient reports affordability concerns with their medications: Yes  Patient reports access/transportation concerns to their pharmacy: No  Patient reports adherence concerns with their medications:  Yes  - he took last Ozempic  dose today and has  5 or 6 days of Eliquis  left.     He was able to get all generic medication refilled in December at [facility] for around $15 to $30 each.   Objective:  Lab Results  Component Value Date   HGBA1C 6.4 09/26/2023    Lab Results  Component Value Date   CREATININE 1.02 01/03/2024   BUN 10 01/03/2024   NA 137 01/03/2024   K 3.8 01/03/2024   CL 104 01/03/2024   CO2 23 01/03/2024    Lab Results  Component Value Date   CHOL 114 09/26/2023   HDL 40 09/26/2023   LDLCALC 53 09/26/2023   TRIG 133 09/26/2023   CHOLHDL 2.9 09/26/2023    Medications Reviewed Today      Reviewed by Carla Milling, RPH-CPP (Pharmacist) on 02/20/24 at 1443  Med List Status: <None>   Medication Order Taking? Sig Documenting Provider Last Dose Status Informant  albuterol  (VENTOLIN  HFA) 108 (90 Base) MCG/ACT inhaler [id number]  Inhale 1-2 puffs into the lungs every 6 (six) hours as needed for wheezing or shortness of breath. Henderly, Britni A, PA-C  Active Self, Pharmacy Records  amLODipine  (NORVASC ) 5 MG tablet [id number] Yes Take 1 tablet (5 mg total) by mouth daily. Almarie Waddell NOVAK, NP  Active Self, Pharmacy Records  apixaban  (ELIQUIS ) 5 MG TABS tablet [id number] Yes Take 1 tablet (5 mg total) by mouth 2 (two) times daily. Almarie Waddell NOVAK, NP  Active     Discontinued 02/20/24 1442 (Change in therapy)   azelastine  (ASTELIN ) 0.1 % nasal spray [id number]  Place 2 sprays into both nostrils 2 (two) times daily. Use in each nostril as directed  Patient taking differently: Place 2 sprays into both nostrils as needed for rhinitis or allergies. Use in each nostril as directed   Almarie Waddell NOVAK, NP  Active Self, Pharmacy Records  Dulaglutide  (TRULICITY ) 3 MG/0.5ML SOAJ [id number]  Inject 3 mg into the skin once a week.  Patient not taking: Reported on 02/20/2024   Almarie Waddell NOVAK, NP  Active Self, Pharmacy Records           Med Note [employer], MILLING NOVAK [place] Jan 14, 2024  2:55 PM) On  hold due to cost - using sample of Ozempic  until insurance re-instated.   levocetirizine (XYZAL ) 5 MG tablet [id number] Yes Take 1 tablet (5 mg total) by mouth every evening.  Patient taking differently: Take 5 mg by mouth as needed for allergies.   Almarie Waddell NOVAK, NP  Active Self, Pharmacy Records  lisinopril -hydrochlorothiazide  (ZESTORETIC ) 20-12.5 MG tablet [id number] Yes TAKE 1 TABLET BY MOUTH DAILY Almarie Waddell NOVAK, NP  Active Self, Pharmacy Records  nicotine  (NICODERM CQ  - DOSED IN MG/24 HOURS) 14 mg/24hr patch [id number]  Place 1 patch (14 mg total) onto the skin daily as needed (tobacco dependence).  Patient not  taking: Reported on 01/04/2024   Barbarann Nest, MD  Active   rosuvastatin  (CRESTOR ) 20 MG tablet [id number] Yes Take 1 tablet (20 mg total) by mouth daily. Almarie Waddell NOVAK, NP  Active   Semaglutide ,0.25 or 0.5MG /DOS, (OZEMPIC , 0.25 OR 0.5 MG/DOSE,) 2 MG/3ML SOPN [id number] Yes Inject 0.5 mg into the skin once a week. Almarie Waddell NOVAK, NP  Active               Assessment/Plan:   Medication Management: Current strategy insufficient to maintain appropriate adherence to prescribed medication regimen - patient has lost insurance - Provided patient with 4 doses of Ozempic  sample - continue 0.5mg  weekly - Office did not have Eliquis  5mg  samples. We did have Eliquis  2.5mg  dose. He will take 2 tablets = 5mg  twice a day. Provided 56 tablet which will last 14 days.   Follow Up Plan: 2 to 3 weeks to check to see if he has insurance coverage.   Madelin Ray, PharmD Clinical Pharmacist [facility]    "

## 2024-02-28 ENCOUNTER — Ambulatory Visit: Payer: Self-pay | Admitting: Pulmonary Disease

## 2024-03-05 ENCOUNTER — Telehealth: Payer: Self-pay | Admitting: Pharmacist

## 2024-03-05 ENCOUNTER — Other Ambulatory Visit: Payer: Self-pay

## 2024-03-05 NOTE — Telephone Encounter (Signed)
 Attempt was made to contact patient by phone today for follow up by Clinical Pharmacist regarding medication cost.  Unable to reach patient. LM on VM with my contact number [phone].   Patient was suppose to get insurance coverage starting 02/29/2024. He was provided with 14 days of Eliquis  2.5mg  samples 02/20/2024. Asked that he call office if he needs more samples. We should have the 5mg  strength now.
